# Patient Record
Sex: Female | Born: 1960
Health system: Southern US, Community
[De-identification: ages and names within clinical notes are randomized; demographics above are authoritative.]

## PROBLEM LIST (undated history)

## (undated) DIAGNOSIS — R112 Nausea with vomiting, unspecified: Secondary | ICD-10-CM

## (undated) DIAGNOSIS — E079 Disorder of thyroid, unspecified: Secondary | ICD-10-CM

## (undated) DIAGNOSIS — Z9889 Other specified postprocedural states: Secondary | ICD-10-CM

## (undated) DIAGNOSIS — E0590 Thyrotoxicosis, unspecified without thyrotoxic crisis or storm: Secondary | ICD-10-CM

## (undated) DIAGNOSIS — F419 Anxiety disorder, unspecified: Secondary | ICD-10-CM

## (undated) DIAGNOSIS — R000 Tachycardia, unspecified: Secondary | ICD-10-CM

## (undated) DIAGNOSIS — R569 Unspecified convulsions: Secondary | ICD-10-CM

## (undated) DIAGNOSIS — R87619 Unspecified abnormal cytological findings in specimens from cervix uteri: Secondary | ICD-10-CM

## (undated) DIAGNOSIS — J449 Chronic obstructive pulmonary disease, unspecified: Secondary | ICD-10-CM

## (undated) DIAGNOSIS — D649 Anemia, unspecified: Secondary | ICD-10-CM

## (undated) DIAGNOSIS — H538 Other visual disturbances: Secondary | ICD-10-CM

## (undated) DIAGNOSIS — H6690 Otitis media, unspecified, unspecified ear: Secondary | ICD-10-CM

## (undated) DIAGNOSIS — R51 Headache: Secondary | ICD-10-CM

## (undated) DIAGNOSIS — Z87898 Personal history of other specified conditions: Secondary | ICD-10-CM

## (undated) DIAGNOSIS — R002 Palpitations: Secondary | ICD-10-CM

## (undated) DIAGNOSIS — H9201 Otalgia, right ear: Secondary | ICD-10-CM

## (undated) HISTORY — DX: Thyrotoxicosis, unspecified without thyrotoxic crisis or storm: E05.90

## (undated) HISTORY — DX: Palpitations: R00.2

## (undated) HISTORY — PX: INNER EAR SURGERY: SHX679

## (undated) HISTORY — DX: Otalgia, right ear: H92.01

## (undated) HISTORY — DX: Otitis media, unspecified, unspecified ear: H66.90

## (undated) HISTORY — DX: Other visual disturbances: H53.8

## (undated) HISTORY — DX: Personal history of other specified conditions: Z87.898

## (undated) HISTORY — PX: CERVICAL CONE BIOPSY: SUR198

## (undated) HISTORY — DX: Headache: R51

---

## 1991-07-05 HISTORY — PX: TUBAL LIGATION: SHX77

## 1997-09-20 ENCOUNTER — Emergency Department (HOSPITAL_COMMUNITY): Admission: EM | Admit: 1997-09-20 | Discharge: 1997-09-20 | Payer: Self-pay | Admitting: Internal Medicine

## 1997-09-23 ENCOUNTER — Inpatient Hospital Stay (HOSPITAL_COMMUNITY): Admission: EM | Admit: 1997-09-23 | Discharge: 1997-09-26 | Payer: Self-pay | Admitting: Internal Medicine

## 1998-08-19 ENCOUNTER — Encounter: Payer: Self-pay | Admitting: Emergency Medicine

## 1998-08-19 ENCOUNTER — Emergency Department (HOSPITAL_COMMUNITY): Admission: EM | Admit: 1998-08-19 | Discharge: 1998-08-19 | Payer: Self-pay | Admitting: Emergency Medicine

## 1998-08-21 ENCOUNTER — Emergency Department (HOSPITAL_COMMUNITY): Admission: EM | Admit: 1998-08-21 | Discharge: 1998-08-21 | Payer: Self-pay | Admitting: Emergency Medicine

## 1998-09-04 ENCOUNTER — Emergency Department (HOSPITAL_COMMUNITY): Admission: EM | Admit: 1998-09-04 | Discharge: 1998-09-04 | Payer: Self-pay | Admitting: Emergency Medicine

## 1998-11-16 ENCOUNTER — Encounter: Payer: Self-pay | Admitting: Family Medicine

## 1998-11-16 ENCOUNTER — Ambulatory Visit (HOSPITAL_COMMUNITY): Admission: RE | Admit: 1998-11-16 | Discharge: 1998-11-16 | Payer: Self-pay | Admitting: Family Medicine

## 1999-06-16 ENCOUNTER — Emergency Department (HOSPITAL_COMMUNITY): Admission: EM | Admit: 1999-06-16 | Discharge: 1999-06-16 | Payer: Self-pay

## 1999-11-25 ENCOUNTER — Emergency Department (HOSPITAL_COMMUNITY): Admission: EM | Admit: 1999-11-25 | Discharge: 1999-11-25 | Payer: Self-pay | Admitting: Emergency Medicine

## 1999-11-25 ENCOUNTER — Encounter: Payer: Self-pay | Admitting: Emergency Medicine

## 2000-01-12 ENCOUNTER — Emergency Department (HOSPITAL_COMMUNITY): Admission: EM | Admit: 2000-01-12 | Discharge: 2000-01-12 | Payer: Self-pay | Admitting: Emergency Medicine

## 2000-07-23 ENCOUNTER — Inpatient Hospital Stay (HOSPITAL_COMMUNITY): Admission: EM | Admit: 2000-07-23 | Discharge: 2000-07-25 | Payer: Self-pay

## 2000-07-23 ENCOUNTER — Encounter: Payer: Self-pay | Admitting: Emergency Medicine

## 2000-07-24 ENCOUNTER — Encounter: Payer: Self-pay | Admitting: Emergency Medicine

## 2000-07-25 ENCOUNTER — Encounter: Payer: Self-pay | Admitting: Internal Medicine

## 2001-03-31 ENCOUNTER — Encounter: Payer: Self-pay | Admitting: Emergency Medicine

## 2001-03-31 ENCOUNTER — Inpatient Hospital Stay (HOSPITAL_COMMUNITY): Admission: EM | Admit: 2001-03-31 | Discharge: 2001-04-04 | Payer: Self-pay | Admitting: Emergency Medicine

## 2001-04-03 ENCOUNTER — Encounter: Payer: Self-pay | Admitting: Family Medicine

## 2001-04-26 ENCOUNTER — Encounter: Admission: RE | Admit: 2001-04-26 | Discharge: 2001-04-26 | Payer: Self-pay | Admitting: Family Medicine

## 2001-07-14 ENCOUNTER — Emergency Department (HOSPITAL_COMMUNITY): Admission: EM | Admit: 2001-07-14 | Discharge: 2001-07-14 | Payer: Self-pay | Admitting: Emergency Medicine

## 2001-07-14 ENCOUNTER — Encounter: Payer: Self-pay | Admitting: Emergency Medicine

## 2003-12-29 ENCOUNTER — Emergency Department (HOSPITAL_COMMUNITY): Admission: EM | Admit: 2003-12-29 | Discharge: 2003-12-29 | Payer: Self-pay | Admitting: Emergency Medicine

## 2004-06-13 ENCOUNTER — Emergency Department (HOSPITAL_COMMUNITY): Admission: EM | Admit: 2004-06-13 | Discharge: 2004-06-13 | Payer: Self-pay | Admitting: Emergency Medicine

## 2004-10-18 ENCOUNTER — Emergency Department (HOSPITAL_COMMUNITY): Admission: EM | Admit: 2004-10-18 | Discharge: 2004-10-18 | Payer: Self-pay | Admitting: Emergency Medicine

## 2005-10-31 ENCOUNTER — Emergency Department (HOSPITAL_COMMUNITY): Admission: EM | Admit: 2005-10-31 | Discharge: 2005-10-31 | Payer: Self-pay | Admitting: Emergency Medicine

## 2006-04-12 ENCOUNTER — Emergency Department (HOSPITAL_COMMUNITY): Admission: EM | Admit: 2006-04-12 | Discharge: 2006-04-12 | Payer: Self-pay | Admitting: Family Medicine

## 2007-08-27 ENCOUNTER — Emergency Department (HOSPITAL_COMMUNITY): Admission: EM | Admit: 2007-08-27 | Discharge: 2007-08-27 | Payer: Self-pay | Admitting: Emergency Medicine

## 2007-12-04 ENCOUNTER — Emergency Department (HOSPITAL_COMMUNITY): Admission: EM | Admit: 2007-12-04 | Discharge: 2007-12-04 | Payer: Self-pay | Admitting: Emergency Medicine

## 2009-01-19 ENCOUNTER — Emergency Department (HOSPITAL_COMMUNITY): Admission: EM | Admit: 2009-01-19 | Discharge: 2009-01-19 | Payer: Self-pay | Admitting: Emergency Medicine

## 2010-03-15 ENCOUNTER — Emergency Department (HOSPITAL_COMMUNITY)
Admission: EM | Admit: 2010-03-15 | Discharge: 2010-03-15 | Payer: Self-pay | Source: Home / Self Care | Admitting: Emergency Medicine

## 2010-06-09 LAB — COMPREHENSIVE METABOLIC PANEL
ALT: 14 U/L (ref 0–35)
AST: 19 U/L (ref 0–37)
Alkaline Phosphatase: 58 U/L (ref 39–117)
CO2: 25 mEq/L (ref 19–32)
Chloride: 105 mEq/L (ref 96–112)
Creatinine, Ser: 0.61 mg/dL (ref 0.4–1.2)
GFR calc Af Amer: >60 mL/min (ref >60)
GFR calc non Af Amer: >60 mL/min (ref >60)
Potassium: 3.4 mEq/L — ABNORMAL LOW (ref 3.5–5.1)
Sodium: 137 mEq/L (ref 135–145)
Total Bilirubin: 0.4 mg/dL (ref 0.3–1.2)

## 2010-06-09 LAB — URINALYSIS, ROUTINE W REFLEX MICROSCOPIC
Glucose, UA: NEGATIVE mg/dL
Protein, ur: NEGATIVE mg/dL
pH: 6 (ref 5.0–8.0)

## 2010-06-09 LAB — URINE MICROSCOPIC-ADD ON

## 2010-06-09 LAB — DIFFERENTIAL
Basophils Absolute: 0 10*3/uL (ref 0.0–0.1)
Basophils Relative: 1 % (ref 0–1)
Eosinophils Absolute: 0.1 10*3/uL (ref 0.0–0.7)
Eosinophils Relative: 2 % (ref 0–5)

## 2010-06-09 LAB — CBC
MCV: 89.6 fL (ref 78.0–100.0)
RBC: 4.08 MIL/uL (ref 3.87–5.11)
WBC: 6.5 10*3/uL (ref 4.0–10.5)

## 2010-06-09 LAB — URINE CULTURE: Colony Count: 75000

## 2010-07-23 NOTE — Discharge Summary (Signed)
[place]. [facility]  Patient:    Jennifer Stanton, Jennifer Stanton Visit Number: [id number] MRN: [id number]          Service Type: MED Location: [phone] Attending Physician:  Doneta Public Dictated by:   Lucille Passy, M.D. Admit Date:  03/31/2001 Discharge Date: 04/04/2001   CC:         [facility] in [place], primary doctor             Dr. London Sheer, ophthalmology, [place], [place]                           Discharge Summary  DATE OF BIRTH:  [birth year].  DISCHARGE DIAGNOSES: 1. Pyelonephritis. 2. Nausea and vomiting. 3. Diplopia. 4. Tobacco abuse.  DISCHARGE MEDICATIONS: 1. Bactrim DS one p.o. b.i.d. x9 days. 2. Phenergan 25 mg suppositories one p.r. q.4h. p.r.n. nausea and vomiting.  DISCHARGE INSTRUCTIONS:  Activity as tolerated.  The patient as instructed to walk with assistance if she is feeling lightheaded.  She was advised to quit smoking as this is generally bad for her health.  Diet as tolerated.  She was advised to return to [facility] or the [employer] Emergency Room if she is unable to keep any food or liquid down or for increasing pain.  FOLLOW-UP: 1. [facility] within two weeks. 2. The patient was advised to go and see her eye doctor, Dr. London Sheer,    within two weeks of discharge to insure that her vision is normal and that    she has the correct prescription for her glasses.  BRIEF HOSPITAL COURSE:  [name] is a 50 year old Caucasian female who presented to the [place] ER with a four-day history of nausea and vomiting as well as one episode of emesis on the day of admission when she became dizzy and fell in the bathroom, hitting her head on the floor on her right temporal area.  She also complained of low back pain over the last 24 hours prior to admission.  See dictated history and physical for full details.  ADMISSION LABS:  Temperature on admission  103. Potassium low at 3.2, other electrolytes and liver function tests within normal limits.  Amylase and lipase within normal limits.  Hemoglobin 9.2, MCV 88.6, white blood cell count 9.2 with an elevated neutrophil percent at 79.  Urinalysis which was a clean catch revealed rare epithelial cells, 20 to 50 white blood cells, many bacteria, positive leukocyte esterase, and nitrites.  Urine pregnancy test negative.  Admission head CT was negative.  Admission chest x-ray was negative.  #1 - PYELONEPHRITIS:  The patient was initially placed on IV Cipro which was later changed to oral Bactrim when the patients urine culture revealed greater than 100,000 colonies of E. coli which was pan sensitive.  She completed five days of antibiotics during the hospitalization and will complete 14-day total course on discharge. She continued to have only low grade fevers during hospitalization.  Her white blood cell count decreased to 5.6 on the day of discharge. She was also treated with IV fluids.  Blood cultures were negative by the time of dictation. She had excellent urine output throughout admission.  By the day of discharge she was tolerating a regular diet with only a small amount of emesis and was off IV fluids and taking oral antibiotics and was therefore ready for discharge.  #2 - NAUSEA AND  VOMITING:  The patient states she has had problems with nausea and vomiting over the past two weeks but has never had problems before that. Her episodes of emesis are only productive of a small amount of stomach contents and she had excellent urine output throughout hospitalization even when her IV fluids were stopped.  Her nausea and vomiting was overall adequately controlled with Phenergan suppositories which she will be given on discharge.  Her abdominal exam revealed only CVA tenderness with moderate left-sided abdominal pain during hospitalization and her liver function tests on admission were all  within normal limits.  This nausea and vomiting may be residual effect of her pyelonephritis or it may also likely be functional or psychosomatic.  #3 - DIPLOPIA:  The patient reported double vision in her right eye since her syncopal episode prior to coming to the hospital.  She did hit the right side of her head during that syncopal episode and complained of a frontal headache for the first several days of admission.  A head CT on admission was negative as mentioned above and a repeat head CT on hospital day #4 was also negative for any intracranial bleed.  An ophthalmology consult was obtained from Guadelupe Sabin, M.D., who examined and evaluated the patient.  He did not appreciate any retinal tear or detachment and simply found that her vision is 21/50 in her right eye and 20/30 in her left eye.  He did not appreciate any ocular trauma and recommended that the patient see her local eye doctor, Dr. London Sheer, for follow-up after discharge.  The patient did not have her glasses during hospitalization and this may account for some of her double vision.  #4 - TOBACCO ABUSE:  The patient was advised to quit smoking during hospitalization.  DISCHARGE LABS:  Sodium 138, potassium 4.4, chloride 100, CO2 31, BUN 10, creatinine 0.9, glucose 101, calcium 9.6.  White blood cell count 5.6, hemoglobin 12.5, platelets 397; differential negative. Dictated by:   Lucille Passy, M.D. Attending Physician:  Doneta Public DD:  04/04/01 TD:  04/04/01 Job: [id number]
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
## 2010-07-23 NOTE — Discharge Summary (Signed)
[place]. [facility]  Patient:    Jennifer Stanton, Jennifer Stanton                        MRN: [id number] Adm. Date:  [id number] Disc. Date: [id number] Attending:  Alfonso Ramus Dictator:   Janyce Llanos, M.D. CC:         Edwena Felty. Ashley Royalty, M.D.   Discharge Summary  DISCHARGE DIAGNOSES: 1. Left ovarian cyst with nausea and vomiting. 2. Polysubstance abuse including cocaine, marijuana, tobacco, and alcohol. 3. History of depression. 4. History of overdose on amitriptyline in the past. 5. Status post bilateral tubal ligation.  DISCHARGE MEDICATIONS:  Ms. Sherlin refused any discharge medications.  PROCEDURE: 1. Transvaginal ultrasound dated Jul 23, 2000, which revealed a possibly    complex cyst on the left ovary measuring 2 cm.  Dopplers were negative for    torsion and follow-up is recommended after two menstrual cycles to    reevaluate this cyst. 2. Abdominal and pelvic CT dated Jul 23, 2000, which again revealed the    left ovarian cyst and some moderate periportal edema of uncertain clinical    significance.  Otherwise benign study.  Consultation was obtained over the phone with OB/GYN doctor, Edwena Felty. Ashley Royalty, M.D.  HISTORY OF PRESENT ILLNESS:  Jennifer Stanton is a 50 year old white female with a history of substance abuse and bilateral tubal ligation, who presents to the ED on May 19, with acute onset of left lower quadrant pain on the morning of admission.  [name] was awakened from sleep with stabbing left lower quadrant pain that radiates to her groin immediately followed by nausea and vomiting.  The pain is now constant and pressure-type pain with intermittent sharp, stabbing pain.  She had one normal bowel movement the day of admission without melena or hematochezia.  She finished her last menstrual cycle 10 days ago, but restarted vaginal bleeding on the day of admission.  This bleeding has been mild to moderate in nature, going through a pad every  four to six hours.  She gets her routine health care at [facility] with her last Pap smear in December of 2001.  She has no history of similar episodes, no history of ovarian cyst, no history of sexually transmitted diseases, and she reports that she is HIV negative.  PAST MEDICAL HISTORY:  As noted above.  MEDICATIONS:  None.  ALLERGIES:  CODEINE which causes her neck to swell up.  FAMILY HISTORY:  She has a brother who recently passed away with hemachromatosis.  SOCIAL HISTORY:  The patient lives in [place] with her father.  She is divorced and has three children with shared custody.  She also reports a history of substance abuse, but denies any recent cocaine use.  She drinks occasionally now, but used to drink heavily in the past up until 1993.  On the night prior to admission she had three alcoholic beverages.  She smokes 1/2 pack per day.  REVIEW OF SYSTEMS:  Noncontributory.  PHYSICAL EXAMINATION:  VITAL SIGNS: Temperature 97.3, heart rate 62.  Initial blood pressure after receiving IV Dilaudid was 77/62 and this quickly responded to rehydration and recheck 93/52.  Respirations 22.  100% on room air.  GENERAL: This patient is in mild to moderate distress with cramping and doubling over every two to five minutes.  HEENT: Reveals partial dentures, status post DVC but otherwise benign.  HEART: Regular rate and rhythm without murmurs, rubs, or gallops.  No JVD.  LUNGS: Clear to auscultation bilaterally. GASTROINTESTINAL: Positive bowel sounds, soft with a very tender left lower quadrant, no rebound, no guarding, no palpable mass.  No hepatosplenomegaly. The entire rest of the abdomen is nontender.  EXTREMITIES: Without clubbing, cyanosis, or edema.  2+ palpable dorsalis pedis pulse bilaterally. NEUROLOGICAL: Awake and oriented x 3, nonfocal.  PELVIC: Per ED physician revealed left adnexal mass and tenderness with mild vaginal bleeding.  LABORATORY DATA:  Urine and serum  pregnancy test which were both negative. Urinalysis completely within normal limits.  Chemistries all within normal limits.  White blood cell count 11.7, hemoglobin 14.9, and platelets 270.  HOSPITAL COURSE:  #1 - Acute left lower quadrant pain related to ovarian cyst.  We admitted Jennifer Stanton for observation and pain control as well as IV rehydration.  She did well with this and on hospital day #2 with vigorous hydration, her hemoglobin went from 14.9 to 10.9.  We had some concern about her vaginal bleeding being a contributing factor to his hemoglobin drop, however, further checks of her hemoglobin all were in the 11 range, and I suspect that drastic drop in hemoglobin may have been partially due to libara but also secondary to vigorous hydration.  Her symptoms were partially controlled with IV morphine and Phenergan.  We had some difficulty converting to a p.o. regimen, as [name] reports that Percocet makes her itch and ibuprofen and Tylenol were not controlling her pain.  Telephone consultation was obtained from Dr. Ashley Royalty who recommended that we continue to watch her hemoglobin and arranged for follow-up in her clinic.  I presented Ms. Treloar with this information and she became very upset, reporting that we were not doing anything for her ovarian cyst and that she wanted immediate surgery to have this removed.  I explained the possible risks including mortality risks involved with immediate surgery and explained that ovarian cysts generally regress on their own, however, [name] continues to request immediate surgery for her ovarian cyst.  Arrangements were made to have [name] follow-up in Dr. Ashley Royalty clinic actually on the day of discharge at 10 a.m.  As of the time of this dictation I know that [name] has already made it to this appointment. Gonorrhea and Chlamydia probes were obtained on admission and they were both negative.  #2 - Polysubstance abuse.   [name] denies any recent cocaine use despite a  finding of cocaine in her urine drug screen.  She does admit to using marijuana frequently, however, refused any help with substance abuse counseling.  DISCHARGE LABORATORY DATA:  White count 7.4, hemoglobin 11.8, platelets 213. Chemistries all within normal limits and liver function tests all within normal limits.  DISPOSITION:  To home.  FOLLOW-UP:  With Dr. Ashley Royalty today.  CONDITION ON DISCHARGE:  Stable. DD:  07/25/00 TD:  07/25/00 Job: [id number]

## 2010-12-02 LAB — URINALYSIS, ROUTINE W REFLEX MICROSCOPIC
Hgb urine dipstick: NEGATIVE
Protein, ur: NEGATIVE
Specific Gravity, Urine: 1.01
Urobilinogen, UA: 0.2

## 2010-12-02 LAB — POCT I-STAT, CHEM 8
Calcium, Ion: 1.2
Chloride: 105
Glucose, Bld: 93
HCT: 38
Hemoglobin: 12.9
TCO2: 24

## 2010-12-02 LAB — CULTURE, BLOOD (ROUTINE X 2): Culture: NO GROWTH

## 2010-12-02 LAB — CBC
MCHC: 34.7
Platelets: 209
RBC: 4.01
WBC: 8.5

## 2010-12-02 LAB — DIFFERENTIAL
Basophils Relative: 1
Lymphs Abs: 2.3
Monocytes Relative: 10
Neutro Abs: 5.2
Neutrophils Relative %: 61

## 2010-12-02 LAB — URINE MICROSCOPIC-ADD ON

## 2011-05-24 ENCOUNTER — Emergency Department (HOSPITAL_COMMUNITY)
Admission: EM | Admit: 2011-05-24 | Discharge: 2011-05-24 | Disposition: A | Discharge status: Home / Self Care | Payer: Self-pay | Attending: Emergency Medicine | Admitting: Emergency Medicine

## 2011-05-24 ENCOUNTER — Encounter (HOSPITAL_COMMUNITY): Payer: Self-pay | Admitting: Emergency Medicine

## 2011-05-24 DIAGNOSIS — H6691 Otitis media, unspecified, right ear: Secondary | ICD-10-CM

## 2011-05-24 DIAGNOSIS — R51 Headache: Secondary | ICD-10-CM | POA: Insufficient documentation

## 2011-05-24 DIAGNOSIS — H6690 Otitis media, unspecified, unspecified ear: Secondary | ICD-10-CM | POA: Insufficient documentation

## 2011-05-24 MED ORDER — SUMATRIPTAN SUCCINATE 50 MG PO TABS: 50.0000 mg | ORAL_TABLET | ORAL | Status: DC | PRN

## 2011-05-24 MED ORDER — AMOXICILLIN 500 MG PO CAPS: 500.0000 mg | ORAL_CAPSULE | Freq: Three times a day (TID) | ORAL | Status: AC

## 2011-05-24 NOTE — Discharge Instructions (Signed)
Headache Headaches are caused by many different problems. Most commonly, headache is caused by muscle tension from an injury, fatigue, or emotional upset. Excessive muscle contractions in the scalp and neck result in a headache that often feels like a tight band around the head. Tension headaches often have areas of tenderness over the scalp and the back of the neck. These headaches may last for hours, days, or longer, and some may contribute to migraines in those who have migraine problems. Migraines usually cause a throbbing headache, which is made worse by activity. Sometimes only one side of the head hurts. Nausea, vomiting, eye pain, and avoidance of food are common with migraines. Visual symptoms such as light sensitivity, blind spots, or flashing lights may also occur. Loud noises may worsen migraine headaches. Many factors may cause migraine headaches:  Emotional stress, lack of sleep, and menstrual periods.   Alcohol and some drugs (such as birth control pills).   Diet factors (fasting, caffeine, food preservatives, chocolate).   Environmental factors (weather changes, bright lights, odors, smoke).  Other causes of headaches include minor injuries to the head. Arthritis in the neck; problems with the jaw, eyes, ears, or nose are also causes of headaches. Allergies, drugs, alcohol, and exposure to smoke can also cause moderate headaches. Rebound headaches can occur if someone uses pain medications for a long period of time and then stops. Less commonly, blood vessel problems in the neck and brain (including stroke) can cause various types of headache. Treatment of headaches includes medicines for pain and relaxation. Ice packs or heat applied to the back of the head and neck help some people. Massaging the shoulders, neck and scalp are often very useful. Relaxation techniques and stretching can help prevent these headaches. Avoid alcohol and cigarette smoking as these tend to make headaches  worse. Please see your caregiver if your headache is not better in 2 days.  SEEK IMMEDIATE MEDICAL CARE IF:   You develop a high fever, chills, or repeated vomiting.   You faint or have difficulty with vision.   You develop unusual numbness or weakness of your arms or legs.   Relief of pain is inadequate with medication, or you develop severe pain.   You develop confusion, or neck stiffness.   You have a worsening of a headache or do not obtain relief.  Document Released: 02/21/2005 Document Revised: 02/10/2011 Document Reviewed: 08/17/2006 [facility] Patient Information 2012 [place], [place].  Draining Ear Ear wax, pus, blood and other fluids are examples of the different types of drainage from ears. Drops or cream may be needed to lessen the itching which may occur with ear drainage. CAUSES   Skin irritations in the ear.   Ear infection.   Swimmer's ear.   Ruptured eardrum.   Foreign object in the ear canal.   Sudden pressure changes.   Head injury.  HOME CARE INSTRUCTIONS   Only take over-the-counter or prescription medicines for pain, fever, or discomfort as directed by your caregiver.   Do not rub the ear canal with cotton-tipped swabs.   Do not swim until your caregiver says it is okay.   Before you take a shower, cover a cotton ball with petroleum jelly to keep water out.   Limit exposure to smoke. Secondhand smoke can increase the chance for ear infections.   Keep up with immunizations.   Wash your hands well.   Keep all follow-up appointments to examine the ear and evaluate hearing.  SEEK MEDICAL CARE IF:   You  have increased drainage.   You have ear pain, a fever, or drainage that is not getting better after 48 hours of antibiotics.   You are unusually tired.  SEEK IMMEDIATE MEDICAL CARE IF:  You have severe ear pain or headache.   The patient is older than 3 months with a rectal or oral temperature of 102 F (38.9 C) or higher.   The patient is  77 months old or younger with a rectal temperature of 100.4 F (38 C) or higher.   You vomit.   You feel dizzy.   You have a seizure.   You have new hearing loss.  MAKE SURE YOU:   Understand these instructions.   Will watch your condition.   Will get help right away if you are not doing well or get worse.  Document Released: 02/21/2005 Document Revised: 02/10/2011 Document Reviewed: 12/25/2008 [facility] Patient Information 2012 [place], [place].

## 2011-05-24 NOTE — ED Provider Notes (Signed)
Medical screening examination/treatment/procedure(s) were performed by non-physician practitioner and as supervising physician I was immediately available for consultation/collaboration.   Ellenor Wisniewski M Kyi Romanello, MD 05/24/11 [id number] 

## 2011-05-24 NOTE — ED Provider Notes (Signed)
History     CSN: [id number]  Arrival date & time 05/24/11  1345   First MD Initiated Contact with Patient 05/24/11 1623      Chief Complaint  Patient presents with  . Headache  . Blurred Vision    (Consider location/radiation/quality/duration/timing/severity/associated sxs/prior treatment) HPI  51 year old female presents with chief complaints of headache and ear drainage. Patient states for the past week and a half she has been experiencing gradual onset of pain to the back of her head which radiates down to her back. Pain is described as throbbing sensation, with associated nausea, vomiting, blurred vision, body aches. Light and sounds does worsen her headache. Patient also notice a brown discharge to her right ear but denies any significant ear pain.  Patient denies sneezing, coughing, runny nose, chest pain, shortness of breath, abdominal pain, dysuria, or rash. She has been taking Aleve with minimal relief. Patient did decide to come to ED today due to her new blurry vision.  She denies changes in hearing.     History reviewed. No pertinent past medical history.  History reviewed. No pertinent past surgical history.  No family history on file.  History  Substance Use Topics  . Smoking status: Not on file  . Smokeless tobacco: Not on file  . Alcohol Use: Not on file    OB History    Grav Para Term Preterm Abortions TAB SAB Ect Mult Living                  Review of Systems  All other systems reviewed and are negative.    Allergies  Codeine  Home Medications  No current outpatient prescriptions on file.  BP 108/73  Pulse 79  Temp(Src) 98.8 F (37.1 C) (Oral)  Resp 18  SpO2 100%  Physical Exam  Nursing note and vitals reviewed. Constitutional: She appears well-developed and well-nourished. No distress.  HENT:  Head: Normocephalic and atraumatic.  Right Ear: Hearing normal. There is swelling. No tenderness. Tympanic membrane is bulging. A middle ear  effusion is present. No hemotympanum.  Left Ear: Hearing normal. A middle ear effusion is present.  Ears:  Nose: Nose normal.  Mouth/Throat: Uvula is midline, oropharynx is clear and moist and mucous membranes are normal.  Eyes: Conjunctivae and EOM are normal. Pupils are equal, round, and reactive to light.  Neck: Normal range of motion. Neck supple. No Brudzinski's sign and no Kernig's sign noted.       Minimal neck tenderness with range of motion,  With preauricular lymphadenopathy. No meningismal signs.  Neurological:       No focal neuro deficit.  Skin: No rash noted.  Psychiatric: She has a normal mood and affect. Her speech is normal and behavior is normal. Judgment normal.    ED Course  Procedures (including critical care time)  Labs Reviewed - No data to display No results found.   No diagnosis found.    MDM  Headache with some tenderness to neck no mention meningismal signs. Patient is afebrile with stable normal vital signs. Does not appear toxic. Visual acuity unremarkable. right TM is fluid-filled and bulging.  Will treats for Otitis Media with abx.  Recommend strict f/u if experiencing red flags findings.  Pt voice understanding.  Low suspicion for meningitis at this time.            Fayrene Helper, PA-C 05/24/11 1715

## 2011-05-24 NOTE — ED Notes (Signed)
Pt states that she has had a headache that radates to mid back with blurred vision, pt has drainage brown in color coming out of rt ear.

## 2011-05-24 NOTE — Progress Notes (Signed)
Pt listed as self pay with no insurance coverage Pt confirms she is self pay [place] resident.  [facility] coordinator spoke with her Pt offered [facility] services to assist with finding a [place] self pay provider.  Stated "what you are telling me is going in one ear and out the other" [facility] information left with pt Friend at beside

## 2011-11-14 ENCOUNTER — Encounter (HOSPITAL_COMMUNITY): Payer: Self-pay | Admitting: Family Medicine

## 2011-11-14 ENCOUNTER — Emergency Department (HOSPITAL_COMMUNITY)
Admission: EM | Admit: 2011-11-14 | Discharge: 2011-11-14 | Discharge status: Against Medical Advice | Payer: Self-pay | Attending: Emergency Medicine | Admitting: Emergency Medicine

## 2011-11-14 DIAGNOSIS — M79609 Pain in unspecified limb: Secondary | ICD-10-CM | POA: Insufficient documentation

## 2011-11-14 DIAGNOSIS — R51 Headache: Secondary | ICD-10-CM | POA: Insufficient documentation

## 2011-11-14 DIAGNOSIS — R42 Dizziness and giddiness: Secondary | ICD-10-CM | POA: Insufficient documentation

## 2011-11-14 DIAGNOSIS — R209 Unspecified disturbances of skin sensation: Secondary | ICD-10-CM | POA: Insufficient documentation

## 2011-11-14 HISTORY — DX: Unspecified convulsions: R56.9

## 2011-11-14 LAB — COMPREHENSIVE METABOLIC PANEL
ALT: 11 U/L (ref 0–35)
AST: 19 U/L (ref 0–37)
Albumin: 3.8 g/dL (ref 3.5–5.2)
CO2: 25 mEq/L (ref 19–32)
Chloride: 102 mEq/L (ref 96–112)
Creatinine, Ser: 0.62 mg/dL (ref 0.50–1.10)
GFR calc non Af Amer: >90 mL/min (ref >90)
Potassium: 4 mEq/L (ref 3.5–5.1)
Sodium: 137 mEq/L (ref 135–145)
Total Bilirubin: 0.3 mg/dL (ref 0.3–1.2)

## 2011-11-14 LAB — CBC WITH DIFFERENTIAL/PLATELET
Basophils Absolute: 0 10*3/uL (ref 0.0–0.1)
Basophils Relative: 1 % (ref 0–1)
HCT: 40.9 % (ref 36.0–46.0)
Lymphocytes Relative: 37 % (ref 12–46)
MCHC: 34.7 g/dL (ref 30.0–36.0)
Monocytes Absolute: 0.3 10*3/uL (ref 0.1–1.0)
Neutro Abs: 2.8 10*3/uL (ref 1.7–7.7)
Neutrophils Relative %: 53 % (ref 43–77)
Platelets: 257 10*3/uL (ref 150–400)
RDW: 12.6 % (ref 11.5–15.5)
WBC: 5.2 10*3/uL (ref 4.0–10.5)

## 2011-11-14 LAB — POCT I-STAT TROPONIN I

## 2011-11-14 NOTE — ED Notes (Signed)
Pt called x3 w/ no answer  

## 2011-11-14 NOTE — ED Notes (Signed)
Pt called x 2 w/ no answer. 

## 2011-11-14 NOTE — ED Notes (Signed)
Pt name called for the second time.

## 2011-11-14 NOTE — ED Notes (Signed)
Pt sts 3 days of HA, blurred vision, dizziness, left arm pain, numbness, weakness soft tissue swelling and localized tenderness also the left side of her tongue is swollen.

## 2011-11-14 NOTE — ED Notes (Signed)
Pt called x 1 w/ no answer.   

## 2012-08-02 ENCOUNTER — Emergency Department (HOSPITAL_COMMUNITY): Payer: Self-pay

## 2012-08-02 ENCOUNTER — Emergency Department (HOSPITAL_COMMUNITY)
Admission: EM | Admit: 2012-08-02 | Discharge: 2012-08-03 | Disposition: A | Discharge status: Home / Self Care | Payer: Self-pay | Attending: Emergency Medicine | Admitting: Emergency Medicine

## 2012-08-02 DIAGNOSIS — S93601A Unspecified sprain of right foot, initial encounter: Secondary | ICD-10-CM

## 2012-08-02 DIAGNOSIS — Z8669 Personal history of other diseases of the nervous system and sense organs: Secondary | ICD-10-CM | POA: Insufficient documentation

## 2012-08-02 DIAGNOSIS — Y9389 Activity, other specified: Secondary | ICD-10-CM | POA: Insufficient documentation

## 2012-08-02 DIAGNOSIS — S93609A Unspecified sprain of unspecified foot, initial encounter: Secondary | ICD-10-CM | POA: Insufficient documentation

## 2012-08-02 DIAGNOSIS — Y990 Civilian activity done for income or pay: Secondary | ICD-10-CM | POA: Insufficient documentation

## 2012-08-02 DIAGNOSIS — Y9289 Other specified places as the place of occurrence of the external cause: Secondary | ICD-10-CM | POA: Insufficient documentation

## 2012-08-02 DIAGNOSIS — X500XXA Overexertion from strenuous movement or load, initial encounter: Secondary | ICD-10-CM | POA: Insufficient documentation

## 2012-08-02 MED ORDER — OXYCODONE-ACETAMINOPHEN 5-325 MG PO TABS
2.0000 | ORAL_TABLET | Freq: Once | ORAL | Status: AC
  Administered 2012-08-02: 2 via ORAL
  Filled 2012-08-02: qty 2

## 2012-08-02 NOTE — ED Provider Notes (Signed)
History  This chart was scribed for non-physician practitioner, Earley Favor FNP, working with April Smitty Cords, MD by Ardeen Jourdain, ED Scribe. This patient was seen in room TR05C/TR05C and the patient's care was started at 2330.  CSN: [id number]  Arrival date & time 08/02/12  2159   First MD Initiated Contact with Patient 08/02/12 2330      Chief Complaint  Patient presents with  . Ankle Pain     The history is provided by the patient. No language interpreter was used.    HPI Comments: Jennifer Stanton is a 52 y.o. female who presents to the Emergency Department complaining of sudden onset, gradually worsening, constant left foot pain that began 2 weeks ago. Pt states she was stepping off a box at work when the pain began. She states she has done this action "hundreds of times." She states the pain radiates up her leg. She states the pain is aggravated by standing and ambulation. She states she is able to move her toes. She denies any other injuries or symptoms at this time. She reports using soaks with epsom salts with no relief.   Past Medical History  Diagnosis Date  . Seizures     No past surgical history on file.  No family history on file.  History  Substance Use Topics  . Smoking status: Never Smoker   . Smokeless tobacco: Not on file  . Alcohol Use: Yes   No OB history available.   Review of Systems  Musculoskeletal: Positive for arthralgias.       Left ankle pain  All other systems reviewed and are negative.    Allergies  Codeine  Home Medications   Current Outpatient Rx  Name  Route  Sig  Dispense  Refill  . naproxen sodium (ANAPROX) 220 MG tablet   Oral   Take 440 mg by mouth 2 (two) times daily as needed (for pain).         . Tetrahydrozoline HCl (VISINE OP)   Both Eyes   Place 1 drop into both eyes daily.           Triage Vitals: BP 124/63  Pulse 84  Temp(Src) 98.2 F (36.8 C) (Oral)  Resp 16  SpO2 97%  Physical Exam   Nursing note and vitals reviewed. Constitutional: She is oriented to person, place, and time. She appears well-developed and well-nourished. No distress.  HENT:  Head: Normocephalic and atraumatic.  Eyes: EOM are normal. Pupils are equal, round, and reactive to light.  Neck: Normal range of motion. Neck supple. No tracheal deviation present.  Cardiovascular: Normal rate.   Pulmonary/Chest: Effort normal. No respiratory distress.  Abdominal: Soft. She exhibits no distension.  Musculoskeletal: Normal range of motion. She exhibits tenderness. She exhibits no edema.  Pain over ligament from great toe to ankle. Mild discoloration and swelling above the heel and medial foot   Neurological: She is alert and oriented to person, place, and time.  Skin: Skin is warm and dry.  Psychiatric: She has a normal mood and affect. Her behavior is normal.    ED Course  Procedures (including critical care time)  DIAGNOSTIC STUDIES: Oxygen Saturation is 97% on room air, normal by my interpretation.    COORDINATION OF CARE:  11:41 PM-Discussed treatment plan which includes pain medication, x-rays of left ankle and left foot with pt at bedside and pt agreed to plan.    Labs Reviewed - No data to display Dg Ankle Complete Left  08/02/2012   *  RADIOLOGY REPORT*  Clinical Data: Status post inversion injury, with medial left ankle pain.  LEFT ANKLE COMPLETE - 3+ VIEW  Comparison: None.  Findings: There is no evidence of fracture or dislocation.  The ankle mortise is intact; the interosseous space is within normal limits.  No talar tilt or subluxation is seen.  The joint spaces are preserved.  No significant soft tissue abnormalities are seen.  IMPRESSION: No evidence of fracture or dislocation.   Original Report Authenticated By: Tonia Ghent, M.D.     No diagnosis found.    MDM   Ace wrap has been placed.  Patient has been given crutches, prescription for Vicodin      I personally performed the  services described in this documentation, which was scribed in my presence. The recorded information has been reviewed and is accurate.  Arman Filter, NP 08/03/12 [phone]

## 2012-08-02 NOTE — ED Notes (Signed)
Hurt left ankle xx 2 weeks ago while at work when stepping off a box. Shooting pain on rt. Medial side of foot, radiating up leg. No swelling. Cms intact.

## 2012-08-03 ENCOUNTER — Emergency Department (HOSPITAL_COMMUNITY): Payer: Self-pay

## 2012-08-03 MED ORDER — ONDANSETRON 4 MG PO TBDP
4.0000 mg | ORAL_TABLET | Freq: Once | ORAL | Status: AC
  Administered 2012-08-03: 4 mg via ORAL
  Filled 2012-08-03: qty 1

## 2012-08-03 MED ORDER — HYDROCODONE-ACETAMINOPHEN 5-325 MG PO TABS: 1.0000 | ORAL_TABLET | Freq: Three times a day (TID) | ORAL | Status: DC | PRN

## 2012-08-03 NOTE — ED Provider Notes (Signed)
Medical screening examination/treatment/procedure(s) were performed by non-physician practitioner and as supervising physician I was immediately available for consultation/collaboration.  Kenyetta Wimbish K Sheniqua Carolan-Rasch, MD 08/03/12 0344 

## 2012-11-21 ENCOUNTER — Encounter (HOSPITAL_COMMUNITY): Payer: Self-pay | Admitting: Emergency Medicine

## 2012-11-21 ENCOUNTER — Emergency Department (HOSPITAL_COMMUNITY)
Admission: EM | Admit: 2012-11-21 | Discharge: 2012-11-21 | Disposition: A | Discharge status: Home / Self Care | Payer: Self-pay | Attending: Emergency Medicine | Admitting: Emergency Medicine

## 2012-11-21 DIAGNOSIS — R599 Enlarged lymph nodes, unspecified: Secondary | ICD-10-CM | POA: Insufficient documentation

## 2012-11-21 DIAGNOSIS — H9210 Otorrhea, unspecified ear: Secondary | ICD-10-CM | POA: Insufficient documentation

## 2012-11-21 DIAGNOSIS — Z8669 Personal history of other diseases of the nervous system and sense organs: Secondary | ICD-10-CM | POA: Insufficient documentation

## 2012-11-21 DIAGNOSIS — R05 Cough: Secondary | ICD-10-CM | POA: Insufficient documentation

## 2012-11-21 DIAGNOSIS — R509 Fever, unspecified: Secondary | ICD-10-CM | POA: Insufficient documentation

## 2012-11-21 DIAGNOSIS — H9209 Otalgia, unspecified ear: Secondary | ICD-10-CM | POA: Insufficient documentation

## 2012-11-21 DIAGNOSIS — H6690 Otitis media, unspecified, unspecified ear: Secondary | ICD-10-CM | POA: Insufficient documentation

## 2012-11-21 DIAGNOSIS — Z87891 Personal history of nicotine dependence: Secondary | ICD-10-CM | POA: Insufficient documentation

## 2012-11-21 DIAGNOSIS — M436 Torticollis: Secondary | ICD-10-CM | POA: Insufficient documentation

## 2012-11-21 DIAGNOSIS — J029 Acute pharyngitis, unspecified: Secondary | ICD-10-CM | POA: Insufficient documentation

## 2012-11-21 DIAGNOSIS — R059 Cough, unspecified: Secondary | ICD-10-CM | POA: Insufficient documentation

## 2012-11-21 MED ORDER — HYDROCODONE-ACETAMINOPHEN 5-325 MG PO TABS
2.0000 | ORAL_TABLET | Freq: Once | ORAL | Status: AC
  Administered 2012-11-21: 2 via ORAL
  Filled 2012-11-21: qty 2

## 2012-11-21 MED ORDER — AMOXICILLIN 500 MG PO CAPS: 500.0000 mg | ORAL_CAPSULE | Freq: Three times a day (TID) | ORAL | Status: DC

## 2012-11-21 MED ORDER — NEOMYCIN-POLYMYXIN-HC 1 % OT SOLN
4.0000 [drp] | Freq: Four times a day (QID) | OTIC | Status: AC
  Administered 2012-11-21: 4 [drp] via OTIC
  Filled 2012-11-21: qty 10

## 2012-11-21 MED ORDER — HYDROCODONE-ACETAMINOPHEN 5-325 MG PO TABS: 1.0000 | ORAL_TABLET | Freq: Four times a day (QID) | ORAL | Status: DC | PRN

## 2012-11-21 NOTE — ED Notes (Signed)
Pt states she has had right ear pain and drainage x 2 weeks then developed sore throat 1 week ago. Tonsils enlarged but no exudate noted. Also c/o a tender nodule under right jaw.

## 2012-11-21 NOTE — ED Notes (Signed)
Sore throat and rt ear pain w/ drainage from left ear x several days

## 2012-11-21 NOTE — ED Provider Notes (Signed)
Medical screening examination/treatment/procedure(s) were performed by non-physician practitioner and as supervising physician I was immediately available for consultation/collaboration.    Vida Roller, MD 11/21/12 [phone]

## 2012-11-21 NOTE — ED Provider Notes (Signed)
CSN: [id number]     Arrival date & time 11/21/12  1007 History   This chart was scribed for non-physician practitioner Arthor Captain, PA-C, working with Vida Roller, MD by Dorothey Baseman, ED Scribe. This patient was seen in room TR06C/TR06C and the patient's care was started at 10:51 AM.    Chief Complaint  Patient presents with  . Otalgia  . Sore Throat   Patient is a 52 y.o. female presenting with ear pain and pharyngitis. The history is provided by the patient. No language interpreter was used.  Otalgia Location:  Right Severity:  Moderate Timing:  Constant Chronicity:  Recurrent Relieved by:  OTC medications Associated symptoms: cough, ear discharge, fever and sore throat   Cough:    Severity:  Mild   Timing:  Intermittent Fever:    Progression:  Resolved Sore throat:    Severity:  Moderate Sore Throat   HPI Comments: WILBA MUTZ is a 52 y.o. female who presents to the Emergency Department complaining of a knot to the right anterior neck onset 3-4 days ago with a sharp pain. She reports associated sore throat and right ear pain with malodorous fluid drainage. She states that she has had this type of symptom in the past, following an unspecified surgery and was given antibiotics without relief.  She states that she may have had a fever this morning, but did not measure her temperature, and reports an associated mild cough. She reports taking ibuprofen at home with mild, temporary relief. She states that she has not seen an ENT specialist.  Past Medical History  Diagnosis Date  . Seizures    History reviewed. No pertinent past surgical history. No family history on file. History  Substance Use Topics  . Smoking status: Former Games developer  . Smokeless tobacco: Not on file  . Alcohol Use: Yes   OB History   Grav Para Term Preterm Abortions TAB SAB Ect Mult Living                 Review of Systems  Constitutional: Positive for fever.  HENT: Positive for ear pain, sore  throat, neck stiffness and ear discharge.   Respiratory: Positive for cough.   All other systems reviewed and are negative.    Allergies  Codeine  Home Medications   Current Outpatient Rx  Name  Route  Sig  Dispense  Refill  . acetaminophen (TYLENOL) 500 MG tablet   Oral   Take 1,000 mg by mouth every 6 (six) hours as needed for pain. Has taken 6 total today.          Triage Vitals: BP 104/72  Pulse 84  Temp(Src) 98.2 F (36.8 C) (Oral)  Resp 20  Ht 5\' 6"  (1.676 m)  Wt 140 lb (63.504 kg)  BMI 22.61 kg/m2  SpO2 96%  Physical Exam  Nursing note and vitals reviewed. Constitutional: She is oriented to person, place, and time. She appears well-developed and well-nourished. No distress.  HENT:  Head: Normocephalic and atraumatic.  Left Ear: External ear normal.  Mouth/Throat: Oropharynx is clear and moist.  Preauricular adenopathy. Bulging right tympanic membrane with prulence behind it. No active drainage noted. Canal is swollen, but non-erythematous. Erythematous pharynx with some uvular swelling, but without deviation.  Eyes: Conjunctivae are normal.  Neck: Normal range of motion. Neck supple.  Right cervical tonsillar adenopathy. Mastoid tenderness to palpation.  Musculoskeletal: Normal range of motion.  Lymphadenopathy:    She has cervical adenopathy.  Neurological: She is  alert and oriented to person, place, and time.  Skin: Skin is warm and dry.  Psychiatric: She has a normal mood and affect. Her behavior is normal.    ED Course  Procedures (including critical care time)  DIAGNOSTIC STUDIES: Oxygen Saturation is 96% on room air, normal by my interpretation.    COORDINATION OF CARE: 10:57PM- Advised patient that there is an infection. Discussed treatment plan with patient at bedside and patient verbalized agreement.     Labs Review Labs Reviewed - No data to display Imaging Review No results found.  MDM   1. AOM (acute otitis media), right   2.  Pharyngitis    Patient seen in shared visit with Dr. Hyacinth Meeker.  Will treat for AOM/ pharyngitis. Do not suspect malignant OE, mastoiditis, meningitis. Patient is unable to afford augmentin. She will be given amoxicillin and norco. Polysporin otic given here in the ED. F/U with community health. Discussed return precautions.  I personally performed the services described in this documentation, which was scribed in my presence. The recorded information has been reviewed and is accurate.       Arthor Captain, PA-C 11/21/12 1130

## 2012-11-21 NOTE — ED Notes (Signed)
EAR DROPS ORDERED FROM PHARMACY.

## 2013-01-01 ENCOUNTER — Emergency Department (HOSPITAL_COMMUNITY)
Admission: EM | Admit: 2013-01-01 | Discharge: 2013-01-01 | Disposition: A | Discharge status: Home / Self Care | Payer: Self-pay | Attending: Emergency Medicine | Admitting: Emergency Medicine

## 2013-01-01 ENCOUNTER — Encounter (HOSPITAL_COMMUNITY): Payer: Self-pay | Admitting: Emergency Medicine

## 2013-01-01 DIAGNOSIS — S40029A Contusion of unspecified upper arm, initial encounter: Secondary | ICD-10-CM | POA: Insufficient documentation

## 2013-01-01 DIAGNOSIS — R209 Unspecified disturbances of skin sensation: Secondary | ICD-10-CM | POA: Insufficient documentation

## 2013-01-01 DIAGNOSIS — Y9289 Other specified places as the place of occurrence of the external cause: Secondary | ICD-10-CM | POA: Insufficient documentation

## 2013-01-01 DIAGNOSIS — IMO0002 Reserved for concepts with insufficient information to code with codable children: Secondary | ICD-10-CM | POA: Insufficient documentation

## 2013-01-01 DIAGNOSIS — W540XXA Bitten by dog, initial encounter: Secondary | ICD-10-CM | POA: Insufficient documentation

## 2013-01-01 DIAGNOSIS — Z87891 Personal history of nicotine dependence: Secondary | ICD-10-CM | POA: Insufficient documentation

## 2013-01-01 DIAGNOSIS — S41151A Open bite of right upper arm, initial encounter: Secondary | ICD-10-CM

## 2013-01-01 DIAGNOSIS — Z23 Encounter for immunization: Secondary | ICD-10-CM | POA: Insufficient documentation

## 2013-01-01 DIAGNOSIS — Y9301 Activity, walking, marching and hiking: Secondary | ICD-10-CM | POA: Insufficient documentation

## 2013-01-01 DIAGNOSIS — Z8669 Personal history of other diseases of the nervous system and sense organs: Secondary | ICD-10-CM | POA: Insufficient documentation

## 2013-01-01 MED ORDER — TETANUS-DIPHTH-ACELL PERTUSSIS 5-2.5-18.5 LF-MCG/0.5 IM SUSP
0.5000 mL | Freq: Once | INTRAMUSCULAR | Status: AC
  Administered 2013-01-01: 0.5 mL via INTRAMUSCULAR
  Filled 2013-01-01: qty 0.5

## 2013-01-01 MED ORDER — AMOXICILLIN-POT CLAVULANATE 875-125 MG PO TABS: 1.0000 | ORAL_TABLET | Freq: Two times a day (BID) | ORAL | Status: DC

## 2013-01-01 MED ORDER — HYDROCODONE-ACETAMINOPHEN 5-325 MG PO TABS: 1.0000 | ORAL_TABLET | ORAL | Status: DC | PRN

## 2013-01-01 MED ORDER — IBUPROFEN 400 MG PO TABS
800.0000 mg | ORAL_TABLET | Freq: Once | ORAL | Status: AC
  Administered 2013-01-01: 800 mg via ORAL
  Filled 2013-01-01: qty 2

## 2013-01-01 NOTE — ED Notes (Signed)
Presents with dog bite to left arm by a dog [employer]. Owners state vaccines are up to date. Bleeding controlled. Pt has name and number of owner and incident report.

## 2013-01-01 NOTE — ED Provider Notes (Signed)
CSN: [id number]     Arrival date & time 01/01/13  1932 History  This chart was scribed for non-physician practitioner Dierdre Forth, PA-C, working with Enid Skeens, MD by Dorothey Baseman, ED Scribe. This patient was seen in room TR08C/TR08C and the patient's care was started at 10:10 PM.    Chief Complaint  Patient presents with  . Animal Bite   Patient is a 52 y.o. female presenting with animal bite. The history is provided by the patient and medical records. No language interpreter was used.  Animal Bite Contact animal:  Dog Location:  Shoulder/arm Shoulder/arm injury location:  L upper arm Pain details:    Quality:  Dull   Severity:  Mild   Timing:  Constant Incident location: [employer]. Provoked: unprovoked   Animal's rabies vaccination status:  Up to date Tetanus status:  Unknown Relieved by:  None tried Worsened by:  Nothing tried Ineffective treatments:  None tried Associated symptoms: swelling   Associated symptoms: no fever and no numbness    HPI Comments: Jennifer Stanton is a 52 y.o. female who presents to the Emergency Department complaining of a dog bite to the left arm that occurred PTA while she states that she was walking through [facility] and the dog bit her without provocation. The bleeding is well-controlled at this time. She reports associated dull pain, ecchymosis, swelling, numbness, and paresthesias to the left arm. Patient reports that the owner of the dog stated that the dog is aggressive and its mouth was covered with a rubber band. She states that the dog is currently enrolled in training classes at [facility], so all of its vaccinations should be up to date. She denies fever. Patient reports that she does not know when her last tetanus vaccination was. Patient reports a history of seizures, but denies any other pertinent medical history.  Past Medical History  Diagnosis Date  . Seizures    History reviewed. No pertinent past surgical history. History  reviewed. No pertinent family history. History  Substance Use Topics  . Smoking status: Former Games developer  . Smokeless tobacco: Not on file  . Alcohol Use: Yes   OB History   Grav Para Term Preterm Abortions TAB SAB Ect Mult Living                 Review of Systems  Constitutional: Negative for fever.  Gastrointestinal: Negative for nausea and vomiting.  Musculoskeletal: Positive for myalgias. Negative for joint swelling.  Skin: Positive for color change ( ecchymosis) and wound.  Allergic/Immunologic: Negative for immunocompromised state.  Neurological: Negative for weakness and numbness.       Paresthesias ulnar distribution of right hand  Hematological: Does not bruise/bleed easily.  Psychiatric/Behavioral: The patient is not nervous/anxious.     Allergies  Codeine  Home Medications   Current Outpatient Rx  Name  Route  Sig  Dispense  Refill  . acetaminophen (TYLENOL) 500 MG tablet   Oral   Take 1,000 mg by mouth every 6 (six) hours as needed for pain. Has taken 6 total today.         Marland Kitchen amoxicillin-clavulanate (AUGMENTIN) 875-125 MG per tablet   Oral   Take 1 tablet by mouth 2 (two) times daily. One po bid x 7 days   14 tablet   0   . HYDROcodone-acetaminophen (NORCO/VICODIN) 5-325 MG per tablet   Oral   Take 1-2 tablets by mouth every 4 (four) hours as needed for pain.   15 tablet  0    Triage Vitals: BP 109/71  Pulse 65  Temp(Src) 98.4 F (36.9 C) (Oral)  Resp 16  Wt 139 lb (63.05 kg)  BMI 22.45 kg/m2  SpO2 95%  Physical Exam  Nursing note and vitals reviewed. Constitutional: She is oriented to person, place, and time. She appears well-developed and well-nourished. No distress.  HENT:  Head: Normocephalic and atraumatic.  Eyes: Conjunctivae are normal. No scleral icterus.  Neck: Normal range of motion.  Cardiovascular: Normal rate, regular rhythm, normal heart sounds and intact distal pulses.   No murmur heard. Capillary refill < 3 sec   Pulmonary/Chest: Effort normal and breath sounds normal. No respiratory distress. She has no wheezes.  Musculoskeletal: Normal range of motion. She exhibits no edema.       Arms: ROM: normal in all joints of the right upper terminate  Neurological: She is alert and oriented to person, place, and time. She exhibits normal muscle tone. Coordination normal.  Sensation: intact to dull and sharp Strength: normal Radial, median and ulnar nerve intact in the right lower extremity  Skin: Skin is warm and dry. She is not diaphoretic. There is erythema.  Abrasion to the right tricep without active bleeding.  Psychiatric: She has a normal mood and affect.    ED Course  Procedures (including critical care time)  DIAGNOSTIC STUDIES: Oxygen Saturation is 95% on room air, normal by my interpretation.    COORDINATION OF CARE: 10:14 PM- Will clean the wound and apply a dressing. Will order a tetanus vaccination and ibuprofen. Will discharge patient with antibiotics. Advised patient to apply ice to the area until symptoms subside. Discussed treatment plan with patient at bedside and patient verbalized agreement.     Labs Review Labs Reviewed - No data to display Imaging Review No results found.  EKG Interpretation   None       MDM   1. Dog bite of left arm   2. Need for diphtheria-tetanus-pertussis (Tdap) vaccine, adult/adolescent      Nikira L Keim presents with dog bite to the right arm.  Patient with abrasions to the right upper arm, ecchymosis and swelling. Full range of motion of the right shoulder, right elbow and right wrist. Patient went to paresthesias in the ulnar distribution of the right arm but sensation and motor function are intact.  No concern for fracture; imaging that indicated this time. Patient without gaping laceration.  Pt wounds irrigated well with 18ga angiocath with sterile saline.  Wounds examined with visualization of the base and no foreign bodies seen.  NO  rabies vaccine as patient believes the do was UTD.  Pt Alert and oriented, NAD, nontoxic, nonseptic appearing.  Capillary refill intact and pt without neurologic deficit. Patient tetanus updated.  We'll discharge home with pain medication, Augmentin and requests for close follow-up with PCP or back in the ER.   It has been determined that no acute conditions requiring further emergency intervention are present at this time. The patient/guardian have been advised of the diagnosis and plan. We have discussed signs and symptoms that warrant return to the ED, such as changes or worsening in symptoms.   Vital signs are stable at discharge.   BP 109/71  Pulse 65  Temp(Src) 98.4 F (36.9 C) (Oral)  Resp 16  Wt 139 lb (63.05 kg)  BMI 22.45 kg/m2  SpO2 95%  Patient/guardian has voiced understanding and agreed to follow-up with the PCP or specialist.   I personally performed the services described in  this documentation, which was scribed in my presence. The recorded information has been reviewed and is accurate.    Dahlia Client Goldie Dimmer, PA-C 01/01/13 [phone]

## 2013-01-02 NOTE — ED Provider Notes (Signed)
Medical screening examination/treatment/procedure(s) were performed by non-physician practitioner and as supervising physician I was immediately available for consultation/collaboration.  EKG Interpretation   None         Enid Skeens, MD 01/02/13 [phone]

## 2013-01-03 NOTE — Progress Notes (Signed)
                  Dear _______[name]____________________:  Bonita Quin have been approved to have your discharge prescriptions filled through our [facility] (Medication Assistance Through [facility]) program. This program allows for a one-time (no refills) 34-day supply of selected medications for a low copay amount.  The copay is $3.00 per prescription. For instance, if you have one prescription, you will pay $3.00; for two prescriptions, you pay $6.00; for three prescriptions, you pay $9.00; and so on.  Only certain pharmacies are participating in this program with [facility]. You will need to select one of the pharmacies from the attached list and take your prescriptions, this letter, and your photo ID to one of the participating pharmacies.   We are excited that you are able to use the [facility] program to get your medications. These prescriptions must be filled within 7 days of hospital discharge or they will no longer be valid for the [facility] program. Should you have any problems with your prescriptions please contact your case management team member at [phone].  Thank you,   [facility]   Participating [facility]acies  [facility]   [facility] [address] [place], [place]   [facility] [address] [place], [place]   [facility] [address] [place], [place]   [facility]   [address] [place], [place]   [address] [place], [place], [place]   [address] [place], [place]   [address] [place], [place]   [address] [place], [place]   [address] [place], [place]   [address] [place], [place]   [address], [place], [place]   [address], [place], [place]   [address] [place], [place], [place]  [facility]   [address], [place], [place]   [address] [place],  [place]   [address] [place], [place], [place]   [address] [place], [place]   [address] [place], [place]   [address] [place] [address] [place], [place], [place] [facility]   [address] [place], [place]   [address] [employer], [place], [place]   [address] [place], [place]   [address], [place], [place]   [address], [place], [place]   [address] [place], [place]   [address] [place], [place]   [address], [place], [place]   [address], [place], [place]   [address], [place], [place]   [address], [place], [place]   [address], [place], [place]   [address], [place], [place]   [address] [place], [place], [place]  Independent Pharmacies   [facility] [address] [place] [place], [place]   [facility] [address] [place], [place]   [facility] [address] [place], [place]   For continued medication needs, please contact the [facility] [address] Department at  [phone].

## 2013-01-03 NOTE — Progress Notes (Signed)
01/03/13 1550 Pt returned call to CM stating [facility] does not have her augmentin rx on file Pt remembered she did not get the Rx back from the [facility] pharmacy  01/03/2013 1558 CM spoke with pharmacy staff at [address] [place], [place], [place] [address] [phone] [facility] but was informed [facility] pharmacy staff needs to contact [facility] staff CM spoke with Feliz Beam at [facility] to confirm [facility] letter is at the pharmacy and he will call [facility] at [phone] to get Rx faxed or oral information of Rx so pt can get her Augmentin from [facility] 01/03/13 1611 Pt updated and encouraged to [facility] prior to returning to check that medication is ready

## 2013-01-03 NOTE — Progress Notes (Signed)
   CARE MANAGEMENT ED NOTE 01/03/2013  Patient:  Jennifer Stanton, Jennifer Stanton   Account Number:  [id number]  Date Initiated:  01/03/2013  Documentation initiated by:  Edd Arbour  Subjective/Objective Assessment:   52 yr old female self pay [place] resident left ED CM a voice message on office line on 01/02/13 Stating she had been seen at [facility] ED on 01/01/13 after a dog bite at [facility] discharged with rx for Augmentin. Rx taken to [facility]     Subjective/Objective Assessment Detail:   Can not afford the $51 cost of Augmentin Confirms she is self pay without a pcp and did not obtain a pcp since 2013 Informed CM she recently started receiving "food stamps"     Action/Plan:   CM returned call to pt at number [phone] Spoke with pt when she returned a call to CM. CM assessed pt for [facility] candidacy and evaluated pt to see if she had obtained a pcp after This CM spoke with her 05/2011 at [facility]   Action/Plan Detail:   Anticipated DC Date:  01/01/2013     Status Recommendation to Physician:   Result of Recommendation:    Other ED Services  Consult Working Plan    DC Planning Services  Other  PCP issues  Medication Assistance  MATCH Program  Outpatient Services - Pt will follow up    Choice offered to / List presented to:            Status of service:  Completed, signed off  ED Comments:   ED Comments Detail:  CM reviewed EPIC notes and chart review information CM spoke with the pt about [facility] MATCH program ($3 co pay for each Rx through [facility] program, does not include refills, 7 day expiration of MATCH letter and choice of participating pharmacies) Pt agreed to receive assistance from program and [facility] [address] [place] (near her home) CM discussed information for self pay pcps, importance of pcp for f/u care Reviewed resources for [employer] self pay pcps like  [facility] urgent care plus other urgent care center and  Adult care center at [address]. Pt voiced understanding and appreciation of resources provided Pt choice is to go to the [facility] urgent care for pcp follow up services Pt is eligible for [facility] MATCH program (unable to find pt listed in PDMI per cardholder name inquiry) Spoke to Rob at the pharmacy to verify fax number as [phone] PDMI information entered. MATCH letter completed and faxed to [address] [employer] with fax confirmation at [facility] 01/03/13.

## 2013-01-10 ENCOUNTER — Other Ambulatory Visit: Payer: Self-pay

## 2013-08-16 ENCOUNTER — Emergency Department (HOSPITAL_COMMUNITY)
Admission: EM | Admit: 2013-08-16 | Discharge: 2013-08-16 | Disposition: A | Discharge status: Home / Self Care | Payer: Self-pay | Attending: Emergency Medicine | Admitting: Emergency Medicine

## 2013-08-16 ENCOUNTER — Emergency Department (HOSPITAL_COMMUNITY): Payer: Self-pay

## 2013-08-16 ENCOUNTER — Encounter (HOSPITAL_COMMUNITY): Payer: Self-pay | Admitting: Emergency Medicine

## 2013-08-16 DIAGNOSIS — R197 Diarrhea, unspecified: Secondary | ICD-10-CM | POA: Insufficient documentation

## 2013-08-16 DIAGNOSIS — Z8669 Personal history of other diseases of the nervous system and sense organs: Secondary | ICD-10-CM | POA: Insufficient documentation

## 2013-08-16 DIAGNOSIS — W1809XA Striking against other object with subsequent fall, initial encounter: Secondary | ICD-10-CM | POA: Insufficient documentation

## 2013-08-16 DIAGNOSIS — S0990XA Unspecified injury of head, initial encounter: Secondary | ICD-10-CM | POA: Insufficient documentation

## 2013-08-16 DIAGNOSIS — Z792 Long term (current) use of antibiotics: Secondary | ICD-10-CM | POA: Insufficient documentation

## 2013-08-16 DIAGNOSIS — Y9389 Activity, other specified: Secondary | ICD-10-CM | POA: Insufficient documentation

## 2013-08-16 DIAGNOSIS — S298XXA Other specified injuries of thorax, initial encounter: Secondary | ICD-10-CM | POA: Insufficient documentation

## 2013-08-16 DIAGNOSIS — Y9229 Other specified public building as the place of occurrence of the external cause: Secondary | ICD-10-CM | POA: Insufficient documentation

## 2013-08-16 DIAGNOSIS — IMO0002 Reserved for concepts with insufficient information to code with codable children: Secondary | ICD-10-CM | POA: Insufficient documentation

## 2013-08-16 MED ORDER — ONDANSETRON 4 MG PO TBDP: ORAL_TABLET | ORAL | Status: DC

## 2013-08-16 MED ORDER — IBUPROFEN 800 MG PO TABS
800.0000 mg | ORAL_TABLET | Freq: Once | ORAL | Status: AC
  Administered 2013-08-16: 800 mg via ORAL
  Filled 2013-08-16: qty 1

## 2013-08-16 NOTE — Discharge Instructions (Signed)

## 2013-08-16 NOTE — ED Notes (Signed)
She was leaning over yesterday and a revolving door hit her in the head. She denies LOC. Shes had dizziness and vomiting since the incident. shes A&Ox4, resp e/u

## 2013-08-16 NOTE — ED Provider Notes (Signed)
CSN: [id number]     Arrival date & time 08/16/13  1411 History   First MD Initiated Contact with Patient 08/16/13 1503     Chief Complaint  Patient presents with  . Head Injury     (Consider location/radiation/quality/duration/timing/severity/associated sxs/prior Treatment) Patient is a 53 y.o. female presenting with head injury. The history is provided by the patient.  Head Injury Location:  Occipital and R parietal Time since incident:  1 day Mechanism of injury: fall   Pain details:    Quality:  Aching   Severity:  Mild   Timing:  Constant   Progression:  Unchanged Chronicity:  New Relieved by:  Nothing Worsened by:  Nothing tried Associated symptoms: nausea and vomiting   Associated symptoms: no blurred vision, no difficulty breathing, no disorientation, no double vision, no hearing loss, no loss of consciousness, no neck pain, no numbness and no seizures     Past Medical History  Diagnosis Date  . Seizures    History reviewed. No pertinent past surgical history. History reviewed. No pertinent family history. History  Substance Use Topics  . Smoking status: Former Games developermoker  . Smokeless tobacco: Not on file  . Alcohol Use: Yes   OB History   Grav Para Term Preterm Abortions TAB SAB Ect Mult Living                 Review of Systems  Constitutional: Negative for fever.  HENT: Negative for hearing loss.   Eyes: Negative for blurred vision and double vision.  Respiratory: Negative for cough and shortness of breath.   Cardiovascular: Positive for chest pain. Negative for leg swelling.  Gastrointestinal: Positive for nausea, vomiting and diarrhea. Negative for abdominal pain.  Musculoskeletal: Negative for neck pain.  Neurological: Positive for dizziness. Negative for tremors, seizures, loss of consciousness, syncope, weakness and numbness.  All other systems reviewed and are negative.     Allergies  Codeine  Home Medications   Prior to Admission medications    Medication Sig Start Date End Date Taking? Authorizing Provider  acetaminophen (TYLENOL) 500 MG tablet Take 1,000 mg by mouth every 6 (six) hours as needed for pain. Has taken 6 total today.   Yes Historical Provider, MD  ibuprofen (ADVIL,MOTRIN) 200 MG tablet Take 200 mg by mouth every 6 (six) hours as needed.   Yes Historical Provider, MD  amoxicillin-clavulanate (AUGMENTIN) 875-125 MG per tablet Take 1 tablet by mouth 2 (two) times daily. One po bid x 7 days. Patient completed course of medication couple months back from today (08-16-13) 01/01/13   Hannah Muthersbaugh, PA-C   BP 140/74  Pulse 76  Temp(Src) 98.1 F (36.7 C) (Oral)  Resp 18  SpO2 98% Physical Exam  Nursing note and vitals reviewed. Constitutional: She is oriented to person, place, and time. She appears well-developed and well-nourished. No distress.  HENT:  Head: Normocephalic and atraumatic.  Eyes: EOM are normal. Pupils are equal, round, and reactive to light.  Neck: Normal range of motion. Neck supple.  Cardiovascular: Normal rate and regular rhythm.  Exam reveals no friction rub.   No murmur heard. Pulmonary/Chest: Effort normal and breath sounds normal. No respiratory distress. She has no wheezes. She has no rales.  Abdominal: Soft. She exhibits no distension. There is no tenderness. There is no rebound.  Musculoskeletal: Normal range of motion. She exhibits no edema.       Cervical back: She exhibits normal range of motion, no tenderness and no bony tenderness.  Back:  Neurological: She is alert and oriented to person, place, and time.  Skin: She is not diaphoretic.    ED Course  Procedures (including critical care time) Labs Review Labs Reviewed - No data to display  Imaging Review Ct Head Wo Contrast  08/16/2013   CLINICAL DATA:  Trauma to the head yesterday with dizziness and vomiting.  EXAM: CT HEAD WITHOUT CONTRAST  TECHNIQUE: Contiguous axial images were obtained from the base of the skull  through the vertex without intravenous contrast.  COMPARISON:  Head CT 07/01/2008  FINDINGS: The brain has a normal appearance without evidence of atrophy, infarction, mass lesion, hemorrhage, hydrocephalus or extra-axial collection. The calvarium is unremarkable. The paranasal sinuses, middle ears and mastoids are clear.  IMPRESSION: Normal examination.   Electronically Signed   By: Paulina FusiMark  Shogry M.D.   On: 08/16/2013 16:29     EKG Interpretation None      MDM   Final diagnoses:  Head injury    32F with hx of seizures presents with head injury. While bending over at a home improvement store, a sliding door hit her in the back of her head. She fell forward and hit the top of her head on the wall after that. No LOC at that time. Afterwards, later that evening, had some nausea, vomiting, and dizziness. Having some mild orthostatic dizziness. Similar this morning. AFVSS here. No neck pain, no extremity pain or deformity. Normal cranial nerves, normal strength and sensation diffusely. Mild R thoracic muscular back pain. No abdominal pain.  Will scan her head. Likely concussion symptoms. R back pain muscular, no need for imaging. CT Head normal. Stable for discharge.    Dagmar HaitWilliam Airabella Barley, MD 08/17/13 [id number]

## 2013-08-16 NOTE — ED Notes (Signed)
Checked patient without her glasses 20/70 both eyes, 20/70 right eye 20/50 left eye

## 2013-08-16 NOTE — ED Notes (Signed)
Pt transported to CT ?

## 2013-08-16 NOTE — ED Notes (Signed)
MD at bedside. 

## 2013-08-16 NOTE — ED Notes (Addendum)
Reports nausea, dry heaves, headaches since accident yesterday. Pt states that she has blurred vision as well. Pt reports tenderness to top of head and rt lateral aspect. Pt states that her rt rib cage feels like she pulls a muscle.

## 2014-06-11 ENCOUNTER — Emergency Department (HOSPITAL_COMMUNITY)
Admission: EM | Admit: 2014-06-11 | Discharge: 2014-06-11 | Disposition: A | Discharge status: Home / Self Care | Payer: Self-pay | Attending: Emergency Medicine | Admitting: Emergency Medicine

## 2014-06-11 ENCOUNTER — Encounter (HOSPITAL_COMMUNITY): Payer: Self-pay | Admitting: Neurology

## 2014-06-11 DIAGNOSIS — M791 Myalgia: Secondary | ICD-10-CM | POA: Insufficient documentation

## 2014-06-11 DIAGNOSIS — H9211 Otorrhea, right ear: Secondary | ICD-10-CM

## 2014-06-11 DIAGNOSIS — R55 Syncope and collapse: Secondary | ICD-10-CM

## 2014-06-11 DIAGNOSIS — G44229 Chronic tension-type headache, not intractable: Secondary | ICD-10-CM

## 2014-06-11 DIAGNOSIS — R599 Enlarged lymph nodes, unspecified: Secondary | ICD-10-CM | POA: Insufficient documentation

## 2014-06-11 LAB — COMPREHENSIVE METABOLIC PANEL
ALT: 14 U/L (ref 0–35)
AST: 20 U/L (ref 0–37)
Albumin: 4.2 g/dL (ref 3.5–5.2)
Alkaline Phosphatase: 74 U/L (ref 39–117)
Anion gap: 8 (ref 5–15)
BUN: 12 mg/dL (ref 6–23)
CO2: 25 mmol/L (ref 19–32)
CREATININE: 0.71 mg/dL (ref 0.50–1.10)
Calcium: 9.4 mg/dL (ref 8.4–10.5)
Chloride: 105 mmol/L (ref 96–112)
GLUCOSE: 104 mg/dL — AB (ref 70–99)
Potassium: 4.3 mmol/L (ref 3.5–5.1)
Sodium: 138 mmol/L (ref 135–145)
Total Bilirubin: 0.6 mg/dL (ref 0.3–1.2)
Total Protein: 8 g/dL (ref 6.0–8.3)

## 2014-06-11 LAB — CBC WITH DIFFERENTIAL/PLATELET
Basophils Absolute: 0 10*3/uL (ref 0.0–0.1)
Basophils Relative: 0 % (ref 0–1)
Eosinophils Absolute: 0.2 10*3/uL (ref 0.0–0.7)
Eosinophils Relative: 3 % (ref 0–5)
HEMATOCRIT: 45.4 % (ref 36.0–46.0)
Hemoglobin: 15.5 g/dL — ABNORMAL HIGH (ref 12.0–15.0)
LYMPHS ABS: 2.4 10*3/uL (ref 0.7–4.0)
LYMPHS PCT: 39 % (ref 12–46)
MCH: 30.3 pg (ref 26.0–34.0)
MCHC: 34.1 g/dL (ref 30.0–36.0)
MCV: 88.8 fL (ref 78.0–100.0)
MONO ABS: 0.5 10*3/uL (ref 0.1–1.0)
MONOS PCT: 8 % (ref 3–12)
NEUTROS ABS: 3 10*3/uL (ref 1.7–7.7)
NEUTROS PCT: 50 % (ref 43–77)
Platelets: 238 10*3/uL (ref 150–400)
RBC: 5.11 MIL/uL (ref 3.87–5.11)
RDW: 12.6 % (ref 11.5–15.5)
WBC: 6.1 10*3/uL (ref 4.0–10.5)

## 2014-06-11 LAB — URINALYSIS, ROUTINE W REFLEX MICROSCOPIC
BILIRUBIN URINE: NEGATIVE
Glucose, UA: NEGATIVE mg/dL
Hgb urine dipstick: NEGATIVE
KETONES UR: NEGATIVE mg/dL
Leukocytes, UA: NEGATIVE
NITRITE: NEGATIVE
PH: 5.5 (ref 5.0–8.0)
Protein, ur: NEGATIVE mg/dL
SPECIFIC GRAVITY, URINE: 1.014 (ref 1.005–1.030)
Urobilinogen, UA: 0.2 mg/dL (ref 0.0–1.0)

## 2014-06-11 LAB — LIPASE, BLOOD: Lipase: 27 U/L (ref 11–59)

## 2014-06-11 MED ORDER — NEOMYCIN-POLYMYXIN-HC 3.5-10000-1 OT SUSP
4.0000 [drp] | Freq: Four times a day (QID) | OTIC | Status: DC
Start: 2014-06-11 — End: 2014-06-20

## 2014-06-11 MED ORDER — METOCLOPRAMIDE HCL 5 MG/ML IJ SOLN
10.0000 mg | Freq: Once | INTRAMUSCULAR | Status: AC
  Administered 2014-06-11: 10 mg via INTRAVENOUS
  Filled 2014-06-11: qty 2

## 2014-06-11 MED ORDER — DIPHENHYDRAMINE HCL 50 MG/ML IJ SOLN
25.0000 mg | Freq: Once | INTRAMUSCULAR | Status: AC
  Administered 2014-06-11: 25 mg via INTRAVENOUS
  Filled 2014-06-11: qty 1

## 2014-06-11 MED ORDER — KETOROLAC TROMETHAMINE 30 MG/ML IJ SOLN
30.0000 mg | Freq: Once | INTRAMUSCULAR | Status: AC
  Administered 2014-06-11: 30 mg via INTRAVENOUS
  Filled 2014-06-11: qty 1

## 2014-06-11 MED ORDER — SODIUM CHLORIDE 0.9 % IV BOLUS (SEPSIS)
1000.0000 mL | Freq: Once | INTRAVENOUS | Status: AC
  Administered 2014-06-11: 1000 mL via INTRAVENOUS

## 2014-06-11 NOTE — ED Provider Notes (Signed)
CSN: [id number]     Arrival date & time 06/11/14  0957 History   First MD Initiated Contact with Patient 06/11/14 1014     Chief Complaint  Patient presents with  . Near Syncope     (Consider location/radiation/quality/duration/timing/severity/associated sxs/prior Treatment) HPI Comments: Jennifer Stanton is a 54 y.o. female with a PMHx of remote seizures no longer on tegretol or dilantin, who presents to the ED with multiple complaints listed on a sheet of paper which have been ongoing for 2 months or longer. She reports her main concerns are swollen lymph nodes in her neck, clay-colored stools, and intermittent "blackout spells" which a been ongoing on average once per week 2 months. She states the syncopal episodes occur typically at rest, the last one occurring yesterday, when she was sitting on the couch watching TV with a friend and she was told that she "slumped over" onto her friend, and then her friend stated that she was "shaking" everywhere with no focal shaking. She recalls awakening shortly after to her friend wiping her with a towel, and does not report any post ictal state. She states it's been years since she was on Tegretol and Dilantin for seizures, and she has not had any issues since then. She also reports pain all over that has been ongoing for "her whole life" and daily headaches which are bandlike tightness 7/10 in severity, worse with reading or being outside, and improved with ibuprofen. She has one today. She states that she has some blurred vision which she attributes to needing new glasses since it has been 10 years since she last changed her prescription, and admits that this may be causing her headaches. She denies any loss of vision, lightheadedness, dizziness, vertigo, numbness, tingling, weakness, weight loss, loss of appetite, fevers, chills, chest pain, shortness breath, cough, rhinorrhea, sore throat, abdominal pain, nausea, vomiting, diarrhea, constipation, melena,  hematochezia, rectal pain, dysuria, hematuria, vaginal bleeding or discharge, alcohol use, IV drug use, or recent travel. Additionally she states that she has had yellowish tan colored discharge from her right ear which has been ongoing for "several months" and she has been seen previously for this. She states that she had surgery on this year and has had issues ever since. She admits to taking 1000 mg of ibuprofen daily for her pain. Does not have a PCP.  After exam was finished, she became very tearful and admits that she "cries a lot" and although she's never been diagnosed as being depressed, she thinks she is. She denies SI/HI.  Patient is a 54 y.o. female presenting with near-syncope. The history is provided by the patient. No language interpreter was used.  Near Syncope This is a recurrent problem. The current episode started more than 1 month ago. The problem occurs intermittently. The problem has been unchanged. Associated symptoms include headaches (daily), myalgias ("pain all over for my whole life") and a visual change (intermittent blurred vision). Pertinent negatives include no abdominal pain, arthralgias, chest pain, chills, coughing, fatigue, fever, nausea, neck pain, numbness, rash, sore throat, vertigo, vomiting or weakness. Nothing aggravates the symptoms. She has tried nothing for the symptoms. The treatment provided no relief.    Past Medical History  Diagnosis Date  . Seizures    History reviewed. No pertinent past surgical history. No family history on file. History  Substance Use Topics  . Smoking status: Current Every Day Smoker  . Smokeless tobacco: Not on file  . Alcohol Use: No   OB History  No data available     Review of Systems  Constitutional: Negative for fever, chills, fatigue and unexpected weight change.  HENT: Positive for ear discharge. Negative for ear pain, hearing loss, rhinorrhea, sore throat and trouble swallowing.   Eyes: Positive for visual  disturbance (intermittent "blurred" vision, attributes this to glasses). Negative for pain.  Respiratory: Negative for cough and shortness of breath.   Cardiovascular: Positive for near-syncope. Negative for chest pain.  Gastrointestinal: Negative for nausea, vomiting, abdominal pain, diarrhea, constipation, blood in stool, anal bleeding and rectal pain.  Genitourinary: Negative for dysuria, hematuria, vaginal bleeding and vaginal discharge.  Musculoskeletal: Positive for myalgias ("pain all over for my whole life"). Negative for arthralgias, neck pain and neck stiffness.  Skin: Negative for color change and rash.  Allergic/Immunologic: Negative for immunocompromised state.  Neurological: Positive for syncope ("blacks out" intermittently) and headaches (daily). Negative for dizziness, vertigo, weakness, light-headedness and numbness.  Hematological: Positive for adenopathy (neck). Does not bruise/bleed easily.  Psychiatric/Behavioral: Negative for confusion.   10 Systems reviewed and are negative for acute change except as noted in the HPI.    Allergies  Codeine  Home Medications   Prior to Admission medications   Medication Sig Start Date End Date Taking? Authorizing Provider  acetaminophen (TYLENOL) 500 MG tablet Take 1,000 mg by mouth every 6 (six) hours as needed for pain. Has taken 6 total today.    Historical Provider, MD  amoxicillin-clavulanate (AUGMENTIN) 875-125 MG per tablet Take 1 tablet by mouth 2 (two) times daily. One po bid x 7 days. Patient completed course of medication couple months back from today (08-16-13) 01/01/13   [facility], PA-C  ibuprofen (ADVIL,MOTRIN) 200 MG tablet Take 200 mg by mouth every 6 (six) hours as needed.    Historical Provider, MD  ondansetron (ZOFRAN ODT) 4 MG disintegrating tablet 4mg  ODT q4 hours prn nausea/vomit 08/16/13   Elwin Mocha, MD   BP 132/89 mmHg  Pulse 72  Temp(Src) 98.3 F (36.8 C) (Oral)  Resp 16  Ht 5\' 6"  (1.676 m)   Wt 138 lb (62.596 kg)  BMI 22.28 kg/m2  SpO2 99% Physical Exam  Constitutional: She is oriented to person, place, and time. Vital signs are normal. She appears well-developed and well-nourished.  Non-toxic appearance. No distress.  Afebrile, nontoxic, NAD Towards the end of exam pt becomes tearful  HENT:  Head: Normocephalic and atraumatic.  Right Ear: Hearing, tympanic membrane and ear canal normal. There is drainage (cerumen-like tan colored drainage) and tenderness (with pinna retraction). No swelling. Tympanic membrane is not injected, not erythematous and not bulging.  Left Ear: Hearing, tympanic membrane, external ear and ear canal normal.  Nose: Nose normal.  Mouth/Throat: Uvula is midline, oropharynx is clear and moist and mucous membranes are normal. No trismus in the jaw. No uvula swelling.  R ear with pain with pinna retraction, cerumen-like tan colored drainage, no erythema or canal swelling, TM without erythema or bulging, no loss of landmarks. L ear without pain or drainage  Eyes: Conjunctivae and EOM are normal. Pupils are equal, round, and reactive to light. Right eye exhibits no discharge. Left eye exhibits no discharge.  PERRL, EOMI, no nystagmus, no visual field deficits  Visual acuity: Bilateral Near: 20/40 ; R Near: 20/70 ; L Near: 20/50  Neck: Normal range of motion. Neck supple. No spinous process tenderness and no muscular tenderness present. No rigidity. Normal range of motion present.  FROM intact without spinous process or paraspinous muscle TTP, no bony  stepoffs or deformities, no muscle spasms. No rigidity or meningeal signs.  Cardiovascular: Normal rate, regular rhythm, normal heart sounds and intact distal pulses.  Exam reveals no gallop and no friction rub.   No murmur heard. RRR, nl s1/s2, no m/r/g, distal pulses intact, no pedal edema   Pulmonary/Chest: Effort normal and breath sounds normal. No respiratory distress. She has no decreased breath sounds. She has  no wheezes. She has no rhonchi. She has no rales.  Abdominal: Soft. Normal appearance and bowel sounds are normal. She exhibits no distension. There is no tenderness. There is no rigidity, no rebound, no guarding, no CVA tenderness, no tenderness at McBurney's point and negative Murphy's sign.  Soft, NTND, +BS throughout, no r/g/r, neg murphy's, neg mcburney's, no CVA TTP   Musculoskeletal: Normal range of motion.  MAE x4 Strength and sensation grossly intact Distal pulses intact  Lymphadenopathy:    She has cervical adenopathy.       Right cervical: Superficial cervical adenopathy present.       Left cervical: Superficial cervical adenopathy present.  Shotty superficial cervical LAD bilaterally, mildly TTP on R side, all lymph nodes mobile, soft, and <1cm  Neurological: She is alert and oriented to person, place, and time. She has normal strength. No cranial nerve deficit or sensory deficit. She displays a negative Romberg sign. Coordination and gait normal. GCS eye subscore is 4. GCS verbal subscore is 5. GCS motor subscore is 6.  CN 2-12 grossly intact A&O x4 GCS 15 Sensation and strength intact Gait nonataxic including with tandem walking Coordination with finger-to-nose WNL Neg romberg, neg pronator drift   Skin: Skin is warm, dry and intact. No rash noted.  Psychiatric: She has a normal mood and affect.  Nursing note and vitals reviewed.   ED Course  Procedures (including critical care time) 11:02:05 Orthostatic Vital Signs SS  Orthostatic Lying  - BP- Lying: 123/66 mmHg ; Pulse- Lying: 73  Orthostatic Sitting - BP- Sitting: 112/78 mmHg ; Pulse- Sitting: 75  Orthostatic Standing at 0 minutes - BP- Standing at 0 minutes: 120/70 mmHg ; Pulse- Standing at 0 minutes: 84      Labs Review Labs Reviewed  CBC WITH DIFFERENTIAL/PLATELET - Abnormal; Notable for the following:    Hemoglobin 15.5 (*)    All other components within normal limits  COMPREHENSIVE METABOLIC PANEL -  Abnormal; Notable for the following:    Glucose, Bld 104 (*)    All other components within normal limits  LIPASE, BLOOD  URINALYSIS, ROUTINE W REFLEX MICROSCOPIC    Imaging Review No results found.   EKG Interpretation   Date/Time:  Wednesday June 11 2014 10:14:20 EDT Ventricular Rate:  66 PR Interval:  140 QRS Duration: 96 QT Interval:  376 QTC Calculation: 394 R Axis:   29 Text Interpretation:  Sinus rhythm No significant change since last  tracing Confirmed by Ethelda ChickJACUBOWITZ  MD, SAM [id number]) on 06/11/2014 10:51:16 AM      MDM   Final diagnoses:  Near syncope  Chronic tension-type headache, not intractable  Ear drainage right    54 y.o. female here with multiple complaints, most of which have been going on for at least 2 months but some have gone on for her "entire life". Main concerns are clay colored stools, swollen lymph nodes in neck, and "syncopal" episodes x2 months. She also has "blurred vision" intermittently, especially when trying to read, and admits her glasses haven't been changed in 6467yrs and she needs new ones. Has daily  headaches, has one today which is same as typical headaches. Nonfocal neuro exam, visual acuity WNL (Bilateral Near: 20/40 ; R Near: 20/70 ; L Near: 20/50). Will give headache cocktail, obtain basic labs, EKG, and U/A but her symptoms seem to be somewhat psychosomatic, at the end of exam she becomes tearful and admits to crying often, denies SI/HI. Doubt need for head CT. Will also get orthostatics. Will reassess shortly.   12:57 PM Pt feeling improved. Labs all unremarkable. EKG unremarkable. Orthostatic VS WNL. Given her c/o R ear drainage, although it does not appear infected, she has pain with retraction of pinna and some cerumen-appearing drainage, therefore will cover with antibiotic but advised that pt should see ENT for ongoing management of her recurrent R ear pain/drainage. Will have her f/up with [place] [facility] for ongoing care. I  explained the diagnosis and have given explicit precautions to return to the ER including for any other new or worsening symptoms. The patient understands and accepts the medical plan as it's been dictated and I have answered their questions. Discharge instructions concerning home care and prescriptions have been given. The patient is STABLE and is discharged to home in good condition.  BP 116/68 mmHg  Pulse 53  Temp(Src) 98.3 F (36.8 C) (Oral)  Resp 18  Ht  (1.676 m)  Wt 138 lb (62.596 kg)  BMI 22.28 kg/m2  SpO2 97%  Meds ordered this encounter  Medications  . metoCLOPramide (REGLAN) injection 10 mg    Sig:    And  . diphenhydrAMINE (BENADRYL) injection 25 mg    Sig:    And  . sodium chloride 0.9 % bolus 1,000 mL    Sig:    And  . ketorolac (TORADOL) 30 MG/ML injection 30 mg    Sig:   . neomycin-polymyxin-hydrocortisone (CORTISPORIN) 3.5-10000-1 otic suspension    Sig: Place 4 drops into the right ear 4 (four) times daily. X 7 days    Dispense:  10 mL    Refill:  0    Order Specific Question:  Supervising Provider    Answer:  Eber Hong [[id number]]      Raetta Agostinelli Camprubi-Soms, PA-C 06/11/14 1258  Doug Sou, MD 06/11/14 1732

## 2014-06-11 NOTE — Discharge Instructions (Signed)
Use cortisporin as directed for your ear pain/drainage. Use tylenol or motrin for your pain. Stay well hydrated. Get your glasses updated. See [place] [facility] in 1week for ongoing medical care. Return to the ER for changes or worsening symptoms.   Draining Ear Fluid (drainage) can come from your ear. This may be wax, yellowish-white fluid (pus), blood, or other fluids. An infection, injury, or irritation may cause fluid to drain from your ear.  HOME CARE  Only take medicine as told by your doctor. This may include ear drops.  Do not rub inside your ear with cotton-tipped swabs.  Do not swim until your doctor says it is okay.  Before you take a shower, cover a cotton ball with petroleum jelly. Put it in your ear. This will keep water out.  Stay away from smoke.  Make sure your shots (vaccinations) are up to date.  Wash your hands well.  Keep all doctor visits as told. GET HELP RIGHT AWAY IF:   You have very bad ear pain or a headache.  You have a fever.  The patient is older than 3 months with a rectal temperature of 102F (38.9C) or higher.  The patient is 72 months old or younger with a rectal temperature of 100.77F (38C) or higher.  You throw up (vomit).  You feel dizzy.  You have twitching or shaking (seizure).  You have new hearing loss.  You have more fluid coming from the ear.  You have pain, a fever, or fluid drainage that does not get better within 48 hours of taking medicine.  You are more tired than normal. MAKE SURE YOU:   Understand these instructions.  Will watch your condition.  Will get help right away if you are not doing well or get worse. Document Released: 08/11/2009 Document Revised: 07/08/2013 Document Reviewed: 08/11/2009 [facility] Patient Information 2015 [place], [place]. This information is not intended to replace advice given to you by your health care provider. Make sure you discuss any questions you have with your health care  provider.  Near-Syncope Near-syncope (commonly known as near fainting) is sudden weakness, dizziness, or feeling like you might pass out. During an episode of near-syncope, you may also develop pale skin, have tunnel vision, or feel sick to your stomach (nauseous). Near-syncope may occur when getting up after sitting or while standing for a long time. It is caused by a sudden decrease in blood flow to the brain. This decrease can result from various causes or triggers, most of which are not serious. However, because near-syncope can sometimes be a sign of something serious, a medical evaluation is required. The specific cause is often not determined. HOME CARE INSTRUCTIONS  Monitor your condition for any changes. The following actions may help to alleviate any discomfort you are experiencing:  Have someone stay with you until you feel stable.  Lie down right away and prop your feet up if you start feeling like you might faint. Breathe deeply and steadily. Wait until all the symptoms have passed. Most of these episodes last only a few minutes. You may feel tired for several hours.   Drink enough fluids to keep your urine clear or pale yellow.   If you are taking blood pressure or heart medicine, get up slowly when seated or lying down. Take several minutes to sit and then stand. This can reduce dizziness.  Follow up with your health care provider as directed. SEEK IMMEDIATE MEDICAL CARE IF:   You have a severe  headache.   You have unusual pain in the chest, abdomen, or back.   You are bleeding from the mouth or rectum, or you have black or tarry stool.   You have an irregular or very fast heartbeat.   You have repeated fainting or have seizure-like jerking during an episode.   You faint when sitting or lying down.   You have confusion.   You have difficulty walking.   You have severe weakness.   You have vision problems.  MAKE SURE YOU:   Understand these  instructions.  Will watch your condition.  Will get help right away if you are not doing well or get worse. Document Released: 02/21/2005 Document Revised: 02/26/2013 Document Reviewed: 07/27/2012 [facility] Patient Information 2015 [place], [place]. This information is not intended to replace advice given to you by your health care provider. Make sure you discuss any questions you have with your health care provider.  General Headache Without Cause A general headache is pain or discomfort felt around the head or neck area. The cause may not be found.  HOME CARE   Keep all doctor visits.  Only take medicines as told by your doctor.  Lie down in a dark, quiet room when you have a headache.  Keep a journal to find out if certain things bring on headaches. For example, write down:  What you eat and drink.  How much sleep you get.  Any change to your diet or medicines.  Relax by getting a massage or doing other relaxing activities.  Put ice or heat packs on the head and neck area as told by your doctor.  Lessen stress.  Sit up straight. Do not tighten (tense) your muscles.  Quit smoking if you smoke.  Lessen how much alcohol you drink.  Lessen how much caffeine you drink, or stop drinking caffeine.  Eat and sleep on a regular schedule.  Get 7 to 9 hours of sleep, or as told by your doctor.  Keep lights dim if bright lights bother you or make your headaches worse. GET HELP RIGHT AWAY IF:   Your headache becomes really bad.  You have a fever.  You have a stiff neck.  You have trouble seeing.  Your muscles are weak, or you lose muscle control.  You lose your balance or have trouble walking.  You feel like you will pass out (faint), or you pass out.  You have really bad symptoms that are different than your first symptoms.  You have problems with the medicines given to you by your doctor.  Your medicines do not work.  Your headache feels different than the  other headaches.  You feel sick to your stomach (nauseous) or throw up (vomit). MAKE SURE YOU:   Understand these instructions.  Will watch your condition.  Will get help right away if you are not doing well or get worse. Document Released: 12/01/2007 Document Revised: 05/16/2011 Document Reviewed: 02/11/2011 [facility] Patient Information 2015 [place], [place]. This information is not intended to replace advice given to you by your health care provider. Make sure you discuss any questions you have with your health care provider.

## 2014-06-11 NOTE — ED Notes (Addendum)
Pt here today with actual list of complaints..swollen lymph nodes in neck and arm pits, clay color bowel, black out spells (average once per week for past 2 months), blurred vision, headache, right knee pain, pain all over body, drainage from right ear. Pt is a x 4

## 2014-06-11 NOTE — ED Notes (Signed)
Pt given turkey sandwich and water

## 2014-06-20 ENCOUNTER — Ambulatory Visit: Payer: Self-pay | Attending: Internal Medicine | Admitting: Internal Medicine

## 2014-06-20 ENCOUNTER — Encounter: Payer: Self-pay | Admitting: Internal Medicine

## 2014-06-20 VITALS — BP 102/67 | HR 77 | Temp 98.7°F | Resp 16 | Ht 66.0 in | Wt 144.0 lb

## 2014-06-20 DIAGNOSIS — Z8669 Personal history of other diseases of the nervous system and sense organs: Secondary | ICD-10-CM

## 2014-06-20 DIAGNOSIS — H60391 Other infective otitis externa, right ear: Secondary | ICD-10-CM

## 2014-06-20 DIAGNOSIS — Z139 Encounter for screening, unspecified: Secondary | ICD-10-CM

## 2014-06-20 DIAGNOSIS — Z87898 Personal history of other specified conditions: Secondary | ICD-10-CM

## 2014-06-20 DIAGNOSIS — R55 Syncope and collapse: Secondary | ICD-10-CM

## 2014-06-20 DIAGNOSIS — H538 Other visual disturbances: Secondary | ICD-10-CM

## 2014-06-20 DIAGNOSIS — Z1231 Encounter for screening mammogram for malignant neoplasm of breast: Secondary | ICD-10-CM

## 2014-06-20 DIAGNOSIS — R519 Headache, unspecified: Secondary | ICD-10-CM

## 2014-06-20 DIAGNOSIS — R51 Headache: Secondary | ICD-10-CM

## 2014-06-20 DIAGNOSIS — Z1211 Encounter for screening for malignant neoplasm of colon: Secondary | ICD-10-CM

## 2014-06-20 HISTORY — DX: Headache, unspecified: R51.9

## 2014-06-20 HISTORY — DX: Personal history of other specified conditions: Z87.898

## 2014-06-20 HISTORY — DX: Other visual disturbances: H53.8

## 2014-06-20 LAB — HEMOGLOBIN A1C
HEMOGLOBIN A1C: 5.8 % — AB (ref <5.7)
MEAN PLASMA GLUCOSE: 120 mg/dL — AB (ref <117)

## 2014-06-20 MED ORDER — AMITRIPTYLINE HCL 10 MG PO TABS: 10.0000 mg | ORAL_TABLET | Freq: Every day | ORAL | Status: DC

## 2014-06-20 MED ORDER — NEOMYCIN-POLYMYXIN-HC 3.5-10000-1 OT SUSP: 4.0000 [drp] | Freq: Four times a day (QID) | OTIC | Status: DC

## 2014-06-20 NOTE — Progress Notes (Signed)
Patient Demographics  Jennifer Stanton, is a 54 y.o. female  ZOX:[id number]  WJX:[id number]  DOB - [birth year]  CC:  Chief Complaint  Patient presents with  . Hospitalization Follow-up  . Establish Care       HPI: Jennifer Stanton is a 54 y.o. female here today to establish medical care.patient has history of seizures and in the past she used to be on Dilantin, her last few months she has been having syncopal episode?seizure like activity, she denies any tongue bite or incontinence, patient  Recently went to the emergency room and was diagnosed with presyncope, patient also had some ear discharge and was prescribed eardrops which she has not taken yet and needs a new prescription, patient also reported to have chronic headaches and these to follow with the neurologist who prescribed her Elavil as per patient at bedtime 25 mg was too much and she could not tolerate. Patient has No headache, No chest pain, No abdominal pain - No Nausea, No new weakness tingling or numbness, No Cough - SOB.  Allergies  Allergen Reactions  . Codeine Rash   Past Medical History  Diagnosis Date  . Seizures    Current Outpatient Prescriptions on File Prior to Visit  Medication Sig Dispense Refill  . ibuprofen (ADVIL,MOTRIN) 200 MG tablet Take 200 mg by mouth every 6 (six) hours as needed.    . loratadine (CLARITIN) 10 MG tablet Take 10 mg by mouth daily.    . ondansetron (ZOFRAN ODT) 4 MG disintegrating tablet  ODT q4 hours prn nausea/vomit (Patient not taking: Reported on 06/20/2014) 10 tablet 0   No current facility-administered medications on file prior to visit.   Family History  Problem Relation Age of Onset  . Cancer Mother   . COPD Mother   . Heart disease Father   . Hemochromatosis Brother    History   Social History  . Marital Status: Divorced    Spouse Name: N/A  . Number of Children: N/A  . Years of Education: N/A   Occupational History  . Not on file.   Social History Main  Topics  . Smoking status: Current Every Day Smoker  . Smokeless tobacco: Not on file  . Alcohol Use: No  . Drug Use: Not on file  . Sexual Activity: Not on file   Other Topics Concern  . Not on file   Social History Narrative    Review of Systems: Constitutional: Negative for fever, chills, diaphoresis, activity change, appetite change and fatigue. HENT: Negative for ear pain, nosebleeds, congestion, facial swelling, rhinorrhea, neck pain, neck stiffness and ear discharge.  Eyes: Negative for pain, discharge, redness, itching and visual disturbance. Respiratory: Negative for cough, choking, chest tightness, shortness of breath, wheezing and stridor.  Cardiovascular: Negative for chest pain, palpitations and leg swelling. Gastrointestinal: Negative for abdominal distention. Genitourinary: Negative for dysuria, urgency, frequency, hematuria, flank pain, decreased urine volume, difficulty urinating and dyspareunia.  Musculoskeletal: Negative for back pain, joint swelling, arthralgia and gait problem. Neurological: Negative for dizziness, tremors, seizures, syncope, facial asymmetry, speech difficulty, weakness, light-headedness, numbness and headaches.  Hematological: Negative for adenopathy. Does not bruise/bleed easily. Psychiatric/Behavioral: Negative for hallucinations, behavioral problems, confusion, dysphoric mood, decreased concentration and agitation.    Objective:   Filed Vitals:   06/20/14 1529  BP: 102/67  Pulse: 77  Temp: 98.7 F (37.1 C)  Resp: 16    Physical Exam: Constitutional: Patient appears well-developed and well-nourished. No distress. HENT: Normocephalic, atraumatic, External right and left  ear normal. Oropharynx is clear and moist.  Eyes: Conjunctivae and EOM are normal. PERRLA, no scleral icterus. Neck: Normal ROM. Neck supple. No JVD. No tracheal deviation. No thyromegaly. CVS: RRR, S1/S2 +, no murmurs, no gallops, no carotid bruit.  Pulmonary: Effort  and breath sounds normal, no stridor, rhonchi, wheezes, rales.  Abdominal: Soft. BS +, no distension, tenderness, rebound or guarding.  Musculoskeletal: Normal range of motion. No edema and no tenderness.  Neuro: Alert. Normal reflexes, muscle tone coordination. No cranial nerve deficit. Skin: Skin is warm and dry. No rash noted. Not diaphoretic. No erythema. No pallor. Psychiatric: Normal mood and affect. Behavior, judgment, thought content normal.  Lab Results  Component Value Date   WBC 6.1 06/11/2014   HGB 15.5* 06/11/2014   HCT 45.4 06/11/2014   MCV 88.8 06/11/2014   PLT 238 06/11/2014   Lab Results  Component Value Date   CREATININE 0.71 06/11/2014   BUN 12 06/11/2014   NA 138 06/11/2014   K 4.3 06/11/2014   CL 105 06/11/2014   CO2 25 06/11/2014    No results found for: HGBA1C Lipid Panel  No results found for: CHOL, TRIG, HDL, CHOLHDL, VLDL, LDLCALC     Assessment and plan:   1. History of seizure  - Ambulatory referral to Neurology  2. Blurry vision  - Ambulatory referral to Ophthalmology  4. Pre-syncope ?seizure-like activity, patient is referred to neurology  5. Chronic nonintractable headache, unspecified headache type  - amitriptyline (ELAVIL) 10 MG tablet; Take 1 tablet (10 mg total) by mouth at bedtime.  Dispense: 30 tablet; Refill: 3 - Ambulatory referral to Neurology  6. Special screening for malignant neoplasms, colon  - Ambulatory referral to Gastroenterology  7. Encounter for screening mammogram for breast cancer  - MM DIGITAL SCREENING BILATERAL; Future  8. Otitis, externa, infective, right  - neomycin-polymyxin-hydrocortisone (CORTISPORIN) 3.5-10000-1 otic suspension; Place 4 drops into the right ear 4 (four) times daily. X 7 days  Dispense: 10 mL; Refill: 0  9. Screening Ordered baseline blood work  - TSH - Vit D  25 hydroxy (rtn osteoporosis monitoring) - Hemoglobin A1c - COMPLETE METABOLIC PANEL WITH GFR        Health  Maintenance -Colonoscopy:Referred to GI -Pap Smear:will be scheduled -Mammogram:ordered   Return in about 3 months (around 09/19/2014), or if symptoms worsen or fail to improve, for Schedule Appt with Dr Glendora ScoreFunches/ Valerie for PAP.    The patient was given clear instructions to go to ER or return to medical center if symptoms don't improve, worsen or new problems develop. The patient verbalized understanding. The patient was told to call to get lab results if they haven't heard anything in the next week.    This note has been created with Education officer, environmentalDragon speech recognition software and smart phrase technology. Any transcriptional errors are unintentional.   Doris CheadleADVANI, Xyler Terpening, MD

## 2014-06-20 NOTE — Progress Notes (Signed)
Establish Care HFU syncope  Complaining of drainage and pain on Rt ear

## 2014-06-21 LAB — COMPLETE METABOLIC PANEL WITH GFR
ALT: 18 U/L (ref 0–35)
AST: 20 U/L (ref 0–37)
Albumin: 4.4 g/dL (ref 3.5–5.2)
Alkaline Phosphatase: 63 U/L (ref 39–117)
BUN: 12 mg/dL (ref 6–23)
CALCIUM: 9.9 mg/dL (ref 8.4–10.5)
CO2: 26 mEq/L (ref 19–32)
Chloride: 107 mEq/L (ref 96–112)
Creat: 0.56 mg/dL (ref 0.50–1.10)
Glucose, Bld: 89 mg/dL (ref 70–99)
Potassium: 5.6 mEq/L — ABNORMAL HIGH (ref 3.5–5.3)
Sodium: 141 mEq/L (ref 135–145)
Total Bilirubin: 0.4 mg/dL (ref 0.2–1.2)
Total Protein: 7.3 g/dL (ref 6.0–8.3)

## 2014-06-21 LAB — TSH: TSH: 1.677 u[IU]/mL (ref 0.350–4.500)

## 2014-06-21 LAB — VITAMIN D 25 HYDROXY (VIT D DEFICIENCY, FRACTURES): VIT D 25 HYDROXY: 59 ng/mL (ref 30–100)

## 2014-06-23 ENCOUNTER — Telehealth: Payer: Self-pay

## 2014-06-23 NOTE — Telephone Encounter (Signed)
-----   Message from Doris Cheadleeepak Advani, MD sent at 06/23/2014  9:45 AM EDT ----- Blood work reviewed noticed hemoglobin A1c of 5.8%, patient has prediabetes, call and advise patient for low carbohydrate diet. Also  noticed borderline elevated potassium, advise patient to avoid high potassium containing foods.will repeat blood chemistry on the following visit.

## 2014-06-23 NOTE — Telephone Encounter (Signed)
Patient not available Unable to leave message Voice mail box is full

## 2014-07-16 ENCOUNTER — Ambulatory Visit: Payer: Self-pay

## 2014-08-13 ENCOUNTER — Emergency Department (HOSPITAL_BASED_OUTPATIENT_CLINIC_OR_DEPARTMENT_OTHER)
Admission: EM | Admit: 2014-08-13 | Discharge: 2014-08-13 | Disposition: A | Discharge status: Home / Self Care | Payer: Self-pay | Attending: Emergency Medicine | Admitting: Emergency Medicine

## 2014-08-13 ENCOUNTER — Encounter (HOSPITAL_BASED_OUTPATIENT_CLINIC_OR_DEPARTMENT_OTHER): Payer: Self-pay

## 2014-08-13 DIAGNOSIS — S0101XA Laceration without foreign body of scalp, initial encounter: Secondary | ICD-10-CM | POA: Insufficient documentation

## 2014-08-13 DIAGNOSIS — Y998 Other external cause status: Secondary | ICD-10-CM | POA: Insufficient documentation

## 2014-08-13 DIAGNOSIS — Z720 Tobacco use: Secondary | ICD-10-CM | POA: Insufficient documentation

## 2014-08-13 DIAGNOSIS — Y9301 Activity, walking, marching and hiking: Secondary | ICD-10-CM | POA: Insufficient documentation

## 2014-08-13 DIAGNOSIS — Z8669 Personal history of other diseases of the nervous system and sense organs: Secondary | ICD-10-CM | POA: Insufficient documentation

## 2014-08-13 DIAGNOSIS — Y9289 Other specified places as the place of occurrence of the external cause: Secondary | ICD-10-CM | POA: Insufficient documentation

## 2014-08-13 DIAGNOSIS — Y288XXA Contact with other sharp object, undetermined intent, initial encounter: Secondary | ICD-10-CM | POA: Insufficient documentation

## 2014-08-13 MED ORDER — LIDOCAINE-EPINEPHRINE-TETRACAINE (LET) SOLUTION
3.0000 mL | Freq: Once | NASAL | Status: AC
  Administered 2014-08-13: 3 mL via TOPICAL
  Filled 2014-08-13: qty 3

## 2014-08-13 NOTE — ED Notes (Signed)
Hit head on rusty pipe with lac an bleeding PTA-no bleeding at present

## 2014-08-13 NOTE — Discharge Instructions (Signed)
Take tylenol, motrin for pain.   Keep wound clean and dry.   Staple removal in a week with your doctor or urgent care.   Return to ER if you have purulent discharge from the wound, severe pain, fevers.

## 2014-08-13 NOTE — ED Provider Notes (Signed)
CSN: [id number]     Arrival date & time 08/13/14  1355 History   First MD Initiated Contact with Patient 08/13/14 1406     Chief Complaint  Patient presents with  . Head Injury     (Consider location/radiation/quality/duration/timing/severity/associated sxs/prior Treatment) The history is provided by the patient.  Jennifer Stanton is a 54 y.o. female hx of previous seizure not on meds, here with head injury. Patient was walking and accidentally hit her head on a rusty pipe fell 2 hours prior to arrival. She said it was bleeding a lot but she was able to stop the bleeding. Tetanus is up-to-date (given in 2014).    Past Medical History  Diagnosis Date  . Seizures    Past Surgical History  Procedure Laterality Date  . Inner ear surgery  at age 54  . Tubal ligation  Jul 05, 1991   Family History  Problem Relation Age of Onset  . Cancer Mother   . COPD Mother   . Heart disease Father   . Hemochromatosis Brother    History  Substance Use Topics  . Smoking status: Current Every Day Smoker  . Smokeless tobacco: Not on file  . Alcohol Use: No   OB History    No data available     Review of Systems  Skin: Positive for wound.  All other systems reviewed and are negative.     Allergies  Codeine  Home Medications   Prior to Admission medications   Medication Sig Start Date End Date Taking? Authorizing Provider  amitriptyline (ELAVIL) 10 MG tablet Take 1 tablet (10 mg total) by mouth at bedtime. 06/20/14   Doris Cheadleeepak Advani, MD  ibuprofen (ADVIL,MOTRIN) 200 MG tablet Take 200 mg by mouth every 6 (six) hours as needed.    Historical Provider, MD   BP 114/77 mmHg  Pulse 70  Temp(Src) 98 F (36.7 C) (Oral)  Resp 18  Ht 5\' 6"  (1.676 m)  Wt 135 lb (61.236 kg)  BMI 21.80 kg/m2  SpO2 100% Physical Exam  Constitutional: She is oriented to person, place, and time.  Uncomfortable   HENT:  Head: Normocephalic.  2 cm laceration forehead just within the hair line with dry blood,  well approximated   Eyes: Conjunctivae are normal. Pupils are equal, round, and reactive to light.  Neck: Normal range of motion. Neck supple.  Cardiovascular: Normal rate, regular rhythm and normal heart sounds.   Pulmonary/Chest: Effort normal and breath sounds normal. No respiratory distress. She has no wheezes. She has no rales.  Abdominal: Soft. Bowel sounds are normal. She exhibits no distension. There is no tenderness. There is no rebound and no guarding.  Musculoskeletal: Normal range of motion. She exhibits no edema or tenderness.  Neurological: She is alert and oriented to person, place, and time. No cranial nerve deficit. Coordination normal.  Skin: Skin is warm and dry.  Psychiatric: She has a normal mood and affect. Her behavior is normal. Judgment and thought content normal.  Nursing note and vitals reviewed.   ED Course  Procedures (including critical care time)  LACERATION REPAIR Performed by: Chaney MallingYAO, Maud Rubendall Authorized by: Chaney MallingYAO, Jimie Kuwahara Consent: Verbal consent obtained. Risks and benefits: risks, benefits and alternatives were discussed Consent given by: patient Patient identity confirmed: provided demographic data Prepped and Draped in normal sterile fashion Wound explored  Laceration Location: scalp  Laceration Length: 2 cm  No Foreign Bodies seen or palpated  Anesthesia: none  Local anesthetic:none    Irrigation method: syringe  Amount of cleaning: standard  Skin closure: staple  Number of staples: 2  Patient tolerance: Patient tolerated the procedure well with no immediate complications.   Labs Review Labs Reviewed - No data to display  Imaging Review No results found.   EKG Interpretation None      MDM   Final diagnoses:  None   Jennifer Stanton is a 55 y.o. female here with small scalp laceration. Tdap up to date. Wound cleaned and no obvious foreign body visualized. Staples applied. Offered empiric abx but she states that she can't afford  abx. Told her to watch for signs of wound infection.     Richardean Canal, MD 08/13/14 [phone]

## 2014-08-20 ENCOUNTER — Encounter (HOSPITAL_COMMUNITY): Payer: Self-pay | Admitting: Emergency Medicine

## 2014-08-20 ENCOUNTER — Emergency Department (HOSPITAL_COMMUNITY)
Admission: EM | Admit: 2014-08-20 | Discharge: 2014-08-20 | Disposition: A | Discharge status: Home / Self Care | Payer: Self-pay | Attending: Emergency Medicine | Admitting: Emergency Medicine

## 2014-08-20 DIAGNOSIS — Z4802 Encounter for removal of sutures: Secondary | ICD-10-CM | POA: Insufficient documentation

## 2014-08-20 DIAGNOSIS — Z720 Tobacco use: Secondary | ICD-10-CM | POA: Insufficient documentation

## 2014-08-20 NOTE — ED Notes (Signed)
Pt here for staple removal  

## 2014-08-20 NOTE — ED Provider Notes (Signed)
CSN: [id number]     Arrival date & time 08/20/14  1745 History   This chart was scribed for non-physician practitioner, Raymon Mutton, PA-C, working with Purvis Sheffield, MD, by Abel Presto, ED Scribe. This patient was seen in room TR06C/TR06C and the patient's care was started at 7:25 PM.    Chief Complaint  Patient presents with  . Suture / Staple Removal     The history is provided by the patient. No language interpreter was used.   HPI Comments: Jennifer Stanton is a 54 y.o. female with PMHx of seizures who presents to the Emergency Department for staple removal. Pt was seen at [facility] on 08/13/14 following head injury after hitting head on rusty pipe while walking with 2 staples placed. Pt denies drainage, loss of vision or blurred vision, headache, dizziness, nausea, vomiting, fainting, LOC, chest pain, SOB, and dyspnea.  PCP none.   Past Medical History  Diagnosis Date  . Seizures    Past Surgical History  Procedure Laterality Date  . Inner ear surgery  at age 46  . Tubal ligation  Jul 05, 1991   Family History  Problem Relation Age of Onset  . Cancer Mother   . COPD Mother   . Heart disease Father   . Hemochromatosis Brother    History  Substance Use Topics  . Smoking status: Current Every Day Smoker  . Smokeless tobacco: Not on file  . Alcohol Use: No   OB History    No data available     Review of Systems  Eyes: Negative for visual disturbance.  Respiratory: Negative for shortness of breath.   Cardiovascular: Negative for chest pain.  Gastrointestinal: Negative for nausea, vomiting and diarrhea.  Skin:       staples  Neurological: Negative for dizziness, syncope and headaches.      Allergies  Codeine  Home Medications   Prior to Admission medications   Medication Sig Start Date End Date Taking? Authorizing Provider  amitriptyline (ELAVIL) 10 MG tablet Take 1 tablet (10 mg total) by mouth at bedtime. 06/20/14   Doris Cheadle, MD  ibuprofen  (ADVIL,MOTRIN) 200 MG tablet Take 200 mg by mouth every 6 (six) hours as needed.    Historical Provider, MD   BP 120/75 mmHg  Pulse 70  Temp(Src) 98.2 F (36.8 C) (Oral)  Resp 16  SpO2 100% Physical Exam  Constitutional: She is oriented to person, place, and time. She appears well-developed and well-nourished. No distress.  HENT:  Head: Normocephalic and atraumatic.  2 staples identified to the frontal aspect of the forehead with negative dehiscence, drainage, bleeding, lesions, sores, areas of cellulitic infection. Negative swelling noted.  Eyes: Conjunctivae and EOM are normal. Pupils are equal, round, and reactive to light. Right eye exhibits no discharge. Left eye exhibits no discharge.  Neck: Normal range of motion. Neck supple.  Cardiovascular: Normal rate, regular rhythm and normal heart sounds.   Pulses:      Radial pulses are 2+ on the right side, and 2+ on the left side.  Pulmonary/Chest: Effort normal and breath sounds normal. No respiratory distress. She has no wheezes. She has no rales.  Musculoskeletal: Normal range of motion.  Full ROM to upper and lower extremities without difficulty noted, negative ataxia noted.  Neurological: She is alert and oriented to person, place, and time. No cranial nerve deficit. She exhibits normal muscle tone. Coordination normal. GCS eye subscore is 4. GCS verbal subscore is 5. GCS motor subscore is 6.  Skin: Skin is warm and dry. No rash noted. She is not diaphoretic. No erythema.  Psychiatric: She has a normal mood and affect. Her behavior is normal. Thought content normal.  Nursing note and vitals reviewed.   ED Course  Procedures (including critical care time) DIAGNOSTIC STUDIES: Oxygen Saturation is 99% on room air, normal by my interpretation.    COORDINATION OF CARE: 7:32 PM Discussed treatment plan with patient at beside, the patient agrees with the plan and has no further questions at this time.   Labs Review Labs Reviewed -  No data to display  Imaging Review No results found.   EKG Interpretation None      MDM   Final diagnoses:  Encounter for staple removal    Medications - No data to display  Filed Vitals:   08/20/14 1801 08/20/14 1941  BP: 124/90 120/75  Pulse: 94 70  Temp: 98.2 F (36.8 C)   TempSrc: Oral   Resp: 18 16  SpO2: 99% 100%   I personally performed the services described in this documentation, which was scribed in my presence. The recorded information has been reviewed and is accurate.  This provider reviewed the patient's chart. Patient was seen and assessed in ED setting on 08/13/2014 regarding head laceration. Patient had 2 staples placed at this time. Patient is up-to-date with her vaccination for tetanus, reported that her last tetanus shot was October 2014. 2 staples identified to the frontal aspect of the scalp. Wound appears well with negative dehiscence, drainage, bleeding, bruising. Negative signs of cellulitic infection. 2 staples removed without difficulty. Cranial nerves III through XII grossly intact. Patient stable, afebrile. Patient not septic appearing. Negative signs respiratory distress. Discharged patient. Discussed with patient proper wound care after staples removed. Discussed with patient to closely monitor symptoms and if symptoms are to worsen or change to report back to the ED - strict return instructions given.  Patient agreed to plan of care, understood, all questions answered.   Raymon Mutton, PA-C 08/20/14 1944  Purvis Sheffield, MD 08/21/14 [phone]

## 2014-08-20 NOTE — Discharge Instructions (Signed)
Please call your doctor for a followup appointment within 24-48 hours. When you talk to your doctor please let them know that you were seen in the emergency department and have them acquire all of your records so that they can discuss the findings with you and formulate a treatment plan to fully care for your new and ongoing problems. Please follow-up with health and wellness Center Please apply warm compressions and massage Please keep site clean Please continue to monitor symptoms closely and if symptoms are to worsen or change (fever greater than 101, chills, sweating, nausea, vomiting, chest pain, shortness of breathe, difficulty breathing, weakness, numbness, tingling, worsening or changes to pain pattern, dizziness, fainting, visual changes, neck pain, neck stiffness, fainting, seizure activity) please report back to the Emergency Department immediately.   Staple Removal, Care After The staples that were used to close your skin have been removed. The care described here will need to continue until the wound is completely healed and your health care provider confirms that wound care can be stopped. HOME CARE INSTRUCTIONS   Keep the wound site dry and clean. Do not soak it in water.  If skin adhesive strips were applied after the staples were removed, they will begin to peel off in a few days. Allow them to remain in place until they fall off on their own.  If you still have a bandage (dressing), change it at least once a day or as directed by your health care provider. If the dressing sticks, pour warm, sterile water over it until it loosens and can be removed without pulling apart the wound edges. Pat dry with a clean towel.  Apply cream or ointment that stops the growth of bacteria (antibacterial cream or ointment) only if your health care provider has directed you to do so. Place a nonstick bandage over the wound to prevent the dressing from sticking.  Cover the nonstick bandage with a new  dressing as directed by your health care provider.  If the bandage becomes wet, dirty, or develops a bad smell, change it as soon as possible.  New scars become sunburned easily. Use sunscreens with a sun protection factor (SPF) of at least 15 when out in the sun. Reapply the SPF every 2 hours.  Only take medicines as directed by your health care provider. SEEK IMMEDIATE MEDICAL CARE IF:   You have redness, swelling, or increasing pain in the wound.  You have pus coming from the wound.  You have a fever.  You notice a bad smell coming from the wound or dressing.  Your wound edges open up after staples have been removed. MAKE SURE YOU:   Understand these instructions.  Will watch your condition.  Will get help right away if you are not doing well or get worse. Document Released: 02/04/2008 Document Revised: 02/26/2013 Document Reviewed: 02/04/2008 [facility] Patient Information 2015 [place], [place]. This information is not intended to replace advice given to you by your health care provider. Make sure you discuss any questions you have with your health care provider.   Emergency Department Resource Guide 1) Find a Doctor and Pay Out of Pocket Although you won't have to find out who is covered by your insurance plan, it is a good idea to ask around and get recommendations. You will then need to call the office and see if the doctor you have chosen will accept you as a new patient and what types of options they offer for patients who are self-pay. Some doctors  offer discounts or will set up payment plans for their patients who do not have insurance, but you will need to ask so you aren't surprised when you get to your appointment.  2) Contact Your Local Health Department Not all health departments have doctors that can see patients for sick visits, but many do, so it is worth a call to see if yours does. If you don't know where your local health department is, you can check in your phone  book. The CDC also has a tool to help you locate your state's health department, and many state websites also have listings of all of their local health departments.  3) Find a Walk-in Clinic If your illness is not likely to be very severe or complicated, you may want to try a walk in clinic. These are popping up all over the country in pharmacies, drugstores, and shopping centers. They're usually staffed by nurse practitioners or physician assistants that have been trained to treat common illnesses and complaints. They're usually fairly quick and inexpensive. However, if you have serious medical issues or chronic medical problems, these are probably not your best option.  No Primary Care Doctor: - Call Health Connect at  [phone] - they can help you locate a primary care doctor that  accepts your insurance, provides certain services, etc. - Physician Referral Service- [phone]  Chronic Pain Problems: Organization         Address  Phone   Notes  [facility]  [phone] Patients need to be referred by their primary care doctor.   Medication Assistance: Organization         Address  Phone   Notes  [facility] Medication [facility] [address] [place] [place], [place] [address] [phone] --Must be a resident of [facility] -- Must have NO insurance coverage whatsoever (no Medicaid/ Medicare, etc.) -- The pt. MUST have a primary care doctor that directs their care regularly and follows them in the community   [facility]  [phone]   [employer]  [phone]    Agencies that provide inexpensive medical care: Organization         Address  Phone   Notes  [facility]  [phone]   [facility]    [phone]   [facility] [address] [place], [place] [address] [phone]   [facility] [address] [place], [place] [phone]   Planned  Parenthood    [phone]   [facility]    [phone]   [facility]  [address], [place] Phone:  [phone], Fax:  [phone] Hours of Operation:  9 am - 6 pm, M-F.  Also accepts Medicaid/Medicare and self-pay.  [facility]  [address], [place] Phone: [phone], Fax: [phone]. Hours of Operation:  8:30 am - 5:30 pm, M-F.  Also accepts Medicaid and self-pay.  [facility] [place] [address] [place] Phone: [phone]   [facility] [address] Natasha Bence [place], [place] [phone], Ext. [phone] Mondays & Thursdays: 7-9 AM.  First 15 patients are seen on a first come, first serve basis.    Medicaid-accepting [facility] Providers:  Organization         Address  Phone   Notes  [facility] [address], Laurell Josephs  A, [place] [phone] Also accepts self-pay patients.  [facility] [address] Laurell Josephs [place], [place]  [phone]   [facility] [address], [place] [phone]   [facility] [address] [place] [phone]   Renaye Rakers [address], [place]   [phone] Only accepts [place] patients after they have their name applied to their card.   Self-Pay (no insurance) in [facility]:  Organization         Address  Phone   Notes  Sickle Cell Patients, [facility] Internal Medicine [address] [place], [place] [phone]   [facility] [address] [place], [place] [phone]   [facility] [place]  [address], [place] [phone]   [facility]/Dr. Osei-Bonsu  [address] [place] or [address], [place] [phone] Phone number for both [place] and [place] locations is the same.    [facility] [address] [place] [phone]   [facility] [address] [place] or [address] [phone]   [facility] [address], [place] [phone], phone; [phone], fax Sees patients 1st and 3rd Saturday of every month.  Must not qualify for public or private insurance (i.e. Medicaid, Medicare, [place] Health Choice, Veterans' Benefits)  Household income should be no more than 200% of the poverty level The clinic cannot treat you if you are pregnant or think you are pregnant  Sexually transmitted diseases are not treated at the clinic.    Dental Care: Organization         Address  Phone  Notes  [facility] Department of [facility] [address] [place], [place] [phone] Accepts children up to age 1 who are enrolled in [place] or [place] Health Choice; pregnant women with a Medicaid card; and children who have applied for Medicaid or [place] Health Choice, but were declined, whose parents can pay a reduced fee at time of service.  [facility] Department of [facility]  [address], [place] [phone] Accepts children up to age 65 who are enrolled in [place] or [place] Health Choice; pregnant women with a Medicaid card; and children who have applied for Medicaid or [place] Health Choice, but were declined, whose parents can pay a reduced fee at time of service.  [facility]  [address] [place], [place] [phone] Patients are seen by appointment only. Walk-ins are not accepted. [facility] will see patients 43 years of age and older. Monday - Tuesday (8am-5pm) Most Wednesdays (8:30-5pm) $30 per visit, cash only  [facility] Adult Dental Access PRO[facility]  [address], [facility] [phone] Patients are seen by appointment only. Walk-ins are not accepted. [facility] will see patients 109  years of age and older. One Wednesday Evening (Monthly: Volunteer Based).  $30 per visit, cash only  [employer]  [phone] for adults; Children under age 83, call Graduate Pediatric Dentistry at [phone]. Children aged 2-14, please call [phone] to request a pediatric application.  Dental services are provided in all areas of dental care including fillings, crowns and bridges, complete and partial dentures, implants, gum treatment, root canals, and extractions. Preventive  care is also provided. Treatment is provided to both adults and children. Patients are selected via a lottery and there is often a waiting list.   [facility] [address], [place]  [phone] [email or web address]   [facility] [address] [place], [place] [phone], Ext. [phone] Second and Fourth Thursday of each month, opens at 6:30 AM; Clinic ends at 9 AM.  Patients are seen on a first-come first-served basis, and a limited number are seen during each clinic.   [facility]  [address] Ether [place], [place] [phone]   Eligibility Requirements You must have lived in [place], [place], or [place] counties for at least the last three months.   You cannot be eligible for state or federal sponsored [place], including [employer], [place], or [employer].   You generally cannot be eligible for healthcare insurance through your employer.    How to apply: Eligibility screenings are held every Tuesday and Wednesday afternoon from 1:00 pm until 4:00 pm. You do not need an appointment for the interview!  [facility] [address], [place], [place] [phone]   [facility] Health Department  [phone]   [facility] Health Department  [phone]   [facility] Health Department  [phone]    Behavioral Health Resources in the Community: Intensive Outpatient  Programs Organization         Address  Phone  Notes  [facility] Services [address], [place], [place] [phone]   [facility] Outpatient [address] [place], [place] [phone]   [facility] [address] [place], [place]  [phone]   [facility] Mental Health [address]  [place], [place] [phone] or [phone]   Substance Abuse Resources Organization         Address  Phone  Notes  [facility] Drug Services  [phone]   [facility]  [phone]   [facility] [place]  [phone]   Floydene Flock  [phone]   Residential & Outpatient Substance Abuse Program  [phone]   Psychological Services Organization         Address  Phone  Notes  [facility] Behavioral Health  [phone]   [facility] Services  [phone]   [facility] Mental Health [address] [place] [phone] or [phone]    Mobile Crisis Teams Organization         Address  Phone  Notes  [facility], Mobile Crisis Care Unit  [phone]   [facility]  [address] [place], [place] [phone]   Doristine Locks [address] [place] [phone]    Self-Help/Support Groups Organization         Address  Phone             Notes  [facility] [place] - variety of support groups  336- I7437963 Call for more information  Narcotics Anonymous (NA), [facility] [address], [employer] [place]  2 meetings at this location   Statistician         Address  Phone  Notes  [facility] [address],    [place]  [phone]   [facility]  [address], [place] [address], [place], [place] [phone]   [facility] [address] [place], [place] [phone] Admissions: 8am-3pm M-F  [facility] [address]  [place], [place]   [facility] [address] Jadene Pierini [place], [place]   The [place].,  [place], [place]   [facility] - Intensive Outpatient [address] Kristeen [address], [place], [place]   [facility] [address]  [place], [place] [phone] or [phone]   [facility] ([facility]) [address], [place], [place] Accepts Medicaid  [facility] [address],  [place] [phone] Substance Abuse/Addiction Treatment   [facility] Organization         Address  Phone  Notes  [facility]  [phone]   Domenic Schwab, PhD [address] Arlis Porta [place], [place]   [phone] or [phone]   [place], [place] [phone]   [facility] [address], [place], [place] [phone] Insurance/Medicaid/sponsorship through [facility] Families [address]                                    [place], [place] [phone] [place] [address] [place], [place] [phone]    Dr. Adele Schilder  [phone]   [facility] Dept. 1) [address] [place] 2) [place] 3)  [address], [place] [phone]  [phone]   [place] [phone] or [phone] (After Hours)

## 2014-10-26 ENCOUNTER — Emergency Department (HOSPITAL_COMMUNITY)
Admission: EM | Admit: 2014-10-26 | Discharge: 2014-10-26 | Disposition: A | Discharge status: Home / Self Care | Payer: Self-pay | Attending: Emergency Medicine | Admitting: Emergency Medicine

## 2014-10-26 ENCOUNTER — Encounter (HOSPITAL_COMMUNITY): Payer: Self-pay

## 2014-10-26 ENCOUNTER — Emergency Department (HOSPITAL_COMMUNITY): Payer: Self-pay

## 2014-10-26 DIAGNOSIS — Z79899 Other long term (current) drug therapy: Secondary | ICD-10-CM | POA: Insufficient documentation

## 2014-10-26 DIAGNOSIS — Y9389 Activity, other specified: Secondary | ICD-10-CM | POA: Insufficient documentation

## 2014-10-26 DIAGNOSIS — M25511 Pain in right shoulder: Secondary | ICD-10-CM

## 2014-10-26 DIAGNOSIS — Z720 Tobacco use: Secondary | ICD-10-CM | POA: Insufficient documentation

## 2014-10-26 DIAGNOSIS — Y92410 Unspecified street and highway as the place of occurrence of the external cause: Secondary | ICD-10-CM | POA: Insufficient documentation

## 2014-10-26 DIAGNOSIS — Y998 Other external cause status: Secondary | ICD-10-CM | POA: Insufficient documentation

## 2014-10-26 DIAGNOSIS — S4991XA Unspecified injury of right shoulder and upper arm, initial encounter: Secondary | ICD-10-CM | POA: Insufficient documentation

## 2014-10-26 MED ORDER — CYCLOBENZAPRINE HCL 10 MG PO TABS: 10.0000 mg | ORAL_TABLET | Freq: Two times a day (BID) | ORAL | Status: DC | PRN

## 2014-10-26 MED ORDER — CYCLOBENZAPRINE HCL 10 MG PO TABS
10.0000 mg | ORAL_TABLET | Freq: Once | ORAL | Status: AC
Start: 2014-10-26 — End: 2014-10-26
  Administered 2014-10-26: 10 mg via ORAL
  Filled 2014-10-26: qty 1
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
## 2014-10-26 NOTE — ED Notes (Signed)
Declined W/C at D/C and was escorted to lobby by RN. 
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
## 2014-10-26 NOTE — Discharge Instructions (Signed)
Please take Flexeril as prescribed. You may also take Ibuprofen as prescribed for pain relief. Please follow up with Orthopedics in 4-5 days if symptoms have not improved. Please return to the Emergency department if symptoms worsen.

## 2015-05-05 ENCOUNTER — Emergency Department (HOSPITAL_BASED_OUTPATIENT_CLINIC_OR_DEPARTMENT_OTHER)
Admission: EM | Admit: 2015-05-05 | Discharge: 2015-05-05 | Disposition: A | Discharge status: Home / Self Care | Payer: Self-pay | Attending: Physician Assistant | Admitting: Physician Assistant

## 2015-05-05 ENCOUNTER — Encounter (HOSPITAL_BASED_OUTPATIENT_CLINIC_OR_DEPARTMENT_OTHER): Payer: Self-pay | Admitting: *Deleted

## 2015-05-05 DIAGNOSIS — M62838 Other muscle spasm: Secondary | ICD-10-CM

## 2015-05-05 DIAGNOSIS — M62830 Muscle spasm of back: Secondary | ICD-10-CM | POA: Insufficient documentation

## 2015-05-05 DIAGNOSIS — F17210 Nicotine dependence, cigarettes, uncomplicated: Secondary | ICD-10-CM | POA: Insufficient documentation

## 2015-05-05 MED ORDER — IBUPROFEN 800 MG PO TABS: 800.0000 mg | ORAL_TABLET | Freq: Three times a day (TID) | ORAL | Status: DC

## 2015-05-05 MED ORDER — CYCLOBENZAPRINE HCL 10 MG PO TABS: 10.0000 mg | ORAL_TABLET | Freq: Two times a day (BID) | ORAL | Status: DC | PRN

## 2015-05-05 MED FILL — CYCLOBENZAPRINE 10 MG TAB: 10 | 5 days supply | Qty: 10 | Fill #0

## 2015-05-05 MED FILL — IBUPROFEN 800 MG TABLET: 800 | 7 days supply | Qty: 21 | Fill #0

## 2015-05-05 NOTE — ED Notes (Signed)
Back pain for a few days. This am the pain was in her left scapula. She has been taking Ibuprofen.

## 2015-05-05 NOTE — ED Provider Notes (Signed)
CSN: [id number]     Arrival date & time 05/05/15  1237 History   First MD Initiated Contact with Patient 05/05/15 1245     Chief Complaint  Patient presents with  . Back Pain     (Consider location/radiation/quality/duration/timing/severity/associated sxs/prior Treatment) HPI  Patient is a 55 year old female presenting with muscle pain in her left back. Patient reports she got off the couch of the day and felt her muscle tighten up in her left back.  She reported tensing got worse this morning. She tried heat and cold at home and this helped.  She is tried ibuprofen which has helped minimally.  No weakness fever loss of bowel or bladder or trauma.  Past Medical History  Diagnosis Date  . Seizures [facility])    Past Surgical History  Procedure Laterality Date  . Inner ear surgery  at age 49  . Tubal ligation  Jul 05, 1991   Family History  Problem Relation Age of Onset  . Cancer Mother   . COPD Mother   . Heart disease Father   . Hemochromatosis Brother    Social History  Substance Use Topics  . Smoking status: Current Every Day Smoker -- 0.10 packs/day    Types: Cigarettes  . Smokeless tobacco: None  . Alcohol Use: No   OB History    No data available     Review of Systems  Constitutional: Negative for fever and activity change.  Respiratory: Negative for shortness of breath.   Cardiovascular: Negative for chest pain.  Gastrointestinal: Negative for abdominal pain.  Musculoskeletal: Positive for back pain.  Neurological: Negative for headaches.      Allergies  Codeine  Home Medications   Prior to Admission medications   Medication Sig Start Date End Date Taking? Authorizing Provider  amitriptyline (ELAVIL) 10 MG tablet Take 1 tablet (10 mg total) by mouth at bedtime. 06/20/14   Doris Cheadle, MD  cyclobenzaprine (FLEXERIL) 10 MG tablet Take 1 tablet (10 mg total) by mouth 2 (two) times daily as needed for muscle spasms. 10/26/14   Barrett Henle, PA-C   ibuprofen (ADVIL,MOTRIN) 200 MG tablet Take 200 mg by mouth every 6 (six) hours as needed.    Historical Provider, MD   BP 123/84 mmHg  Pulse 89  Temp(Src) 98.8 F (37.1 C) (Oral)  Resp 20  Ht  (1.676 m)  Wt 143 lb (64.864 kg)  BMI 23.09 kg/m2  SpO2 99% Physical Exam  Constitutional: She is oriented to person, place, and time. She appears well-developed and well-nourished.  HENT:  Head: Normocephalic and atraumatic.  Eyes: Right eye exhibits no discharge.  Cardiovascular: Normal rate, regular rhythm and normal heart sounds.   No murmur heard. Pulmonary/Chest: Effort normal and breath sounds normal. She has no wheezes. She has no rales.  Musculoskeletal:  Normal range motion. No tenderness to CT or L-spinous processes.. Paraspinal muscular tightness to the lateral T-spine.  Neurological: She is oriented to person, place, and time.  Skin: Skin is warm and dry. She is not diaphoretic.  Psychiatric: She has a normal mood and affect.  Nursing note and vitals reviewed.   ED Course  Procedures (including critical care time) Labs Review Labs Reviewed - No data to display  Imaging Review No results found. I have personally reviewed and evaluated these images and lab results as part of my medical decision-making.   EKG Interpretation None      MDM   Final diagnoses:  None    Patient is  a 55 year old feel presenting with muscle spasm to the left back. Patient had this happen yesterday. Ibuprofen has helped. Heat has helped minimally.  No red flags, no need for imaging at this time.  We'll treat with Flexeril and tramadol. We'll have  patient follow up with primary care physician as needed.    Chynna Buerkle Randall An, MD 05/05/15 1310

## 2015-05-05 NOTE — Discharge Instructions (Signed)
Heat Therapy °Heat therapy can help ease sore, stiff, injured, and tight muscles and joints. Heat relaxes your muscles, which may help ease your pain.  °RISKS AND COMPLICATIONS °If you have any of the following conditions, do not use heat therapy unless your health care provider has approved: °· Poor circulation. °· Healing wounds or scarred skin in the area being treated. °· Diabetes, heart disease, or high blood pressure. °· Not being able to feel (numbness) the area being treated. °· Unusual swelling of the area being treated. °· Active infections. °· Blood clots. °· Cancer. °· Inability to communicate pain. This may include young children and people who have problems with their brain function (dementia). °· Pregnancy. °Heat therapy should only be used on old, pre-existing, or long-lasting (chronic) injuries. Do not use heat therapy on new injuries unless directed by your health care provider. °HOW TO USE HEAT THERAPY °There are several different kinds of heat therapy, including: °· Moist heat pack. °· Warm water bath. °· Hot water bottle. °· Electric heating pad. °· Heated gel pack. °· Heated wrap. °· Electric heating pad. °Use the heat therapy method suggested by your health care provider. Follow your health care provider's instructions on when and how to use heat therapy. °GENERAL HEAT THERAPY RECOMMENDATIONS °· Do not sleep while using heat therapy. Only use heat therapy while you are awake. °· Your skin may turn pink while using heat therapy. Do not use heat therapy if your skin turns red. °· Do not use heat therapy if you have new pain. °· High heat or long exposure to heat can cause burns. Be careful when using heat therapy to avoid burning your skin. °· Do not use heat therapy on areas of your skin that are already irritated, such as with a rash or sunburn. °SEEK MEDICAL CARE IF: °· You have blisters, redness, swelling, or numbness. °· You have new pain. °· Your pain is worse. °MAKE SURE  YOU: °· Understand these instructions. °· Will watch your condition. °· Will get help right away if you are not doing well or get worse. °  °This information is not intended to replace advice given to you by your health care provider. Make sure you discuss any questions you have with your health care provider. °  °Document Released: 05/16/2011 Document Revised: 03/14/2014 Document Reviewed: 04/16/2013 °Elsevier Interactive Patient Education ©2016 [employer] ° °Muscle Cramps and Spasms °Muscle cramps and spasms occur when a muscle or muscles tighten and you have no control over this tightening (involuntary muscle contraction). They are a common problem and can develop in any muscle. The most common place is in the calf muscles of the leg. Both muscle cramps and muscle spasms are involuntary muscle contractions, but they also have differences:  °· Muscle cramps are sporadic and painful. They may last a few seconds to a quarter of an hour. Muscle cramps are often more forceful and last longer than muscle spasms. °· Muscle spasms may or may not be painful. They may also last just a few seconds or much longer. °CAUSES  °It is uncommon for cramps or spasms to be due to a serious underlying problem. In many cases, the cause of cramps or spasms is unknown. Some common causes are:  °· Overexertion.   °· Overuse from repetitive motions (doing the same thing over and over).   °· Remaining in a certain position for a long period of time.   °· Improper preparation, form, or technique while performing a sport or activity.   °·   Dehydration.   °· Injury.   °· Side effects of some medicines.   °· Abnormally low levels of the salts and ions in your blood (electrolytes), especially potassium and calcium. This could happen if you are taking water pills (diuretics) or you are pregnant.   °Some underlying medical problems can make it more likely to develop cramps or spasms. These include, but are not limited to:  °· Diabetes.    °· Parkinson disease.   °· Hormone disorders, such as thyroid problems.   °· Alcohol abuse.   °· Diseases specific to muscles, joints, and bones.   °· Blood vessel disease where not enough blood is getting to the muscles.   °HOME CARE INSTRUCTIONS  °· Stay well hydrated. Drink enough water and fluids to keep your urine clear or pale yellow. °· It may be helpful to massage, stretch, and relax the affected muscle. °· For tight or tense muscles, use a warm towel, heating pad, or hot shower water directed to the affected area. °· If you are sore or have pain after a cramp or spasm, applying ice to the affected area may relieve discomfort. °¨ Put ice in a plastic bag. °¨ Place a towel between your skin and the bag. °¨ Leave the ice on for 15-20 minutes, 03-04 times a day. °· Medicines used to treat a known cause of cramps or spasms may help reduce their frequency or severity. Only take over-the-counter or prescription medicines as directed by your caregiver. °SEEK MEDICAL CARE IF:  °Your cramps or spasms get more severe, more frequent, or do not improve over time.  °MAKE SURE YOU:  °· Understand these instructions. °· Will watch your condition. °· Will get help right away if you are not doing well or get worse. °  °This information is not intended to replace advice given to you by your health care provider. Make sure you discuss any questions you have with your health care provider. °  °Document Released: 08/13/2001 Document Revised: 06/18/2012 Document Reviewed: 02/08/2012 °Elsevier Interactive Patient Education ©2016 Elsevier Inc. ° °

## 2017-02-09 ENCOUNTER — Emergency Department (HOSPITAL_BASED_OUTPATIENT_CLINIC_OR_DEPARTMENT_OTHER): Payer: Self-pay

## 2017-02-09 ENCOUNTER — Other Ambulatory Visit: Payer: Self-pay

## 2017-02-09 ENCOUNTER — Encounter (HOSPITAL_BASED_OUTPATIENT_CLINIC_OR_DEPARTMENT_OTHER): Payer: Self-pay

## 2017-02-09 ENCOUNTER — Emergency Department (HOSPITAL_BASED_OUTPATIENT_CLINIC_OR_DEPARTMENT_OTHER)
Admission: EM | Admit: 2017-02-09 | Discharge: 2017-02-09 | Disposition: A | Discharge status: Home / Self Care | Payer: Self-pay | Attending: Emergency Medicine | Admitting: Emergency Medicine

## 2017-02-09 DIAGNOSIS — F17210 Nicotine dependence, cigarettes, uncomplicated: Secondary | ICD-10-CM | POA: Insufficient documentation

## 2017-02-09 DIAGNOSIS — R5383 Other fatigue: Secondary | ICD-10-CM | POA: Insufficient documentation

## 2017-02-09 LAB — COMPREHENSIVE METABOLIC PANEL
ALT: 22 U/L (ref 14–54)
AST: 25 U/L (ref 15–41)
Albumin: 3.4 g/dL — ABNORMAL LOW (ref 3.5–5.0)
Alkaline Phosphatase: 71 U/L (ref 38–126)
Anion gap: 7 (ref 5–15)
BUN: 11 mg/dL (ref 6–20)
CHLORIDE: 103 mmol/L (ref 101–111)
CO2: 24 mmol/L (ref 22–32)
Calcium: 8.8 mg/dL — ABNORMAL LOW (ref 8.9–10.3)
Creatinine, Ser: 0.39 mg/dL — ABNORMAL LOW (ref 0.44–1.00)
GFR calc non Af Amer: >60 mL/min (ref >60)
Glucose, Bld: 104 mg/dL — ABNORMAL HIGH (ref 65–99)
Potassium: 3.3 mmol/L — ABNORMAL LOW (ref 3.5–5.1)
Sodium: 134 mmol/L — ABNORMAL LOW (ref 135–145)
Total Bilirubin: 0.7 mg/dL (ref 0.3–1.2)
Total Protein: 6.7 g/dL (ref 6.5–8.1)

## 2017-02-09 LAB — URINALYSIS, ROUTINE W REFLEX MICROSCOPIC
BILIRUBIN URINE: NEGATIVE
Glucose, UA: NEGATIVE mg/dL
Ketones, ur: 15 mg/dL — AB
Leukocytes, UA: NEGATIVE
NITRITE: NEGATIVE
PH: 6 (ref 5.0–8.0)
Protein, ur: NEGATIVE mg/dL

## 2017-02-09 LAB — CBC WITH DIFFERENTIAL/PLATELET
Basophils Absolute: 0 10*3/uL (ref 0.0–0.1)
Basophils Relative: 0 %
Eosinophils Absolute: 0 10*3/uL (ref 0.0–0.7)
Eosinophils Relative: 0 %
HEMATOCRIT: 34.8 % — AB (ref 36.0–46.0)
Hemoglobin: 11.7 g/dL — ABNORMAL LOW (ref 12.0–15.0)
LYMPHS ABS: 2.3 10*3/uL (ref 0.7–4.0)
Lymphocytes Relative: 29 %
MCH: 27.4 pg (ref 26.0–34.0)
MCHC: 33.6 g/dL (ref 30.0–36.0)
MCV: 81.5 fL (ref 78.0–100.0)
MONO ABS: 0.8 10*3/uL (ref 0.1–1.0)
MONOS PCT: 10 %
NEUTROS ABS: 4.9 10*3/uL (ref 1.7–7.7)
NEUTROS PCT: 61 %
Platelets: 210 10*3/uL (ref 150–400)
RBC: 4.27 MIL/uL (ref 3.87–5.11)
RDW: 13.7 % (ref 11.5–15.5)
WBC: 8.1 10*3/uL (ref 4.0–10.5)

## 2017-02-09 LAB — D-DIMER, QUANTITATIVE (NOT AT ARMC)

## 2017-02-09 LAB — URINALYSIS, MICROSCOPIC (REFLEX)

## 2017-02-09 LAB — CBG MONITORING, ED: GLUCOSE-CAPILLARY: 116 mg/dL — AB (ref 65–99)

## 2017-02-09 LAB — TROPONIN I: Troponin I: <0.03 ng/mL (ref <0.03)

## 2017-02-09 NOTE — ED Triage Notes (Signed)
C/o fatigue, increase sleep, weight loss, intermittent dizziness x 6 weeks after starting new job-today dizziness worse, dry mouth, increase thirst, c/o pain to back of head-NAD-steady gait

## 2017-02-09 NOTE — ED Provider Notes (Signed)
[facility] EMERGENCY DEPARTMENT Provider Note   CSN: [id number] Arrival date & time: 02/09/17  1218     History   Chief Complaint Chief Complaint  Patient presents with  . Fatigue    HPI Jennifer Stanton is a 56 y.o. female.  HPI 56 year old female with a past medical history of seizures a few days history of dizziness, lightheadedness, dry mouth, increased thirst, nausea, sweating, increased fatigue.  She denies any palpitations but reports of this central chest discomfort that is hard for her to describe.  The chest discomfort is intermittent and happens throughout the day.  No shortness of breath.  This also started a few days ago.  She denies any headaches, blurred vision, abdominal pain, dysuria.  She also reports of increased urinary frequency.  She later on reports that about 6 weeks ago she started a new job where she is very active.  Apparently they do not allow for employees to carry water with them to hydrate themselves.  But she has been trying to hydrate herself.   Past Medical History:  Diagnosis Date  . Seizures [facility])     Patient Active Problem List   Diagnosis Date Noted  . History of seizure 06/20/2014  . Blurry vision 06/20/2014  . Cephalalgia 06/20/2014    Past Surgical History:  Procedure Laterality Date  . INNER EAR SURGERY  at age 9  . TUBAL LIGATION  Jul 05, 1991    OB History    No data available       Home Medications    Prior to Admission medications   Medication Sig Start Date End Date Taking? Authorizing Provider  cyclobenzaprine (FLEXERIL) 10 MG tablet Take 1 tablet (10 mg total) by mouth 2 (two) times daily as needed for muscle spasms. 05/05/15   Mackuen, Courteney Lyn, MD  ibuprofen (ADVIL,MOTRIN) 800 MG tablet Take 1 tablet (800 mg total) by mouth 3 (three) times daily. 05/05/15   Mackuen, Cindee Salt, MD    Family History Family History  Problem Relation Age of Onset  . Cancer Mother   . COPD Mother   . Heart  disease Father   . Hemochromatosis Brother     Social History Social History   Tobacco Use  . Smoking status: Current Every Day Smoker    Packs/day: 0.10    Types: Cigarettes  . Smokeless tobacco: Never Used  Substance Use Topics  . Alcohol use: Yes    Comment: occ  . Drug use: Yes    Types: Marijuana     Allergies   Codeine   Review of Systems Review of Systems  Constitutional: Positive for activity change. Negative for chills and fever.  HENT: Negative for congestion and rhinorrhea.   Eyes: Negative for visual disturbance.  Respiratory: Positive for chest tightness. Negative for cough and shortness of breath.   Cardiovascular: Negative for palpitations and leg swelling.  Gastrointestinal: Negative for abdominal pain, constipation, diarrhea and nausea.       Denies blood in stool but reports of a little bit of blood  on tissue paper after wiping after a bowel movement.  No dripping of blood into the toilet bowl.      Physical Exam Updated Vital Signs BP 106/84 (BP Location: Right Arm)   Pulse (!) 102   Temp 99 F (37.2 C) (Oral)   Resp 20   Ht 5' 6.5" (1.689 m)   Wt 56.7 kg (125 lb)   SpO2 98%   BMI 19.87 kg/m  Physical Exam  Constitutional: She is oriented to person, place, and time. She appears well-developed and well-nourished. No distress.  HENT:  Mouth/Throat: Oropharynx is clear and moist.  Eyes: Conjunctivae are normal. Pupils are equal, round, and reactive to light.  Cardiovascular: Regular rhythm and normal heart sounds.  No murmur heard. Tachycardia  Pulmonary/Chest: Effort normal and breath sounds normal. No stridor. No respiratory distress. She has no wheezes.  Abdominal: Soft. Bowel sounds are normal. She exhibits distension. There is no rebound and no guarding.  Mild suprapubic tenderness to palpation.  Genitourinary:  Genitourinary Comments: Anorectal exam: External exam significant for external hemorrhoids without thrombosis.    Musculoskeletal: She exhibits no edema.  Neurological: She is alert and oriented to person, place, and time. No sensory deficit. She exhibits normal muscle tone.  Skin: Skin is dry. Capillary refill takes less than 2 seconds. She is not diaphoretic.  Psychiatric: She has a normal mood and affect. Her behavior is normal. Judgment and thought content normal.     ED Treatments / Results  Labs (all labs ordered are listed, but only abnormal results are displayed) Labs Reviewed  COMPREHENSIVE METABOLIC PANEL - Abnormal; Notable for the following components:      Result Value   Sodium 134 (*)    Potassium 3.3 (*)    Glucose, Bld 104 (*)    Creatinine, Ser 0.39 (*)    Calcium 8.8 (*)    Albumin 3.4 (*)    All other components within normal limits  CBC WITH DIFFERENTIAL/PLATELET - Abnormal; Notable for the following components:   Hemoglobin 11.7 (*)    HCT 34.8 (*)    All other components within normal limits  URINALYSIS, ROUTINE W REFLEX MICROSCOPIC - Abnormal; Notable for the following components:   Specific Gravity, Urine >1.030 (*)    Hgb urine dipstick TRACE (*)    Ketones, ur 15 (*)    All other components within normal limits  URINALYSIS, MICROSCOPIC (REFLEX) - Abnormal; Notable for the following components:   Bacteria, UA MANY (*)    Squamous Epithelial / LPF 0-5 (*)    All other components within normal limits  CBG MONITORING, ED - Abnormal; Notable for the following components:   Glucose-Capillary 116 (*)    All other components within normal limits  TROPONIN I  D-DIMER, QUANTITATIVE (NOT AT [facility])  CBG MONITORING, ED    EKG  EKG Interpretation  Date/Time:  Thursday February 09 2017 12:28:29 EST Ventricular Rate:  116 PR Interval:    QRS Duration: 95 QT Interval:  325 QTC Calculation: 452 R Axis:   44 Text Interpretation:  Sinus tachycardia Ventricular premature complex Otherwise no significant change Confirmed by Drema Pryardama, Pedro [phone]([id number]) on 02/09/2017 1:17:25 PM        Radiology Dg Chest 2 View  Result Date: 02/09/2017 CLINICAL DATA:  56 year old female with chest discomfort, dizziness for the past few days. Smoker. EXAM: CHEST  2 VIEW COMPARISON:  08/25/2005 chest radiographs. FINDINGS: Lung volumes have mildly increased since 2007 and are at the upper limits of normal to hyperinflated. Mediastinal contours remain normal. Visualized tracheal air column is within normal limits. No pneumothorax, pulmonary edema, pleural effusion or confluent pulmonary opacity. Mild chronic increased interstitial markings. No acute osseous abnormality identified. Negative visible bowel gas pattern. IMPRESSION: No acute cardiopulmonary abnormality. Suspicion of increased pulmonary hyperinflation since 2007. Electronically Signed   By: Odessa FlemingH  Hall M.D.   On: 02/09/2017 13:27    Procedures Procedures (including critical care time)  Medications  Ordered in ED Medications - No data to display   Initial Impression / Assessment and Plan / ED Course  I have reviewed the triage vital signs and the nursing notes.  Pertinent labs & imaging results that were available during my care of the patient were reviewed by me and considered in my medical decision making (see chart for details).  56 year old female presenting with dizziness, lightheadedness, increased thirst, dry mouth.  She was initially tachycardic to 130 but this has self resolved.  Blood pressure is stable.  Patient is afebrile.  She is a well-appearing.  CBC unremarkable except for hemoglobin of 11.7.  Her prior CBC was 2 years ago with a hemoglobin of 15.5.  The MP with very mildly low sodium at 134 which does not explain her symptoms.  Blood glucose 104.  Troponin less than 0.03.  D-dimer is negative.  UA without signs of infection but does show 15 ketones.  Most likely due to decreased oral intake work restrictions.  Discussed importance of proper oral hydration.  Discussed return precautions.  Final Clinical  Impressions(s) / ED Diagnoses   Final diagnoses:  Fatigue, unspecified type    ED Discharge Orders    None       Palma HolterGunadasa, Garcia Dalzell G, MD 02/09/17 1507    Nira Connardama, Pedro Eduardo, MD 02/10/17 [phone]

## 2017-02-09 NOTE — Discharge Instructions (Addendum)
Your symptoms are possibly due to not keeping yourself hydrated. Please make sure you do this. Please see if you are able to get set up with a clinic doctor.

## 2017-02-14 ENCOUNTER — Emergency Department (HOSPITAL_BASED_OUTPATIENT_CLINIC_OR_DEPARTMENT_OTHER)
Admission: EM | Admit: 2017-02-14 | Discharge: 2017-02-14 | Disposition: A | Discharge status: Home / Self Care | Payer: Self-pay | Attending: Emergency Medicine | Admitting: Emergency Medicine

## 2017-02-14 ENCOUNTER — Other Ambulatory Visit: Payer: Self-pay

## 2017-02-14 ENCOUNTER — Encounter (HOSPITAL_BASED_OUTPATIENT_CLINIC_OR_DEPARTMENT_OTHER): Payer: Self-pay | Admitting: Emergency Medicine

## 2017-02-14 DIAGNOSIS — E0590 Thyrotoxicosis, unspecified without thyrotoxic crisis or storm: Secondary | ICD-10-CM | POA: Insufficient documentation

## 2017-02-14 DIAGNOSIS — F17210 Nicotine dependence, cigarettes, uncomplicated: Secondary | ICD-10-CM | POA: Insufficient documentation

## 2017-02-14 LAB — BASIC METABOLIC PANEL
ANION GAP: 7 (ref 5–15)
BUN: 12 mg/dL (ref 6–20)
CHLORIDE: 106 mmol/L (ref 101–111)
CO2: 25 mmol/L (ref 22–32)
Calcium: 9.2 mg/dL (ref 8.9–10.3)
Creatinine, Ser: 0.4 mg/dL — ABNORMAL LOW (ref 0.44–1.00)
GFR calc non Af Amer: >60 mL/min (ref >60)
Glucose, Bld: 120 mg/dL — ABNORMAL HIGH (ref 65–99)
POTASSIUM: 3.3 mmol/L — AB (ref 3.5–5.1)
SODIUM: 138 mmol/L (ref 135–145)

## 2017-02-14 LAB — CBC WITH DIFFERENTIAL/PLATELET
BASOS ABS: 0 10*3/uL (ref 0.0–0.1)
BASOS PCT: 0 %
EOS ABS: 0.1 10*3/uL (ref 0.0–0.7)
Eosinophils Relative: 2 %
HEMATOCRIT: 38.8 % (ref 36.0–46.0)
HEMOGLOBIN: 13 g/dL (ref 12.0–15.0)
LYMPHS PCT: 44 %
Lymphs Abs: 2.8 10*3/uL (ref 0.7–4.0)
MCH: 27.1 pg (ref 26.0–34.0)
MCHC: 33.5 g/dL (ref 30.0–36.0)
MCV: 81 fL (ref 78.0–100.0)
MONOS PCT: 8 %
Monocytes Absolute: 0.5 10*3/uL (ref 0.1–1.0)
NEUTROS PCT: 46 %
Neutro Abs: 2.9 10*3/uL (ref 1.7–7.7)
Platelets: 281 10*3/uL (ref 150–400)
RBC: 4.79 MIL/uL (ref 3.87–5.11)
RDW: 13.6 % (ref 11.5–15.5)
WBC: 6.3 10*3/uL (ref 4.0–10.5)

## 2017-02-14 LAB — TSH

## 2017-02-14 LAB — TROPONIN I

## 2017-02-14 MED ORDER — METOPROLOL TARTRATE 50 MG PO TABS
ORAL_TABLET | ORAL | Status: AC
  Filled 2017-02-14: qty 1

## 2017-02-14 MED ORDER — METOPROLOL TARTRATE 50 MG PO TABS
25.0000 mg | ORAL_TABLET | Freq: Once | ORAL | Status: AC
  Administered 2017-02-14: 25 mg via ORAL

## 2017-02-14 MED ORDER — SODIUM CHLORIDE 0.9 % IV BOLUS (SEPSIS)
1000.0000 mL | Freq: Once | INTRAVENOUS | Status: AC
Start: 2017-02-14 — End: 2017-02-14
  Administered 2017-02-14: 1000 mL via INTRAVENOUS

## 2017-02-14 MED ORDER — METOPROLOL TARTRATE 25 MG PO TABS: 25.0000 mg | ORAL_TABLET | Freq: Two times a day (BID) | ORAL | 0 refills | Status: DC

## 2017-02-14 MED ORDER — METOPROLOL TARTRATE 12.5 MG HALF TABLET
12.5000 mg | ORAL_TABLET | Freq: Once | ORAL | Status: DC
  Filled 2017-02-14: qty 1

## 2017-02-14 MED ORDER — METHIMAZOLE 5 MG PO TABS: 5.0000 mg | ORAL_TABLET | Freq: Three times a day (TID) | ORAL | 0 refills | Status: DC

## 2017-02-14 NOTE — ED Triage Notes (Signed)
Patient states that she is having increased palpitation from when she was seen here on Thursday - she was told to stay off from work - today she went back to work and she started to have her heart race and SOb wit Dizzy again.

## 2017-02-14 NOTE — ED Notes (Signed)
ED Provider at bedside. 

## 2017-02-14 NOTE — Progress Notes (Signed)
ED CM [facility] regarding assistance with ED transitional care. Patient does not have insurance or PCP but reports being employed. D[facility]. Patient is agreeable, with establishing [facility] at [facility] [address].  CM instructed patient contact the office after 9am tomorrow morning to schedule an ED follow up appt, and financial counseling appt as well. patient verbalized understanding and teach back done. ED CM will also follow up with clinic tomorrow. No further ED CM needs identified.

## 2017-02-14 NOTE — ED Provider Notes (Signed)
[facility] EMERGENCY DEPARTMENT Provider Note   CSN: [id number] Arrival date & time: 02/14/17  1623     History   Chief Complaint Chief Complaint  Patient presents with  . Tachycardia    HPI Jennifer Stanton is a 56 y.o. female.  Patient is a 56 year old female with a past history of seizures presenting today with palpitations, dizziness and mild shortness of breath. Patient was seen 7 days ago for similar symptoms. At that time she had a relatively negative workup. She had a normal CMP, d-dimer, troponin. Her EKG showed sinus tachycardia. She had a chest x-ray that showed mild hyperinflation but no other acute findings. Patient rested over the weekend and was feeling much better but went back to work today with return of her symptoms. Upon arrival here patient's initial heart rate was 144 which improved with rest. She also complains of excessive amount of weight loss over the last few months which she attributed to all the exercise she gets at her job. She states she is eating and drinking normally. She does not take any over-the-counter stimulants but does intermittently smoke marijuana as well as cigarettes.  She is denying any upper respiratory symptoms of chest congestion, cough, fever. She has no urinary symptoms and no change in stool.    The history is provided by the patient.    Past Medical History:  Diagnosis Date  . Seizures [facility])     Patient Active Problem List   Diagnosis Date Noted  . History of seizure 06/20/2014  . Blurry vision 06/20/2014  . Cephalalgia 06/20/2014    Past Surgical History:  Procedure Laterality Date  . INNER EAR SURGERY  at age 34  . TUBAL LIGATION  Jul 05, 1991    OB History    No data available       Home Medications    Prior to Admission medications   Medication Sig Start Date End Date Taking? Authorizing Provider  cyclobenzaprine (FLEXERIL) 10 MG tablet Take 1 tablet (10 mg total) by mouth 2 (two) times daily as  needed for muscle spasms. 05/05/15   Mackuen, Courteney Lyn, MD  ibuprofen (ADVIL,MOTRIN) 800 MG tablet Take 1 tablet (800 mg total) by mouth 3 (three) times daily. 05/05/15   Mackuen, Cindee Salt, MD    Family History Family History  Problem Relation Age of Onset  . Cancer Mother   . COPD Mother   . Heart disease Father   . Hemochromatosis Brother     Social History Social History   Tobacco Use  . Smoking status: Current Every Day Smoker    Packs/day: 0.10    Types: Cigarettes  . Smokeless tobacco: Never Used  Substance Use Topics  . Alcohol use: Yes    Comment: occ  . Drug use: Yes    Types: Marijuana    Comment: today      Allergies   Codeine   Review of Systems Review of Systems  All other systems reviewed and are negative.    Physical Exam Updated Vital Signs BP 129/76   Pulse (!) 114   Temp 98.5 F (36.9 C) (Oral)   Resp (!) 31   Ht 5' 6.5" (1.689 m)   SpO2 98%   BMI 19.87 kg/m   Physical Exam  Constitutional: She is oriented to person, place, and time. She appears well-developed and well-nourished. No distress.  HENT:  Head: Normocephalic and atraumatic.  Mouth/Throat: Oropharynx is clear and moist.  Eyes: Conjunctivae and EOM are  normal. Pupils are equal, round, and reactive to light.  Neck: Normal range of motion. Neck supple.  Cardiovascular: Regular rhythm and intact distal pulses. Tachycardia present.  No murmur heard. Pulmonary/Chest: Effort normal and breath sounds normal. No respiratory distress. She has no wheezes. She has no rales.  Abdominal: Soft. She exhibits no distension. There is tenderness in the suprapubic area. There is no rebound and no guarding.  Mild lower abdominal tenderness, no rebound or guarding  Musculoskeletal: Normal range of motion. She exhibits no edema or tenderness.  No unilateral calf pain, swelling or tenderness  Neurological: She is alert and oriented to person, place, and time.  Skin: Skin is warm and dry.  No rash noted. No erythema.  Psychiatric: She has a normal mood and affect. Her behavior is normal.  Nursing note and vitals reviewed.    ED Treatments / Results  Labs (all labs ordered are listed, but only abnormal results are displayed) Labs Reviewed  BASIC METABOLIC PANEL - Abnormal; Notable for the following components:      Result Value   Potassium 3.3 (*)    Glucose, Bld 120 (*)    Creatinine, Ser 0.40 (*)    All other components within normal limits  TSH - Abnormal; Notable for the following components:   TSH <0.010 (*)    All other components within normal limits  CBC WITH DIFFERENTIAL/PLATELET  TROPONIN I    EKG  EKG Interpretation  Date/Time:  Tuesday February 14 2017 16:30:06 EST Ventricular Rate:  133 PR Interval:  134 QRS Duration: 90 QT Interval:  300 QTC Calculation: 446 R Axis:   28 Text Interpretation:  Sinus tachycardia Right atrial enlargement Nonspecific ST and T wave abnormality No significant change since last tracing Confirmed by Gwyneth SproutPlunkett, Naje Rice ([id number]) on 02/14/2017 4:50:36 PM       Radiology No results found.  Procedures Procedures (including critical care time)  Medications Ordered in ED Medications  sodium chloride 0.9 % bolus 1,000 mL (1,000 mLs Intravenous New Bag/Given 02/14/17 1733)     Initial Impression / Assessment and Plan / ED Course  I have reviewed the triage vital signs and the nursing notes.  Pertinent labs & imaging results that were available during my care of the patient were reviewed by me and considered in my medical decision making (see chart for details).     Patient with no significant past medical history returning today with palpitations, dizziness which she describes as lightheadedness and shortness of breath. This has been ongoing for the last several weeks. She was seen 7 days ago for this now that time had a full workup without significant findings. Low suspicion for anemia today. Patient's hemoglobin is  13. There is no electrolyte abnormality or renal issues. Trop is within normal limits. Patient's EKG shows sinus tachycardia but no other acute findings. Patient feels better with sitting down but is still mildly tachycardic on exam. She has clear breath sounds and oxygen saturations in the high 90s. She denies any infectious symptoms such as pneumonia or bronchitis.  Will check TSH given tachycardia, weight loss. Patient also given a liter of fluid.  9:56 PM Heart rate improved with beta blocker. Patient's TSH came back undetectable and concern for hyperthyroidism.  Pt started on b-blocker and methimazole and case manager arranged f/u.  Final Clinical Impressions(s) / ED Diagnoses   Final diagnoses:  Hyperthyroidism    ED Discharge Orders        Ordered    metoprolol tartrate (LOPRESSOR)  25 MG tablet  2 times daily     02/14/17 2218    methimazole (TAPAZOLE) 5 MG tablet  3 times daily     02/14/17 2218       Gwyneth SproutPlunkett, Yetzali Weld, MD 02/14/17 2219

## 2017-02-14 NOTE — ED Notes (Signed)
Could not locate Endocrinologist- Per Dr. Anitra LauthPlunkett[facility] [phone])

## 2017-02-14 NOTE — ED Notes (Signed)
This RN entered the room to inform pt that I had spoken with lab at [facility] and her TSH level should be resulting soon. Pt very angry with wait time. Pt reports, "I have been here for over 7 hours, this mac and cheese is horrible, no one has been in this room in over 2 hours and I am about to pull this IV out myself and leave". Pt informed that it should not be much longer and asked to wait for results. Pt stated she would wait 10 minutes. Pt informed to press call button and notify this RN of leaving so I could take the IV out.

## 2017-02-14 NOTE — ED Notes (Signed)
Pt given chips, mac and cheese, and coke per request

## 2017-02-14 NOTE — ED Triage Notes (Signed)
Attempted valsalva maneuvers - patient had decreased HR x 2 -3 secs and then back to 133

## 2017-03-22 ENCOUNTER — Encounter (HOSPITAL_BASED_OUTPATIENT_CLINIC_OR_DEPARTMENT_OTHER): Payer: Self-pay | Admitting: *Deleted

## 2017-03-22 ENCOUNTER — Emergency Department (HOSPITAL_BASED_OUTPATIENT_CLINIC_OR_DEPARTMENT_OTHER)
Admission: EM | Admit: 2017-03-22 | Discharge: 2017-03-22 | Disposition: A | Discharge status: Home / Self Care | Payer: Self-pay | Attending: Physician Assistant | Admitting: Physician Assistant

## 2017-03-22 ENCOUNTER — Other Ambulatory Visit: Payer: Self-pay

## 2017-03-22 DIAGNOSIS — Z760 Encounter for issue of repeat prescription: Secondary | ICD-10-CM | POA: Insufficient documentation

## 2017-03-22 DIAGNOSIS — Z79899 Other long term (current) drug therapy: Secondary | ICD-10-CM | POA: Insufficient documentation

## 2017-03-22 DIAGNOSIS — E039 Hypothyroidism, unspecified: Secondary | ICD-10-CM | POA: Insufficient documentation

## 2017-03-22 DIAGNOSIS — F17210 Nicotine dependence, cigarettes, uncomplicated: Secondary | ICD-10-CM | POA: Insufficient documentation

## 2017-03-22 HISTORY — DX: Disorder of thyroid, unspecified: E07.9

## 2017-03-22 MED ORDER — METHIMAZOLE 5 MG PO TABS: 5.0000 mg | ORAL_TABLET | Freq: Three times a day (TID) | ORAL | 0 refills | Status: DC

## 2017-03-22 MED ORDER — METOPROLOL TARTRATE 25 MG PO TABS: 25.0000 mg | ORAL_TABLET | Freq: Two times a day (BID) | ORAL | 0 refills | Status: DC

## 2017-03-22 MED FILL — METOPROLOL TARTRATE 25 MG T: 25 | 14 days supply | Qty: 28 | Fill #0

## 2017-03-22 MED FILL — methIMAzole 5 MG TABS: 5 | 14 days supply | Qty: 42 | Fill #0

## 2017-03-22 NOTE — ED Triage Notes (Signed)
Pt  Reports being seen here on 12/11, dx with new hyperthyroidism, rx one month of meds, did not make her follow up apt or apt with financial aid counselor as instructed, requests refill of her meds and states she will make all follow up apts.

## 2017-03-22 NOTE — ED Notes (Signed)
NAD at this time. Pt is stable and going home.  

## 2017-03-22 NOTE — Discharge Instructions (Signed)
These make sure you follow-up with the primary care.  Have given you a short course of your medication.  If you develop any worsening symptoms return to the ED.

## 2017-03-22 NOTE — ED Provider Notes (Signed)
[facility] EMERGENCY DEPARTMENT Provider Note   CSN: [id number] Arrival date & time: 03/22/17  0930     History   Chief Complaint Chief Complaint  Patient presents with  . Medication Refill    HPI Jennifer Stanton is a 57 y.o. female.  HPI 57 year old Caucasian female past medical history with recent diagnosis of hyperthyroid disease presents to the ED requesting medication refill.  Patient was seen on 12/11 and diagnosed with hyperthyroidism after complaining of palpitations, dizziness and shortness of breath.  Patient also had a thorough workup on 12/7 that included normal CMP, d-dimer, troponin.  EKG shows sinus tachycardia at that time.  She reported syncopal episodes and weight loss at that time.  At that time her TSH was undetectable and concern for hyperthyroidism.  She was started on beta-blocker and methimazole.  Patient states that she was told to follow-up with a primary care doctor but she never made an appointment.  She states that she has run out of her medications.  She states that these have been controlling her symptoms fairly well.  She is requesting medication refill until she can follow-up with a primary care doctor.  She states that she will schedule appointment this week.  Denies any other associated symptoms at this time. Past Medical History:  Diagnosis Date  . Seizures ([facility])   . Thyroid disease     Patient Active Problem List   Diagnosis Date Noted  . History of seizure 06/20/2014  . Blurry vision 06/20/2014  . Cephalalgia 06/20/2014    Past Surgical History:  Procedure Laterality Date  . INNER EAR SURGERY  at age 57  . TUBAL LIGATION  Jul 05, 1991    OB History    No data available       Home Medications    Prior to Admission medications   Medication Sig Start Date End Date Taking? Authorizing Provider  cyclobenzaprine (FLEXERIL) 10 MG tablet Take 1 tablet (10 mg total) by mouth 2 (two) times daily as needed for muscle spasms.  05/05/15   Mackuen, Courteney Lyn, MD  ibuprofen (ADVIL,MOTRIN) 800 MG tablet Take 1 tablet (800 mg total) by mouth 3 (three) times daily. 05/05/15   Mackuen, Courteney Lyn, MD  methimazole (TAPAZOLE) 5 MG tablet Take 1 tablet (5 mg total) by mouth 3 (three) times daily. 03/22/17   Demetrios LollLeaphart, Bashir Marchetti T, PA-C  metoprolol tartrate (LOPRESSOR) 25 MG tablet Take 1 tablet (25 mg total) by mouth 2 (two) times daily. 03/22/17   Rise MuLeaphart, Karalyne Nusser T, PA-C    Family History Family History  Problem Relation Age of Onset  . Cancer Mother   . COPD Mother   . Heart disease Father   . Hemochromatosis Brother     Social History Social History   Tobacco Use  . Smoking status: Current Every Day Smoker    Packs/day: 0.10    Types: Cigarettes  . Smokeless tobacco: Never Used  Substance Use Topics  . Alcohol use: Yes    Comment: occ  . Drug use: Yes    Types: Marijuana    Comment: today      Allergies   Codeine   Review of Systems Review of Systems  Constitutional: Negative for chills and fever.  Respiratory: Negative for shortness of breath.   Cardiovascular: Positive for palpitations.  Gastrointestinal: Negative for nausea and vomiting.  Skin: Negative for rash.  Neurological: Negative for headaches.     Physical Exam Updated Vital Signs BP 123/78 (BP Location: Left  Arm)   Pulse (!) 103   Temp 97.9 F (36.6 C) (Oral)   Resp 14   Ht 5\' 6"  (1.676 m)   Wt 56.7 kg (125 lb)   SpO2 100%   BMI 20.18 kg/m   Physical Exam  Constitutional: She appears well-developed and well-nourished. No distress.  HENT:  Head: Normocephalic and atraumatic.  Eyes: Conjunctivae are normal. Pupils are equal, round, and reactive to light. Right eye exhibits no discharge. Left eye exhibits no discharge. No scleral icterus.  Neck: Normal range of motion. Neck supple.  Cardiovascular: Normal rate, regular rhythm, normal heart sounds and intact distal pulses. Exam reveals no gallop and no friction rub.  No  murmur heard. Pulmonary/Chest: Effort normal and breath sounds normal. No stridor. No respiratory distress. She has no wheezes. She has no rales. She exhibits no tenderness.  Musculoskeletal: Normal range of motion.  No lower extremity edema. No calf tenderness.   Neurological: She is alert.  Skin: Skin is warm and dry. Capillary refill takes less than 2 seconds. No rash noted. No pallor.  Psychiatric: Her behavior is normal. Judgment and thought content normal.  Nursing note and vitals reviewed.    ED Treatments / Results  Labs (all labs ordered are listed, but only abnormal results are displayed) Labs Reviewed - No data to display  EKG  EKG Interpretation None       Radiology No results found.  Procedures Procedures (including critical care time)  Medications Ordered in ED Medications - No data to display   Initial Impression / Assessment and Plan / ED Course  I have reviewed the triage vital signs and the nursing notes.  Pertinent labs & imaging results that were available during my care of the patient were reviewed by me and considered in my medical decision making (see chart for details).     Patient resents to the ED for refill of her hypothyroidism medication.  Patient was recently diagnosed on 12/11 with hypothyroidism after being seen several times in the ED for syncope, dizziness, chest pain, shortness of breath, weight loss.  She was placed to follow-up with a primary care doctor but has not done so.  She states that she ran out of her metoprolol and her methimazole.  Patient states that she will need a refill until she can see her primary care doctor this week.  Will provide a 2-week refill.  Encourage patient to follow-up with PCP.  Have given her very strict return precautions.  Pt is hemodynamically stable, in NAD, & able to ambulate in the ED. Evaluation does not show pathology that would require ongoing emergent intervention or inpatient treatment. I  explained the diagnosis to the patient. Pain has been managed & has no complaints prior to dc. Pt is comfortable with above plan and is stable for discharge at this time. All questions were answered prior to disposition. Strict return precautions for f/u to the ED were discussed. Encouraged follow up with PCP.   Final Clinical Impressions(s) / ED Diagnoses   Final diagnoses:  Medication refill    ED Discharge Orders        Ordered    methimazole (TAPAZOLE) 5 MG tablet  3 times daily     03/22/17 1033    metoprolol tartrate (LOPRESSOR) 25 MG tablet  2 times daily     03/22/17 1033       Rise Mu, PA-C 03/22/17 1059    Mackuen, Cindee Salt, MD 03/22/17 1535

## 2017-03-29 ENCOUNTER — Encounter: Payer: Self-pay | Admitting: Family Medicine

## 2017-03-29 ENCOUNTER — Ambulatory Visit (INDEPENDENT_AMBULATORY_CARE_PROVIDER_SITE_OTHER): Payer: Self-pay | Admitting: Family Medicine

## 2017-03-29 VITALS — BP 110/70 | HR 80 | Temp 98.7°F | Resp 14 | Ht 66.0 in | Wt 118.0 lb

## 2017-03-29 DIAGNOSIS — Z1231 Encounter for screening mammogram for malignant neoplasm of breast: Secondary | ICD-10-CM

## 2017-03-29 DIAGNOSIS — R002 Palpitations: Secondary | ICD-10-CM

## 2017-03-29 DIAGNOSIS — Z1239 Encounter for other screening for malignant neoplasm of breast: Secondary | ICD-10-CM

## 2017-03-29 DIAGNOSIS — Z131 Encounter for screening for diabetes mellitus: Secondary | ICD-10-CM

## 2017-03-29 DIAGNOSIS — E876 Hypokalemia: Secondary | ICD-10-CM

## 2017-03-29 DIAGNOSIS — E0590 Thyrotoxicosis, unspecified without thyrotoxic crisis or storm: Secondary | ICD-10-CM

## 2017-03-29 LAB — POCT URINALYSIS DIP (DEVICE)
Bilirubin Urine: NEGATIVE
GLUCOSE, UA: NEGATIVE mg/dL
Ketones, ur: NEGATIVE mg/dL
Leukocytes, UA: NEGATIVE
Nitrite: NEGATIVE
PH: 5.5 (ref 5.0–8.0)
PROTEIN: NEGATIVE mg/dL
UROBILINOGEN UA: 0.2 mg/dL (ref 0.0–1.0)

## 2017-03-29 LAB — POCT GLYCOSYLATED HEMOGLOBIN (HGB A1C): HEMOGLOBIN A1C: 5.2

## 2017-03-29 NOTE — Patient Instructions (Addendum)
Stop taking Tapazole for the next 3 days in order to evaluate if this medication is the source of your rash.  Follow-up with me by phone on Friday anytime after 2 PM to advise if your rash has completely cleared or worsened.  I would like to order a ultrasound of your thyroid gland to evaluate for nodules.  We will get this scheduled for you and contact you to let you know when the appointment will be.  I have placed an order for a breast mammogram, please call [facility]-Phone: [phone]  You will be notified of labs once they result.     Hyperthyroidism Hyperthyroidism is when the thyroid is too active (overactive). Your thyroid is a large gland that is located in your neck. The thyroid helps to control how your body uses food (metabolism). When your thyroid is overactive, it produces too much of a hormone called thyroxine. What are the causes? Causes of hyperthyroidism may include:  Graves disease. This is when your immune system attacks the thyroid gland. This is the most common cause.  Inflammation of the thyroid gland.  Tumor in the thyroid gland or somewhere else.  Excessive use of thyroid medicines, including: ? Prescription thyroid supplement. ? Herbal supplements that mimic thyroid hormones.  Solid or fluid-filled lumps within your thyroid gland (thyroid nodules).  Excessive ingestion of iodine.  What increases the risk?  Being female.  Having a family history of thyroid conditions. What are the signs or symptoms? Signs and symptoms of hyperthyroidism may include:  Nervousness.  Inability to tolerate heat.  Unexplained weight loss.  Diarrhea.  Change in the texture of hair or skin.  Heart skipping beats or making extra beats.  Rapid heart rate.  Loss of menstruation.  Shaky hands.  Fatigue.  Restlessness.  Increased appetite.  Sleep problems.  Enlarged thyroid gland or nodules.  How is this diagnosed? Diagnosis of  hyperthyroidism may include:  Medical history and physical exam.  Blood tests.  Ultrasound tests.  How is this treated? Treatment may include:  Medicines to control your thyroid.  Surgery to remove your thyroid.  Radiation therapy.  Follow these instructions at home:  Take medicines only as directed by your health care provider.  Do not use any tobacco products, including cigarettes, chewing tobacco, or electronic cigarettes. If you need help quitting, ask your health care provider.  Do not exercise or do physical activity until your health care provider approves.  Keep all follow-up appointments as directed by your health care provider. This is important. Contact a health care provider if:  Your symptoms do not get better with treatment.  You have fever.  You are taking thyroid replacement medicine and you: ? Have depression. ? Feel mentally and physically slow. ? Have weight gain. Get help right away if:  You have decreased alertness or a change in your awareness.  You have abdominal pain.  You feel dizzy.  You have a rapid heartbeat.  You have an irregular heartbeat. This information is not intended to replace advice given to you by your health care provider. Make sure you discuss any questions you have with your health care provider. Document Released: 02/21/2005 Document Revised: 07/23/2015 Document Reviewed: 07/09/2013 Elsevier Interactive Patient Education  2018 [employer]

## 2017-03-29 NOTE — Progress Notes (Signed)
Patient ID: Jennifer Stanton, female    DOB: 1960-12-18, 57 y.o.   MRN: [id number]  PCP: Bing Neighbors, FNP  Chief Complaint  Patient presents with  . Establish Care  . Hospitalization Follow-up    Subjective:  HPI Jennifer Stanton is a 57 y.o. female , with a recent diagnosis of hyperthyroidism, chronic smoker, presents for evaluation to establish care and to follow-up on recent diagnosis of hyperthyroidism. Patient was recently seen at [facility] ED and was diagnosed with hyperthyroidism and prescribed metoprolol and methimazole. This was a new diagnosis and patient has not had any routine medical evaluation with a PCP in several years.  Prior to presenting to ED she was symptomatic with ongoing palpitations, accelerated heart beat,  and rapid unintentional weight loss.  She was originally prescribed methimazole 02/14/2017 at [facility] med center ED and was advised to establish and follow-up with the primary care provider.  She was unable to obtain any additional refills and begin to take half doses of her medication prior to re-presenting to the [facility] on 03/22/2017 requesting medication refills.  She reports today since starting methimazole she has developed a rash on bilateral arms and legs. She went about 1.5 week wiithout medication and did not have a rash.  She has tolerated metoprolol without any complications.  She denies any known family history of thyroid disease, diabetes, cancer.  Mother and father are both deceased and lived over the age of 12.  She denies shortness of breath, chest pain, dizziness, headaches, or new weakness. Social History   Socioeconomic History  . Marital status: Divorced    Spouse name: Not on file  . Number of children: Not on file  . Years of education: Not on file  . Highest education level: Not on file  Social Needs  . Financial resource strain: Not on file  . Food insecurity - worry: Not on file  . Food insecurity - inability:  Not on file  . Transportation needs - medical: Not on file  . Transportation needs - non-medical: Not on file  Occupational History  . Not on file  Tobacco Use  . Smoking status: Current Every Day Smoker    Packs/day: 0.10    Types: Cigarettes  . Smokeless tobacco: Never Used  Substance and Sexual Activity  . Alcohol use: Yes    Comment: occ  . Drug use: Yes    Types: Marijuana    Comment: today   . Sexual activity: Not on file  Other Topics Concern  . Not on file  Social History Narrative  . Not on file    Family History  Problem Relation Age of Onset  . Cancer Mother   . COPD Mother   . Heart disease Father   . Hemochromatosis Brother    Review of Systems Constitutional: Negative for fever, chills, diaphoresis, activity change, positive for decreased appetite and unintentional weight loss.   Eyes: Negative for pain, discharge, redness, itching and visual disturbance. Respiratory: Negative for cough, choking, chest tightness, shortness of breath, wheezing and stridor.  Cardiovascular: Positive for  palpitations  Gastrointestinal: Negative for abdominal distention. Musculoskeletal: Negative for back pain, joint swelling, arthralgia and gait problem. Neurological: Negative for dizziness, tremors, seizures, syncope, facial asymmetry, speech difficulty, weakness, light-headedness, numbness and headaches.  Hematological: Negative for adenopathy. Does not bruise/bleed easily. Psychiatric/Behavioral: Negative for hallucinations, behavioral problems, confusion, dysphoric mood, decreased concentration and agitation.   Patient Active Problem List   Diagnosis  Date Noted  . History of seizure 06/20/2014  . Blurry vision 06/20/2014  . Cephalalgia 06/20/2014    Allergies  Allergen Reactions  . Codeine Rash    Prior to Admission medications   Medication Sig Start Date End Date Taking? Authorizing Provider  ibuprofen (ADVIL,MOTRIN) 800 MG tablet Take 1 tablet (800 mg total) by  mouth 3 (three) times daily. 05/05/15  Yes Mackuen, Courteney Lyn, MD  methimazole (TAPAZOLE) 5 MG tablet Take 1 tablet (5 mg total) by mouth 3 (three) times daily. 03/22/17  Yes Demetrios LollLeaphart, Kenneth T, PA-C  metoprolol tartrate (LOPRESSOR) 25 MG tablet Take 1 tablet (25 mg total) by mouth 2 (two) times daily. 03/22/17  Yes Leaphart, Lynann BeaverKenneth T, PA-C  cyclobenzaprine (FLEXERIL) 10 MG tablet Take 1 tablet (10 mg total) by mouth 2 (two) times daily as needed for muscle spasms. Patient not taking: Reported on 03/29/2017 05/05/15   Abelino DerrickMackuen, Courteney Lyn, MD    Past Medical, Surgical Family and Social History reviewed and updated.    Objective:   Today's Vitals   03/29/17 1306  BP: 110/70  Pulse: 80  Resp: 14  Temp: 98.7 F (37.1 C)  TempSrc: Oral  SpO2: 98%  Weight: 118 lb (53.5 kg)  Height: 5\' 6"  (1.676 m)    Wt Readings from Last 3 Encounters:  03/29/17 118 lb (53.5 kg)  03/22/17 125 lb (56.7 kg)  02/09/17 125 lb (56.7 kg)    Physical Exam Physical Exam: Constitutional: Patient appears well-developed and well-nourished. No distress. HENT: Normocephalic, atraumatic, External right and left ear normal. Oropharynx is clear and moist.  Eyes: Conjunctivae and EOM are normal. PERRLA, no scleral icterus. Neck: Normal ROM. Neck supple. No JVD. No tracheal deviation. No thyromegaly. CVS: RRR, S1/S2 +, no murmurs, no gallops, no carotid bruit.  Pulmonary: Effort and breath sounds normal, no stridor, rhonchi, wheezes, rales.  Abdominal: Soft. BS +, no distension, tenderness, rebound or guarding.  Musculoskeletal: Normal range of motion. No edema and no tenderness.  Lymphadenopathy: No lymphadenopathy noted, cervical, inguinal or axillary Neuro: Alert. Normal reflexes, muscle tone coordination. No cranial nerve deficit. Skin: Skin is warm and dry. No rash noted. Not diaphoretic. No erythema. No pallor. Psychiatric: Anxious mood and flat affect. Behavior, judgment, thought content normal.    Assessment & Plan:  1. Hyperthyroidism, prior labs taken in the ED indicated suppressed TSH level however a complete thyroid panel was not completed.  Will repeat TSH with thyroid panel today and include a CMP as patient is spirits and palpitations.  She feels that methimazole is causing a rash.  Will have her DC for the next 48 hours to see if rash resolves or improves.  If this is the case I will have her discontinue methimazole and will trial her on PTU.  Patient has no insurance or financial source to be referred at this time to endocrinology.  Encouraging her to complete the [facility] financial assistance application so that she can be further evaluated by endocrinologist.  2. Palpitations, if cause is not identified with the labs collected today will refer to cardiology.  EKG done on 03/22/2014 was significant for sinus tachycardia with left atrial hypertrophy.  3. Hypokalemia, previously hypokalemic during ED visit -will check through CMP today.  4. Screening for diabetes mellitus-(Hb A1C) 5.2 which is normal repeat in 12 months.  5. Screening breast examination no prior breast cancer screening.  Patient given information about the free breast screening program.  Encouraged to call and schedule an appointment to  have a mammogram performed. - MM Digital Diagnostic Bilat; Future   Orders Placed This Encounter  Procedures  . MM Digital Diagnostic Bilat  . Thyroid Panel With TSH  . Comprehensive metabolic panel  . CBC with Differential  . POCT glycosylated hemoglobin (Hb A1C)  . POCT urinalysis dip (device)    Meds ordered this encounter  Medications  . propylthiouracil (PTU) 50 MG tablet    Sig: Take 2 tablets (100 mg total) by mouth 3 (three) times daily.    Dispense:  90 tablet    Refill:  1    Order Specific Question:   Supervising Provider    Answer:   Quentin Angst [id number]   Return for follow-up in 4 weeks to reevaluate palpitations and  hyperthyroidism.   Godfrey Pick. Tiburcio Pea, MSN, FNP-C [facility]  [address] [place], [place] [address] [phone]

## 2017-03-30 LAB — COMPREHENSIVE METABOLIC PANEL
ALT: 19 IU/L (ref 0–32)
AST: 24 IU/L (ref 0–40)
Albumin/Globulin Ratio: 1.4 (ref 1.2–2.2)
Albumin: 4.1 g/dL (ref 3.5–5.5)
Alkaline Phosphatase: 103 IU/L (ref 39–117)
BUN/Creatinine Ratio: 17 (ref 9–23)
BUN: 9 mg/dL (ref 6–24)
Bilirubin Total: 0.2 mg/dL (ref 0.0–1.2)
CALCIUM: 9.4 mg/dL (ref 8.7–10.2)
CO2: 24 mmol/L (ref 20–29)
CREATININE: 0.54 mg/dL — AB (ref 0.57–1.00)
Chloride: 102 mmol/L (ref 96–106)
GFR calc Af Amer: 124 mL/min/{1.73_m2} (ref >59)
GFR, EST NON AFRICAN AMERICAN: 107 mL/min/{1.73_m2} (ref >59)
GLOBULIN, TOTAL: 3 g/dL (ref 1.5–4.5)
GLUCOSE: 110 mg/dL — AB (ref 65–99)
Potassium: 4.3 mmol/L (ref 3.5–5.2)
Sodium: 142 mmol/L (ref 134–144)
Total Protein: 7.1 g/dL (ref 6.0–8.5)

## 2017-03-30 LAB — CBC WITH DIFFERENTIAL/PLATELET
BASOS: 1 %
Basophils Absolute: 0 10*3/uL (ref 0.0–0.2)
EOS (ABSOLUTE): 0 10*3/uL (ref 0.0–0.4)
Eos: 1 %
HEMATOCRIT: 40.9 % (ref 34.0–46.6)
HEMOGLOBIN: 14.2 g/dL (ref 11.1–15.9)
IMMATURE GRANS (ABS): 0 10*3/uL (ref 0.0–0.1)
IMMATURE GRANULOCYTES: 0 %
LYMPHS: 53 %
Lymphocytes Absolute: 1.9 10*3/uL (ref 0.7–3.1)
MCH: 27.9 pg (ref 26.6–33.0)
MCHC: 34.7 g/dL (ref 31.5–35.7)
MCV: 80 fL (ref 79–97)
MONOCYTES: 20 %
Monocytes Absolute: 0.7 10*3/uL (ref 0.1–0.9)
NEUTROS PCT: 25 %
Neutrophils Absolute: 0.9 10*3/uL — ABNORMAL LOW (ref 1.4–7.0)
Platelets: 246 10*3/uL (ref 150–379)
RBC: 5.09 x10E6/uL (ref 3.77–5.28)
RDW: 14 % (ref 12.3–15.4)
WBC: 3.6 10*3/uL (ref 3.4–10.8)

## 2017-03-30 LAB — THYROID PANEL WITH TSH
Free Thyroxine Index: 5.3 — ABNORMAL HIGH (ref 1.2–4.9)
T3 UPTAKE RATIO: 40 % — AB (ref 24–39)
T4, Total: 13.2 ug/dL — ABNORMAL HIGH (ref 4.5–12.0)
TSH: <0.006 u[IU]/mL — ABNORMAL LOW (ref 0.450–4.500)

## 2017-04-02 ENCOUNTER — Telehealth: Payer: Self-pay | Admitting: Family Medicine

## 2017-04-02 DIAGNOSIS — E0590 Thyrotoxicosis, unspecified without thyrotoxic crisis or storm: Secondary | ICD-10-CM

## 2017-04-02 NOTE — Telephone Encounter (Signed)
Left patient voicemail to call me regarding thyroid labs and medication reaction to methimazole. If she is unable to tolerate methimazole will discontinue and start PTU 100 mg TID.  Patient will also need a ultrasound of the neck to evaluation for thyroid nodules.  Godfrey PickKimberly S. Tiburcio PeaHarris, MSN, FNP-C [facility]  [address] [place], [place] [address] [phone]

## 2017-04-03 ENCOUNTER — Ambulatory Visit: Payer: Self-pay | Admitting: Family Medicine

## 2017-04-03 NOTE — Addendum Note (Signed)
Addended by: Bing NeighborsHARRIS, Phylis Javed S on: 04/03/2017 08:38 AM   Modules accepted: Orders

## 2017-04-03 NOTE — Telephone Encounter (Signed)
Patient returned call. I have e-prescribed PTU 100 mg TID. The rash she developed since starting tapazole resolved. She is to discontinue tapazole and start PTU. Reports that she has no money available to pick-up medication. Explained if she experienced any worsening of symptoms prior to being able to start medication to go immediately to the ED as she could experience a Thyroid Storm.   Godfrey PickKimberly S. Tiburcio PeaHarris, MSN, FNP-C [facility]  [address] [place], [place] [address] [phone]

## 2017-04-06 ENCOUNTER — Telehealth: Payer: Self-pay | Admitting: Family Medicine

## 2017-04-06 ENCOUNTER — Ambulatory Visit (HOSPITAL_COMMUNITY)
Admission: RE | Admit: 2017-04-06 | Discharge: 2017-04-06 | Disposition: A | Discharge status: Home / Self Care | Payer: Self-pay | Source: Ambulatory Visit | Attending: Family Medicine | Admitting: Family Medicine

## 2017-04-06 DIAGNOSIS — E0590 Thyrotoxicosis, unspecified without thyrotoxic crisis or storm: Secondary | ICD-10-CM | POA: Insufficient documentation

## 2017-04-06 MED ORDER — PROPYLTHIOURACIL 50 MG PO TABS: 100.0000 mg | ORAL_TABLET | Freq: Three times a day (TID) | ORAL | 1 refills | Status: DC

## 2017-04-06 NOTE — Telephone Encounter (Signed)
Contact patient to advise her recent thyroid scan was negative of any nodularity.  She should start the PTU medication at her earliest convenience [facility]

## 2017-04-06 NOTE — Telephone Encounter (Signed)
Patient notified and will start medication  

## 2017-04-07 ENCOUNTER — Telehealth: Payer: Self-pay

## 2017-04-07 MED ORDER — PROPYLTHIOURACIL 50 MG PO TABS: 100.0000 mg | ORAL_TABLET | Freq: Three times a day (TID) | ORAL | 1 refills | Status: DC

## 2017-04-07 MED ORDER — METOPROLOL TARTRATE 25 MG PO TABS: 25.0000 mg | ORAL_TABLET | Freq: Two times a day (BID) | ORAL | 0 refills | Status: DC

## 2017-04-26 ENCOUNTER — Other Ambulatory Visit: Payer: Self-pay

## 2017-04-26 ENCOUNTER — Ambulatory Visit: Payer: Self-pay | Admitting: Family Medicine

## 2017-04-26 MED ORDER — METOPROLOL TARTRATE 25 MG PO TABS: 25.0000 mg | ORAL_TABLET | Freq: Two times a day (BID) | ORAL | 0 refills | Status: DC

## 2017-04-27 MED FILL — ?METOPROLOL 25 MG TABLET: 25 | 14 days supply | Qty: 28 | Fill #0

## 2017-04-27 NOTE — Telephone Encounter (Signed)
Medication refill

## 2017-04-28 ENCOUNTER — Ambulatory Visit (INDEPENDENT_AMBULATORY_CARE_PROVIDER_SITE_OTHER): Payer: Self-pay | Admitting: Family Medicine

## 2017-04-28 ENCOUNTER — Encounter: Payer: Self-pay | Admitting: Family Medicine

## 2017-04-28 VITALS — BP 114/62 | HR 98 | Temp 98.5°F | Resp 16 | Ht 66.0 in | Wt 119.0 lb

## 2017-04-28 DIAGNOSIS — R0602 Shortness of breath: Secondary | ICD-10-CM

## 2017-04-28 DIAGNOSIS — Z23 Encounter for immunization: Secondary | ICD-10-CM

## 2017-04-28 DIAGNOSIS — J449 Chronic obstructive pulmonary disease, unspecified: Secondary | ICD-10-CM

## 2017-04-28 DIAGNOSIS — E0590 Thyrotoxicosis, unspecified without thyrotoxic crisis or storm: Secondary | ICD-10-CM

## 2017-04-28 DIAGNOSIS — R002 Palpitations: Secondary | ICD-10-CM

## 2017-04-28 DIAGNOSIS — I517 Cardiomegaly: Secondary | ICD-10-CM

## 2017-04-28 MED ORDER — METOPROLOL TARTRATE 25 MG PO TABS: ORAL_TABLET | ORAL | 3 refills | Status: DC

## 2017-04-28 MED ORDER — FLUTICASONE PROPIONATE (INHAL) 50 MCG/BLIST IN AEPB: 1.0000 | INHALATION_SPRAY | Freq: Two times a day (BID) | RESPIRATORY_TRACT | 12 refills | Status: DC

## 2017-04-28 MED ORDER — FLUTICASONE FUROATE-VILANTEROL 100-25 MCG/INH IN AEPB: 1.0000 | INHALATION_SPRAY | Freq: Every day | RESPIRATORY_TRACT | 1 refills | Status: DC

## 2017-04-28 MED ORDER — ALBUTEROL SULFATE HFA 108 (90 BASE) MCG/ACT IN AERS: 2.0000 | INHALATION_SPRAY | RESPIRATORY_TRACT | 1 refills | Status: DC | PRN

## 2017-04-28 NOTE — Progress Notes (Signed)
Patient ID: Jennifer Stanton, female    DOB: 01/16/1961, 57 y.o.   MRN: [id number]  PCP: Bing NeighborsHarris, Aylene Acoff S, FNP  Chief Complaint  Patient presents with  . Follow-up    4 weeks for palpitations    Subjective:  HPI-Jennifer Stanton is a 57 y.o. female with hyperthyroidism,  COPD presents for evaluation of medication therapy. Jennifer Stanton was recently diagnosed with hyperthyroidism and started on tapazole. Medication was caused hives and therefore she was prescribed PTU. She reports compliance with both her PTU and metoprolol. In spite of compliance with medication she continues toexperience palpitations daily. She is not checking her pulse however reports feeling a sensation of palpitations almost persistently. This is interfering with rest and precipitating insomnia. Last EKG was significant for sinus tachycardia and left atrial enlargement Patient also complains of shortness of breath at rest and with exertion. X-ray taken in the ED in December 2018 was significant for COPD. Patient is a chronic every day heavy smoker. She denies chronic cough. Has never been treated or evaluated for COPD.   Social History   Socioeconomic History  . Marital status: Divorced    Spouse name: Not on file  . Number of children: Not on file  . Years of education: Not on file  . Highest education level: Not on file  Social Needs  . Financial resource strain: Not on file  . Food insecurity - worry: Not on file  . Food insecurity - inability: Not on file  . Transportation needs - medical: Not on file  . Transportation needs - non-medical: Not on file  Occupational History  . Not on file  Tobacco Use  . Smoking status: Current Every Day Smoker    Packs/day: 0.10    Types: Cigarettes  . Smokeless tobacco: Never Used  Substance and Sexual Activity  . Alcohol use: Yes    Comment: occ  . Drug use: Yes    Types: Marijuana    Comment: today   . Sexual activity: Not on file  Other Topics Concern  . Not on file   Social History Narrative  . Not on file    Family History  Problem Relation Age of Onset  . Cancer Mother   . COPD Mother   . Heart disease Father   . Hemochromatosis Brother    Review of Systems Pertinent negatives listed in HPI Patient Active Problem List   Diagnosis Date Noted  . History of seizure 06/20/2014  . Blurry vision 06/20/2014  . Cephalalgia 06/20/2014    Allergies  Allergen Reactions  . Codeine Rash    Prior to Admission medications   Medication Sig Start Date End Date Taking? Authorizing Provider  metoprolol tartrate (LOPRESSOR) 25 MG tablet Take 1 tablet (25 mg total) by mouth 2 (two) times daily. 04/26/17  Yes Bing NeighborsHarris, Esdras Delair S, FNP  propylthiouracil (PTU) 50 MG tablet Take 2 tablets (100 mg total) by mouth 3 (three) times daily. 04/07/17  Yes Bing NeighborsHarris, Georgana Romain S, FNP    Past Medical, Surgical Family and Social History reviewed and updated.    Objective:   Today's Vitals   04/28/17 1319  BP: 114/62  Pulse: 98  Resp: 16  Temp: 98.5 F (36.9 C)  TempSrc: Oral  SpO2: 99%  Weight: 119 lb (54 kg)  Height: 5\' 6"  (1.676 m)    Wt Readings from Last 3 Encounters:  04/28/17 119 lb (54 kg)  03/29/17 118 lb (53.5 kg)  03/22/17 125 lb (56.7 kg)   Physical  Exam  Constitutional: She is oriented to person, place, and time. She appears well-developed and well-nourished.  HENT:  Head: Normocephalic and atraumatic.  Eyes: Conjunctivae are normal. Pupils are equal, round, and reactive to light.  Neck: Normal range of motion. Neck supple.  Cardiovascular: Normal rate, regular rhythm, normal heart sounds and intact distal pulses.  Pulmonary/Chest: Effort normal. She has no wheezes. She has rhonchi.  Lymphadenopathy:    She has no cervical adenopathy.  Neurological: She is alert and oriented to person, place, and time.  Skin: Skin is warm and dry.  Psychiatric: She has a normal mood and affect. Her behavior is normal. Judgment and thought content normal.    Assessment & Plan:  1. Hyperthyroidism, continue PTU 100 mg TID for now. Will repeat  Thyroid Panel With TSH. If non-therapeutic will increase dose to 400 mg daily.  2. Chronic obstructive pulmonary disease, unspecified COPD type (HCC) 3. Shortness of breath,  Given patient history of chronic heavy everyday smoking, and recent chest imaging significant for pulmonary hyperinflation, will trial COPD treatment regimen with LABA and SABA. Encouraged smoking cessation-patient is in the "not ready" stage.  4. Palpitation, likely secondary to hyperthyroidism. Repeating TSH today to ensure therapeutic level. Increased metoprolol 50 mg in the morning and 25 mg at bedtime to stabilize heart rate and palpitation. Will refer to cardiology for second opinion as EKG was significant for left atrial enlargement.  5. Need for immunization against influenza- Flu Vaccine QUAD 36+ mos IM    RTC: 3 months for chronic condition management.   Godfrey Pick. Tiburcio Pea, MSN, FNP-C [facility]  [address] [place], [place] [address] [phone]

## 2017-04-28 NOTE — Patient Instructions (Addendum)
Please take the albuterol inhalers as needed for your shortness of breath. Please take your maintenance inhaler breo as ordered daily. Expect a call from cardiology to follow up for palpitations and shortness off breath. Please call patient accounting at [phone] to follow up on your financial assistance packet.   Chronic Obstructive Pulmonary Disease   Chronic obstructive pulmonary disease (COPD) is a long-term (chronic) lung problem. When you have COPD, it is hard for air to get in and out of your lungs. The way your lungs work will never return to normal. Usually the condition gets worse over time. There are things you can do to keep yourself as healthy as possible. Your doctor may treat your condition with:  Medicines.  Quitting smoking, if you smoke.  Rehabilitation. This may involve a team of specialists.  Oxygen.  Exercise and changes to your diet.  Lung surgery.  Comfort measures (palliative care).  Follow these instructions at home: Medicines  Take over-the-counter and prescription medicines only as told by your doctor.  Talk to your doctor before taking any cough or allergy medicines. You may need to avoid medicines that cause your lungs to be dry. Lifestyle  If you smoke, stop. Smoking makes the problem worse. If you need help quitting, ask your doctor.  Avoid being around things that make your breathing worse. This may include smoke, chemicals, and fumes.  Stay active, but remember to also rest.  Learn and use tips on how to relax.  Make sure you get enough sleep. Most adults need at least 7 hours a night.  Eat healthy foods. Eat smaller meals more often. Rest before meals. Controlled breathing  Learn and use tips on how to control your breathing as told by your doctor. Try: ? Breathing in (inhaling) through your nose for 1 second. Then, pucker your lips and breath out (exhale) through your lips for 2 seconds. ? Putting one hand on your belly (abdomen).  Breathe in slowly through your nose for 1 second. Your hand on your belly should move out. Pucker your lips and breathe out slowly through your lips. Your hand on your belly should move in as you breathe out. Controlled coughing  Learn and use controlled coughing to clear mucus from your lungs. The steps are: 1. Lean your head a little forward. 2. Breathe in deeply. 3. Try to hold your breath for 3 seconds. 4. Keep your mouth slightly open while coughing 2 times. 5. Spit any mucus out into a tissue. 6. Rest and do the steps again 1 or 2 times as needed. General instructions  Make sure you get all the shots (vaccines) that your doctor recommends. Ask your doctor about a flu shot and a pneumonia shot.  Use oxygen therapy and therapy to help improve your lungs (pulmonary rehabilitation) if told by your doctor. If you need home oxygen therapy, ask your doctor if you should buy a tool to measure your oxygen level (oximeter).  Make a COPD action plan with your doctor. This helps you know what to do if you feel worse than usual.  Manage any other conditions you have as told by your doctor.  Avoid going outside when it is very hot, cold, or humid.  Avoid people who have a sickness you can catch (contagious).  Keep all follow-up visits as told by your doctor. This is important. Contact a doctor if:  You cough up more mucus than usual.  There is a change in the color or thickness of the mucus.  It is harder to breathe than usual.  Your breathing is faster than usual.  You have trouble sleeping.  You need to use your medicines more often than usual.  You have trouble doing your normal activities such as getting dressed or walking around the house. Get help right away if:  You have shortness of breath while resting.  You have shortness of breath that stops you from: ? Being able to talk. ? Doing normal activities.  Your chest hurts for longer than 5 minutes.  Your skin color is  more blue than usual.  Your pulse oximeter shows that you have low oxygen for longer than 5 minutes.  You have a fever.  You feel too tired to breathe normally. Summary  Chronic obstructive pulmonary disease (COPD) is a long-term lung problem.  The way your lungs work will never return to normal. Usually the condition gets worse over time. There are things you can do to keep yourself as healthy as possible.  Take over-the-counter and prescription medicines only as told by your doctor.  If you smoke, stop. Smoking makes the problem worse. This information is not intended to replace advice given to you by your health care provider. Make sure you discuss any questions you have with your health care provider. Document Released: 08/10/2007 Document Revised: 07/30/2015 Document Reviewed: 10/18/2012 Elsevier Interactive Patient Education  2017 Elsevier Inc. Hyperthyroidism Hyperthyroidism is when the thyroid is too active (overactive). Your thyroid is a large gland that is located in your neck. The thyroid helps to control how your body uses food (metabolism). When your thyroid is overactive, it produces too much of a hormone called thyroxine. What are the causes? Causes of hyperthyroidism may include:  Graves disease. This is when your immune system attacks the thyroid gland. This is the most common cause.  Inflammation of the thyroid gland.  Tumor in the thyroid gland or somewhere else.  Excessive use of thyroid medicines, including: ? Prescription thyroid supplement. ? Herbal supplements that mimic thyroid hormones.  Solid or fluid-filled lumps within your thyroid gland (thyroid nodules).  Excessive ingestion of iodine.  What increases the risk?  Being female.  Having a family history of thyroid conditions. What are the signs or symptoms? Signs and symptoms of hyperthyroidism may include:  Nervousness.  Inability to tolerate heat.  Unexplained weight  loss.  Diarrhea.  Change in the texture of hair or skin.  Heart skipping beats or making extra beats.  Rapid heart rate.  Loss of menstruation.  Shaky hands.  Fatigue.  Restlessness.  Increased appetite.  Sleep problems.  Enlarged thyroid gland or nodules.  How is this diagnosed? Diagnosis of hyperthyroidism may include:  Medical history and physical exam.  Blood tests.  Ultrasound tests.  How is this treated? Treatment may include:  Medicines to control your thyroid.  Surgery to remove your thyroid.  Radiation therapy.  Follow these instructions at home:  Take medicines only as directed by your health care provider.  Do not use any tobacco products, including cigarettes, chewing tobacco, or electronic cigarettes. If you need help quitting, ask your health care provider.  Do not exercise or do physical activity until your health care provider approves.  Keep all follow-up appointments as directed by your health care provider. This is important. Contact a health care provider if:  Your symptoms do not get better with treatment.  You have fever.  You are taking thyroid replacement medicine and you: ? Have depression. ? Feel mentally and physically slow. ?  Have weight gain. Get help right away if:  You have decreased alertness or a change in your awareness.  You have abdominal pain.  You feel dizzy.  You have a rapid heartbeat.  You have an irregular heartbeat. This information is not intended to replace advice given to you by your health care provider. Make sure you discuss any questions you have with your health care provider. Document Released: 02/21/2005 Document Revised: 07/23/2015 Document Reviewed: 07/09/2013 Elsevier Interactive Patient Education  2018 [employer]

## 2017-04-29 LAB — THYROID PANEL WITH TSH
FREE THYROXINE INDEX: 2.9 (ref 1.2–4.9)
T3 UPTAKE RATIO: 32 % (ref 24–39)
T4, Total: 9.1 ug/dL (ref 4.5–12.0)

## 2017-05-03 ENCOUNTER — Other Ambulatory Visit: Payer: Self-pay | Admitting: Obstetrics and Gynecology

## 2017-05-03 DIAGNOSIS — Z1231 Encounter for screening mammogram for malignant neoplasm of breast: Secondary | ICD-10-CM

## 2017-05-08 ENCOUNTER — Telehealth: Payer: Self-pay | Admitting: Family Medicine

## 2017-05-08 MED ORDER — PROPYLTHIOURACIL 50 MG PO TABS: 200.0000 mg | ORAL_TABLET | Freq: Two times a day (BID) | ORAL | 1 refills | Status: DC

## 2017-05-08 NOTE — Telephone Encounter (Signed)
Left a vm for patient to callback 

## 2017-05-08 NOTE — Telephone Encounter (Signed)
Patient notified and will callback to schedule lab recheck

## 2017-05-08 NOTE — Telephone Encounter (Signed)
Contact patient to advise her thyroid level continues to remain nontherapeutic.  I am increasing her dose of PTU 200 mg, 2 times daily. I would like for her to return in 6 weeks for a repeat thyroid panel.

## 2017-05-17 ENCOUNTER — Telehealth: Payer: Self-pay

## 2017-05-17 MED ORDER — FLUTICASONE FUROATE-VILANTEROL 100-25 MCG/INH IN AEPB: 1.0000 | INHALATION_SPRAY | Freq: Every day | RESPIRATORY_TRACT | 1 refills | Status: DC

## 2017-05-17 MED ORDER — ALBUTEROL SULFATE HFA 108 (90 BASE) MCG/ACT IN AERS: 2.0000 | INHALATION_SPRAY | RESPIRATORY_TRACT | 1 refills | Status: DC | PRN

## 2017-05-17 MED ORDER — METOPROLOL TARTRATE 25 MG PO TABS: ORAL_TABLET | ORAL | 3 refills | Status: DC

## 2017-05-17 MED ORDER — PROPYLTHIOURACIL 50 MG PO TABS: 200.0000 mg | ORAL_TABLET | Freq: Two times a day (BID) | ORAL | 1 refills | Status: DC

## 2017-05-17 NOTE — Telephone Encounter (Signed)
Medication has been sent to pharmacy.  °

## 2017-05-19 ENCOUNTER — Telehealth: Payer: Self-pay

## 2017-05-19 MED ORDER — FLUTICASONE FUROATE-VILANTEROL 100-25 MCG/INH IN AEPB: 1.0000 | INHALATION_SPRAY | Freq: Every day | RESPIRATORY_TRACT | 1 refills | Status: DC

## 2017-05-19 MED ORDER — METOPROLOL TARTRATE 25 MG PO TABS: ORAL_TABLET | ORAL | 3 refills | Status: DC

## 2017-05-19 MED ORDER — ALBUTEROL SULFATE HFA 108 (90 BASE) MCG/ACT IN AERS: 2.0000 | INHALATION_SPRAY | RESPIRATORY_TRACT | 1 refills | Status: DC | PRN

## 2017-05-19 MED ORDER — PROPYLTHIOURACIL 50 MG PO TABS: 200.0000 mg | ORAL_TABLET | Freq: Two times a day (BID) | ORAL | 1 refills | Status: DC

## 2017-05-19 MED FILL — !BREO ELLIPTA 100-25 MCG IN: 100-25 | 30 days supply | Qty: 60 | Fill #0

## 2017-05-19 MED FILL — !VENTOLIN HFA INHALER: 108 (90 BAS | 16 days supply | Qty: 18 | Fill #0

## 2017-05-19 MED FILL — METOPROLOL TARTRATE 25 MG T: 25 | 30 days supply | Qty: 90 | Fill #0

## 2017-05-19 NOTE — Telephone Encounter (Signed)
Jennifer Stanton,   Contact patient to advise all medication have been resent to [facility].    Godfrey PickKimberly S. Tiburcio PeaHarris, MSN, FNP-C [facility]  [address] [place], [place] [address] [phone]

## 2017-05-22 MED FILL — PROPYLTHIOURACIL 50 MG TABS: 50 | 30 days supply | Qty: 240 | Fill #0

## 2017-05-22 NOTE — Telephone Encounter (Signed)
Advised patient of all.  

## 2017-06-01 ENCOUNTER — Encounter (HOSPITAL_COMMUNITY): Payer: Self-pay

## 2017-06-01 ENCOUNTER — Ambulatory Visit (HOSPITAL_COMMUNITY)
Admission: RE | Admit: 2017-06-01 | Discharge: 2017-06-01 | Disposition: A | Discharge status: Home / Self Care | Payer: Self-pay | Source: Ambulatory Visit | Attending: Obstetrics and Gynecology | Admitting: Obstetrics and Gynecology

## 2017-06-01 ENCOUNTER — Ambulatory Visit
Admission: RE | Admit: 2017-06-01 | Discharge: 2017-06-01 | Disposition: A | Discharge status: Home / Self Care | Payer: No Typology Code available for payment source | Source: Ambulatory Visit | Attending: Obstetrics and Gynecology | Admitting: Obstetrics and Gynecology

## 2017-06-01 VITALS — BP 118/74 | Ht 67.0 in | Wt 111.4 lb

## 2017-06-01 DIAGNOSIS — Z01419 Encounter for gynecological examination (general) (routine) without abnormal findings: Secondary | ICD-10-CM

## 2017-06-01 DIAGNOSIS — Z1231 Encounter for screening mammogram for malignant neoplasm of breast: Secondary | ICD-10-CM

## 2017-06-01 NOTE — Progress Notes (Signed)
No complaints today.   Pap Smear: Pap smear completed today. Last Pap smear was around 15 years ago and normal per patient. Per patient has a history of two abnormal Pap smears one in the early 1980's and the other around 1989. Patient stated she had cryotherapy to follow-up for one of them and a LEEP to follow-up for the other one. No Pap smear results are in Epic.  Physical exam: Breasts Breasts symmetrical. No skin abnormalities bilateral breasts. No nipple retraction bilateral breasts. No nipple discharge bilateral breasts. No lymphadenopathy. No lumps palpated bilateral breasts. No complaints of pain or tenderness on exam. [facility]. Appointment scheduled for Thursday, June 01, 2017 at 1610.        Pelvic/Bimanual   Ext Genitalia No lesions, no swelling and no discharge observed on external genitalia.         Vagina Vagina pink and normal texture. No lesions or discharge observed in vagina.          Cervix Cervix is present. Cervix pink and of normal texture. No discharge observed.     Uterus Uterus is present and palpable. Uterus in normal position and normal size.        Adnexae Bilateral ovaries present and palpable. No tenderness on palpation.         Rectovaginal No rectal exam completed today since patient had no rectal complaints. No skin abnormalities observed on exam.    Smoking History: Patient has never smoked.  Patient Navigation: Patient education provided. Access to services provided for patient through [facility] program.   Colorectal Cancer Screening: Per patient has never had a colonoscopy completed. No complaints today. FIT Test given to patient to complete and return to [facility].  Breast and Cervical Cancer Risk Assessment: Patient has a family history of her mother having breast cancer. Patient has no known genetic mutations or history of radiation treatment to the chest before age 57. Per  patient has a history of cervical dysplasia. Patient has no history of being immunocompromised or DES exposure in-utero.

## 2017-06-01 NOTE — Patient Instructions (Signed)
Explained breast self awareness with Jennifer Stanton. Let patient know that if today's Pap smear is normal that her next Pap smear will be due in one year due to her history of abnormal Pap smears and patient thinks she has only had one normal Pap smear since last abnormal. [facility]. Appointment scheduled for Thursday, June 01, 2017 at 1610. Let patient know will follow up with her within the next couple weeks with results of the Pap smear by the letter or phone. Informed patient that the Breast Center will follow-up with her within the next couple of weeks with results of mammogram by letter or phone. Jennifer Stanton verbalized understanding.  Cherina Dhillon, Kathaleen Maserhristine Poll, RN 3:56 PM

## 2017-06-02 ENCOUNTER — Other Ambulatory Visit: Payer: Self-pay

## 2017-06-02 ENCOUNTER — Encounter (HOSPITAL_COMMUNITY): Payer: Self-pay | Admitting: *Deleted

## 2017-06-02 LAB — CYTOLOGY - PAP
Diagnosis: NEGATIVE
HPV (WINDOPATH): DETECTED — AB

## 2017-06-06 ENCOUNTER — Ambulatory Visit (INDEPENDENT_AMBULATORY_CARE_PROVIDER_SITE_OTHER): Payer: Self-pay | Admitting: Family Medicine

## 2017-06-06 ENCOUNTER — Encounter: Payer: Self-pay | Admitting: Family Medicine

## 2017-06-06 VITALS — BP 108/68 | HR 80 | Temp 98.1°F | Ht 67.0 in | Wt 112.6 lb

## 2017-06-06 DIAGNOSIS — E0590 Thyrotoxicosis, unspecified without thyrotoxic crisis or storm: Secondary | ICD-10-CM

## 2017-06-06 DIAGNOSIS — G4709 Other insomnia: Secondary | ICD-10-CM

## 2017-06-06 MED ORDER — TRAZODONE HCL 50 MG PO TABS: 50.0000 mg | ORAL_TABLET | Freq: Every evening | ORAL | 1 refills | Status: DC | PRN

## 2017-06-06 MED FILL — traZODone HCL 50 MG TABS: 50 | 30 days supply | Qty: 60 | Fill #0

## 2017-06-06 NOTE — Progress Notes (Signed)
Patient ID: Jennifer Stanton, female    DOB: [birth year], 57 y.o.   MRN: [id number]  PCP: Bing Neighbors, FNP  Chief Complaint  Patient presents with  . Insomnia    Subjective:  HPI Jennifer Stanton is a 57 y.o. female with hyperthyroidism, presents for evaluation of worsening insomnia. Reports falling asleep early and awakening around 11 pm and unable to return to sleep. She falls asleep around 7 or 8 pm and reports being to tired to stay awake longer than 8 pm. Insomnia has been an issue on-going for sometime. She feel her hyperthyroidism is better controlled as she is not experiencing any palpitations, shortness of breath, or anxiety. Reports no prior treatment for insomnia.  Social History   Socioeconomic History  . Marital status: Divorced    Spouse name: Not on file  . Number of children: Not on file  . Years of education: Not on file  . Highest education level: Not on file  Occupational History  . Not on file  Social Needs  . Financial resource strain: Not on file  . Food insecurity:    Worry: Not on file    Inability: Not on file  . Transportation needs:    Medical: Not on file    Non-medical: Not on file  Tobacco Use  . Smoking status: Current Every Day Smoker    Packs/day: 0.10    Types: Cigarettes  . Smokeless tobacco: Never Used  Substance and Sexual Activity  . Alcohol use: Yes    Comment: occ  . Drug use: Yes    Types: Marijuana    Comment: today   . Sexual activity: Yes    Birth control/protection: Post-menopausal  Lifestyle  . Physical activity:    Days per week: 2 days    Minutes per session: 50 min  . Stress: Very much  Relationships  . Social connections:    Talks on phone: More than three times a week    Gets together: Once a week    Attends religious service: Never    Active member of club or organization: No    Attends meetings of clubs or organizations: Never    Relationship status: Divorced  . Intimate partner violence:    Fear of  current or ex partner: No    Emotionally abused: No    Physically abused: No    Forced sexual activity: No  Other Topics Concern  . Not on file  Social History Narrative  . Not on file    Family History  Problem Relation Age of Onset  . Cancer Mother   . COPD Mother   . Breast cancer Mother 6  . Heart disease Father   . Hemochromatosis Brother    Review of Systems  Pertinent negatives listed in HPI  Patient Active Problem List   Diagnosis Date Noted  . History of seizure 06/20/2014  . Blurry vision 06/20/2014  . Cephalalgia 06/20/2014    Allergies  Allergen Reactions  . Codeine Rash    Prior to Admission medications   Medication Sig Start Date End Date Taking? Authorizing Provider  albuterol (PROVENTIL HFA;VENTOLIN HFA) 108 (90 Base) MCG/ACT inhaler Inhale 2 puffs into the lungs every 4 (four) hours as needed for wheezing or shortness of breath (cough, shortness of breath or wheezing.). 05/19/17  Yes Bing Neighbors, FNP  fluticasone furoate-vilanterol (BREO ELLIPTA) 100-25 MCG/INH AEPB Inhale 1 puff into the lungs daily. 05/19/17  Yes Bing Neighbors, FNP  metoprolol tartrate (  LOPRESSOR) 25 MG tablet Take 2 tablets (50 mg) once daily in AM. Take 1 tablet  (25 mg) once daily in the evening. 05/19/17  Yes Bing NeighborsHarris, Glyn Zendejas S, FNP  propylthiouracil (PTU) 50 MG tablet Take 4 tablets (200 mg total) by mouth 2 (two) times daily. 05/19/17 07/18/17 Yes Bing NeighborsHarris, Angellee Cohill S, FNP    Past Medical, Surgical Family and Social History reviewed and updated.    Objective:   Today's Vitals   06/06/17 1454  BP: 108/68  Pulse: 80  Temp: 98.1 F (36.7 C)  TempSrc: Oral  SpO2: 100%  Weight: 112 lb 9.6 oz (51.1 kg)  Height: 5\' 7"  (1.702 m)    Wt Readings from Last 3 Encounters:  06/06/17 112 lb 9.6 oz (51.1 kg)  06/01/17 111 lb 6.4 oz (50.5 kg)  04/28/17 119 lb (54 kg)   Physical Exam  Constitutional: Patient appears well-developed and well-nourished. No distress. HENT:  Normocephalic, atraumatic, External right and left ear normal. Oropharynx is clear and moist.  Eyes: Conjunctivae and EOM are normal. PERRLA, no scleral icterus. Neck: Normal ROM. Neck supple. No JVD. No tracheal deviation. No thyromegaly. CVS: RRR, S1/S2 +, no murmurs, no gallops, no carotid bruit.  Pulmonary: Effort and breath sounds normal, no stridor, rhonchi, wheezes, rales.  Abdominal: Soft. BS +, no distension, tenderness, rebound or guarding.  Musculoskeletal: Normal range of motion. No edema and no tenderness.  Lymphadenopathy: No lymphadenopathy noted, cervical, inguinal or axillary Neuro: Alert. Normal reflexes, muscle tone coordination. No cranial nerve deficit. Skin: Skin is warm and dry. No rash noted. Not diaphoretic. No erythema. No pallor. Psychiatric: Normal mood and affect. Behavior, judgment, thought content normal.     Assessment /Plan 1. Other insomnia Recommended improving sleep quality by choosing a set time for bed and achieving at least 7-8 hours of rest per night. Avoid caffeine and eating prior to bedtime. Will trial Trazodone 50 mg initially one hour prior to bedtime to induce sleepiness. May repeat dose within 2 hours if sleepiness doesn't occur.  2. Hyperthyroidism, continue medication as prescribed. Keep follow-up appointment for recheck of TSH.    Godfrey PickKimberly S. Tiburcio PeaHarris, MSN, FNP-C [facility]  [address] [place], [place] [address] [phone]

## 2017-06-15 LAB — FECAL OCCULT BLOOD, IMMUNOCHEMICAL: Fecal Occult Bld: NEGATIVE

## 2017-06-16 NOTE — Addendum Note (Signed)
Addended by: Bing NeighborsHARRIS, Kierrah Kilbride S on: 06/16/2017 03:50 PM   Modules accepted: Level of Service

## 2017-06-26 ENCOUNTER — Ambulatory Visit: Payer: Self-pay | Admitting: Cardiovascular Disease

## 2017-06-26 ENCOUNTER — Encounter (HOSPITAL_COMMUNITY): Payer: Self-pay

## 2017-06-28 MED FILL — traZODone HCL 50 MG TABS: 50 | 30 days supply | Qty: 60 | Fill #1

## 2017-06-28 MED FILL — PROPYLTHIOURACIL 50 MG TABS: 50 | 30 days supply | Qty: 240 | Fill #1

## 2017-06-28 MED FILL — METOPROLOL TARTRATE 25 MG T: 25 | 30 days supply | Qty: 90 | Fill #1

## 2017-07-03 ENCOUNTER — Other Ambulatory Visit: Payer: Self-pay

## 2017-07-03 DIAGNOSIS — E0590 Thyrotoxicosis, unspecified without thyrotoxic crisis or storm: Secondary | ICD-10-CM

## 2017-07-04 ENCOUNTER — Other Ambulatory Visit: Payer: No Typology Code available for payment source

## 2017-07-04 LAB — THYROID PANEL WITH TSH
FREE THYROXINE INDEX: 1.2 (ref 1.2–4.9)
T3 Uptake Ratio: 22 % — ABNORMAL LOW (ref 24–39)
T4, Total: 5.4 ug/dL (ref 4.5–12.0)
TSH: <0.006 u[IU]/mL — ABNORMAL LOW (ref 0.450–4.500)

## 2017-07-05 ENCOUNTER — Telehealth: Payer: Self-pay | Admitting: Family Medicine

## 2017-07-05 MED ORDER — PROPYLTHIOURACIL 50 MG PO TABS: 200.0000 mg | ORAL_TABLET | Freq: Three times a day (TID) | ORAL | 2 refills | Status: DC

## 2017-07-05 NOTE — Telephone Encounter (Signed)
Patient notified and will schedule a follow up appointment.

## 2017-07-05 NOTE — Telephone Encounter (Signed)
Contact patient to advise her thyroid level continues to remain non-therapeutic.  I am increasing her PTU to 200 mg 3 times daily for a total of 600 mg daily.  She will need to schedule a follow-up with a new provider to have her repeat thyroid panel performed within 2 months.  Patient may warrant an endocrinology referral.  She has previously been given the [facility] health financial assistance application uncertain if patient has completed form and submitted. Medication has been updated at the pharmacy

## 2017-07-05 NOTE — Telephone Encounter (Signed)
Left a vm for patient to callback 

## 2017-07-26 ENCOUNTER — Ambulatory Visit: Payer: Self-pay | Admitting: Family Medicine

## 2017-07-26 ENCOUNTER — Other Ambulatory Visit: Payer: Self-pay | Admitting: Family Medicine

## 2017-07-26 MED FILL — METOPROLOL TARTRATE 25 MG T: 25 | 30 days supply | Qty: 90 | Fill #2

## 2017-07-26 MED FILL — traZODone HCL 50 MG TABS: 50 | 30 days supply | Qty: 60 | Fill #0

## 2017-07-26 MED FILL — PROPYLTHIOURACIL 50 MG TABS: 50 | 30 days supply | Qty: 360 | Fill #0

## 2017-09-12 ENCOUNTER — Ambulatory Visit (INDEPENDENT_AMBULATORY_CARE_PROVIDER_SITE_OTHER): Payer: Self-pay | Admitting: Family Medicine

## 2017-09-12 ENCOUNTER — Encounter: Payer: Self-pay | Admitting: Family Medicine

## 2017-09-12 VITALS — BP 140/68 | HR 78 | Temp 98.4°F | Ht 67.0 in | Wt 117.0 lb

## 2017-09-12 DIAGNOSIS — E0590 Thyrotoxicosis, unspecified without thyrotoxic crisis or storm: Secondary | ICD-10-CM

## 2017-09-12 DIAGNOSIS — Z09 Encounter for follow-up examination after completed treatment for conditions other than malignant neoplasm: Secondary | ICD-10-CM

## 2017-09-12 DIAGNOSIS — G4709 Other insomnia: Secondary | ICD-10-CM

## 2017-09-12 MED FILL — traZODone HCL 50 MG TABS: 50 | 30 days supply | Qty: 60 | Fill #1

## 2017-09-12 MED FILL — PROPYLTHIOURACIL 50 MG TABS: 50 | 30 days supply | Qty: 360 | Fill #1

## 2017-09-12 MED FILL — METOPROLOL TARTRATE 25 MG T: 25 | 30 days supply | Qty: 90 | Fill #3

## 2017-09-12 NOTE — Progress Notes (Signed)
New Patient-Establish Care  Subjective:    Patient ID: Jennifer Stanton, female    DOB: 10-25-1960, 57 y.o.   MRN: [id number]   PCP: Raliegh Ip, NP  Chief Complaint  Patient presents with  . Follow-up    labs, sleep issues    HPI  Jennifer Stanton has a past medical history of Thyroid Disease, Seizures, Insomnia, Anxiety, and Blurry Vision. She is here today to establish care.   Current Status: Since her last office visit, she is doing well with no complaints.   She denies fevers, chills, fatigue, recent infections, weight loss, and night sweats.   She reports recent vision changes and occasional headaches. She has not had any dizziness, and falls.   No chest pain, heart palpitations, cough and shortness of breath reported.   No reports of GI problems such as nausea, vomiting, diarrhea, and constipation. She has no reports of blood in stools, dysuria and hematuria.   She has mild situational anxiety.  She denies pain today.   Past Medical History:  Diagnosis Date  . Blurry vision 06/20/2014  . Cephalalgia 06/20/2014  . History of seizure 06/20/2014  . Seizures (HCC)   . Thyroid disease     Family History  Problem Relation Age of Onset  . Cancer Mother   . COPD Mother   . Breast cancer Mother 58  . Heart disease Father   . Hemochromatosis Brother     Social History   Socioeconomic History  . Marital status: Divorced    Spouse name: Not on file  . Number of children: Not on file  . Years of education: Not on file  . Highest education level: Not on file  Occupational History  . Not on file  Social Needs  . Financial resource strain: Not on file  . Food insecurity:    Worry: Not on file    Inability: Not on file  . Transportation needs:    Medical: Not on file    Non-medical: Not on file  Tobacco Use  . Smoking status: Current Every Day Smoker    Packs/day: 0.10    Types: Cigarettes  . Smokeless tobacco: Never Used  Substance and Sexual Activity  . Alcohol  use: Yes    Comment: occ  . Drug use: Yes    Types: Marijuana    Comment: today   . Sexual activity: Yes    Birth control/protection: Post-menopausal  Lifestyle  . Physical activity:    Days per week: 2 days    Minutes per session: 50 min  . Stress: Very much  Relationships  . Social connections:    Talks on phone: More than three times a week    Gets together: Once a week    Attends religious service: Never    Active member of club or organization: No    Attends meetings of clubs or organizations: Never    Relationship status: Divorced  . Intimate partner violence:    Fear of current or ex partner: No    Emotionally abused: No    Physically abused: No    Forced sexual activity: No  Other Topics Concern  . Not on file  Social History Narrative  . Not on file    Past Surgical History:  Procedure Laterality Date  . INNER EAR SURGERY  at age 57  . TUBAL LIGATION  Jul 05, 1991    Immunization History  Administered Date(s) Administered  . Influenza,inj,Quad PF,6+ Mos 04/28/2017  . Tdap 01/01/2013  Current Meds  Medication Sig  . albuterol (PROVENTIL HFA;VENTOLIN HFA) 108 (90 Base) MCG/ACT inhaler Inhale 2 puffs into the lungs every 4 (four) hours as needed for wheezing or shortness of breath (cough, shortness of breath or wheezing.).  Marland Kitchen. fluticasone furoate-vilanterol (BREO ELLIPTA) 100-25 MCG/INH AEPB Inhale 1 puff into the lungs daily.  . metoprolol tartrate (LOPRESSOR) 25 MG tablet Take 2 tablets (50 mg) once daily in AM. Take 1 tablet  (25 mg) once daily in the evening.  . propylthiouracil (PTU) 50 MG tablet Take 4 tablets (200 mg total) by mouth 3 (three) times daily.  . traZODone (DESYREL) 50 MG tablet TAKE 1-2 TABLETS (50-100 MG TOTAL) BY MOUTH AT BEDTIME AS NEEDED FOR SLEEP.   Allergies  Allergen Reactions  . Codeine Rash    BP 140/68 (BP Location: Right Arm, Patient Position: Sitting, Cuff Size: Small)   Pulse 78   Temp 98.4 F (36.9 C) (Oral)   Ht 5'  7" (1.702 m)   Wt 117 lb (53.1 kg)   SpO2 100%   BMI 18.32 kg/m   Review of Systems  Constitutional: Negative.   HENT: Negative.   Eyes: Negative.   Respiratory: Negative.   Cardiovascular: Negative.   Gastrointestinal: Negative.   Endocrine: Negative.   Genitourinary: Negative.   Musculoskeletal: Negative.   Skin: Negative.   Allergic/Immunologic: Negative.   Neurological: Negative.   Hematological: Negative.   Psychiatric/Behavioral: Negative.    Objective:   Physical Exam  Constitutional: She is oriented to person, place, and time. She appears well-developed and well-nourished.  HENT:  Head: Normocephalic and atraumatic.  Right Ear: External ear normal.  Left Ear: External ear normal.  Nose: Nose normal.  Mouth/Throat: Oropharynx is clear and moist.  Eyes: Pupils are equal, round, and reactive to light. Conjunctivae and EOM are normal.  Neck: Normal range of motion. Neck supple.  Cardiovascular: Normal rate, regular rhythm, normal heart sounds and intact distal pulses.  Pulmonary/Chest: Effort normal and breath sounds normal.  Abdominal: Soft. Bowel sounds are normal.  Musculoskeletal: Normal range of motion.  Neurological: She is alert and oriented to person, place, and time.  Skin: Skin is warm and dry. Capillary refill takes less than 2 seconds.  Psychiatric: She has a normal mood and affect. Her behavior is normal. Judgment and thought content normal.  Nursing note and vitals reviewed.  Assessment & Plan:   1. Hyperthyroidism Thyroid levels have not been theurapeutic. We will re-evaluate TSH levels today. She will continue taking PTU as prescribed.  - CBC with Differential - Comprehensive metabolic panel - Thyroid Panel With TSH  2. Other insomnia Stable. She will continue to take Trazodone 100 QHS for sleep.   3. Follow up She will follow up in 3 months.   No orders of the defined types were placed in this encounter.   Raliegh IpNatalie Rael Yo,  MSN,  FNP-C [facility] [address]  [place], [place] [address] [phone]

## 2017-09-13 LAB — CBC WITH DIFFERENTIAL/PLATELET
Basophils Absolute: 0 10*3/uL (ref 0.0–0.2)
Basos: 1 %
EOS (ABSOLUTE): 0 10*3/uL (ref 0.0–0.4)
Eos: 0 %
Hematocrit: 45.3 % (ref 34.0–46.6)
Hemoglobin: 14.9 g/dL (ref 11.1–15.9)
Immature Grans (Abs): 0 10*3/uL (ref 0.0–0.1)
Immature Granulocytes: 0 %
Lymphocytes Absolute: 2.5 10*3/uL (ref 0.7–3.1)
Lymphs: 42 %
MCH: 29.1 pg (ref 26.6–33.0)
MCHC: 32.9 g/dL (ref 31.5–35.7)
MCV: 89 fL (ref 79–97)
Monocytes Absolute: 0.5 10*3/uL (ref 0.1–0.9)
Monocytes: 8 %
Neutrophils Absolute: 2.9 10*3/uL (ref 1.4–7.0)
Neutrophils: 49 %
Platelets: 272 10*3/uL (ref 150–450)
RBC: 5.12 x10E6/uL (ref 3.77–5.28)
RDW: 13.6 % (ref 12.3–15.4)
WBC: 6 10*3/uL (ref 3.4–10.8)

## 2017-09-13 LAB — COMPREHENSIVE METABOLIC PANEL
ALT: 71 IU/L — ABNORMAL HIGH (ref 0–32)
AST: 44 IU/L — ABNORMAL HIGH (ref 0–40)
Albumin/Globulin Ratio: 1.5 (ref 1.2–2.2)
Albumin: 4.4 g/dL (ref 3.5–5.5)
Alkaline Phosphatase: 136 IU/L — ABNORMAL HIGH (ref 39–117)
BUN/Creatinine Ratio: 11 (ref 9–23)
BUN: 8 mg/dL (ref 6–24)
Bilirubin Total: 0.4 mg/dL (ref 0.0–1.2)
CO2: 25 mmol/L (ref 20–29)
Calcium: 9.5 mg/dL (ref 8.7–10.2)
Chloride: 102 mmol/L (ref 96–106)
Creatinine, Ser: 0.73 mg/dL (ref 0.57–1.00)
GFR calc Af Amer: 108 mL/min/{1.73_m2} (ref >59)
GFR calc non Af Amer: 94 mL/min/{1.73_m2} (ref >59)
Globulin, Total: 2.9 g/dL (ref 1.5–4.5)
Glucose: 83 mg/dL (ref 65–99)
Potassium: 4.3 mmol/L (ref 3.5–5.2)
Sodium: 140 mmol/L (ref 134–144)
Total Protein: 7.3 g/dL (ref 6.0–8.5)

## 2017-09-13 LAB — THYROID PANEL WITH TSH
Free Thyroxine Index: 0.9 — ABNORMAL LOW (ref 1.2–4.9)
T3 Uptake Ratio: 20 % — ABNORMAL LOW (ref 24–39)
T4, Total: 4.6 ug/dL (ref 4.5–12.0)
TSH: 3.93 u[IU]/mL (ref 0.450–4.500)

## 2017-10-26 MED FILL — PROPYLTHIOURACIL 50 MG TABS: 50 | 30 days supply | Qty: 360 | Fill #2

## 2017-10-26 MED FILL — METOPROLOL TARTRATE 25 MG T: 25 | 30 days supply | Qty: 90 | Fill #0

## 2017-10-27 ENCOUNTER — Telehealth: Payer: Self-pay

## 2017-10-27 MED ORDER — TRAZODONE HCL 50 MG PO TABS: 50.0000 mg | ORAL_TABLET | Freq: Every evening | ORAL | 1 refills | Status: DC | PRN

## 2017-10-27 MED FILL — traZODone HCL 50 MG TABS: 50 | 30 days supply | Qty: 60 | Fill #0

## 2017-10-27 NOTE — Telephone Encounter (Signed)
Medication sent to pharmacy  

## 2017-11-20 ENCOUNTER — Other Ambulatory Visit: Payer: Self-pay | Admitting: Family Medicine

## 2017-11-20 ENCOUNTER — Telehealth: Payer: Self-pay | Admitting: Family Medicine

## 2017-11-20 NOTE — Telephone Encounter (Signed)
Reviewed labs with patient. TSH is within normal levels, so she will hold Propylthiouracil (PTU).

## 2017-12-07 ENCOUNTER — Ambulatory Visit: Payer: No Typology Code available for payment source | Admitting: Family Medicine

## 2017-12-15 MED FILL — METOPROLOL TARTRATE 25 MG T: 25 | 30 days supply | Qty: 90 | Fill #1

## 2018-01-19 MED FILL — traZODone HCL 50 MG TABS: 50 | 30 days supply | Qty: 60 | Fill #1

## 2018-01-19 MED FILL — METOPROLOL TARTRATE 25 MG T: 25 | 30 days supply | Qty: 90 | Fill #2

## 2018-02-13 ENCOUNTER — Encounter: Payer: Self-pay | Admitting: Family Medicine

## 2018-02-13 ENCOUNTER — Ambulatory Visit (INDEPENDENT_AMBULATORY_CARE_PROVIDER_SITE_OTHER): Payer: Self-pay | Admitting: Family Medicine

## 2018-02-13 VITALS — BP 116/80 | HR 84 | Temp 98.1°F | Ht 67.0 in | Wt 106.0 lb

## 2018-02-13 DIAGNOSIS — Z23 Encounter for immunization: Secondary | ICD-10-CM

## 2018-02-13 DIAGNOSIS — G4709 Other insomnia: Secondary | ICD-10-CM

## 2018-02-13 DIAGNOSIS — Z09 Encounter for follow-up examination after completed treatment for conditions other than malignant neoplasm: Secondary | ICD-10-CM

## 2018-02-13 DIAGNOSIS — E0590 Thyrotoxicosis, unspecified without thyrotoxic crisis or storm: Secondary | ICD-10-CM

## 2018-02-13 DIAGNOSIS — F329 Major depressive disorder, single episode, unspecified: Secondary | ICD-10-CM

## 2018-02-13 DIAGNOSIS — F32A Depression, unspecified: Secondary | ICD-10-CM

## 2018-02-13 LAB — POCT URINALYSIS DIP (MANUAL ENTRY)
Bilirubin, UA: NEGATIVE
Blood, UA: NEGATIVE
Glucose, UA: NEGATIVE mg/dL
Ketones, POC UA: NEGATIVE mg/dL
Leukocytes, UA: NEGATIVE
Nitrite, UA: NEGATIVE
Protein Ur, POC: 30 mg/dL — AB
Spec Grav, UA: >=1.03 — AB (ref 1.010–1.025)
Urobilinogen, UA: 4 E.U./dL — AB
pH, UA: 6 (ref 5.0–8.0)

## 2018-02-13 MED ORDER — AMITRIPTYLINE HCL 25 MG PO TABS: 25.0000 mg | ORAL_TABLET | Freq: Every day | ORAL | 3 refills | Status: DC

## 2018-02-13 MED FILL — AMITRIPTYLINE HCL 25 MG TAB: 25 | 30 days supply | Qty: 30 | Fill #0

## 2018-02-13 NOTE — Patient Instructions (Signed)
Amitriptyline tablets What is this medicine? AMITRIPTYLINE (a mee TRIP ti leen) is used to treat depression. This medicine may be used for other purposes; ask your health care provider or pharmacist if you have questions. COMMON BRAND NAME(S): Elavil, Vanatrip What should I tell my health care provider before I take this medicine? They need to know if you have any of these conditions: -an alcohol problem -asthma, difficulty breathing -bipolar disorder or schizophrenia -difficulty passing urine, prostate trouble -glaucoma -heart disease or previous heart attack -liver disease -over active thyroid -seizures -thoughts or plans of suicide, a previous suicide attempt, or family history of suicide attempt -an unusual or allergic reaction to amitriptyline, other medicines, foods, dyes, or preservatives -pregnant or trying to get pregnant -breast-feeding How should I use this medicine? Take this medicine by mouth with a drink of water. Follow the directions on the prescription label. You can take the tablets with or without food. Take your medicine at regular intervals. Do not take it more often than directed. Do not stop taking this medicine suddenly except upon the advice of your doctor. Stopping this medicine too quickly may cause serious side effects or your condition may worsen. A special MedGuide will be given to you by the pharmacist with each prescription and refill. Be sure to read this information carefully each time. Talk to your pediatrician regarding the use of this medicine in children. Special care may be needed. Overdosage: If you think you have taken too much of this medicine contact a poison control center or emergency room at once. NOTE: This medicine is only for you. Do not share this medicine with others. What if I miss a dose? If you miss a dose, take it as soon as you can. If it is almost time for your next dose, take only that dose. Do not take double or extra doses. What  may interact with this medicine? Do not take this medicine with any of the following medications: -arsenic trioxide -certain medicines used to regulate abnormal heartbeat or to treat other heart conditions -cisapride -droperidol -halofantrine -linezolid -MAOIs like Carbex, Eldepryl, Marplan, Nardil, and Parnate -methylene blue -other medicines for mental depression -phenothiazines like perphenazine, thioridazine and chlorpromazine -pimozide -probucol -procarbazine -sparfloxacin -St. John's Wort -ziprasidone This medicine may also interact with the following medications: -atropine and related drugs like hyoscyamine, scopolamine, tolterodine and others -barbiturate medicines for inducing sleep or treating seizures, like phenobarbital -cimetidine -disulfiram -ethchlorvynol -thyroid hormones such as levothyroxine This list may not describe all possible interactions. Give your health care provider a list of all the medicines, herbs, non-prescription drugs, or dietary supplements you use. Also tell them if you smoke, drink alcohol, or use illegal drugs. Some items may interact with your medicine. What should I watch for while using this medicine? Tell your doctor if your symptoms do not get better or if they get worse. Visit your doctor or health care professional for regular checks on your progress. Because it may take several weeks to see the full effects of this medicine, it is important to continue your treatment as prescribed by your doctor. Patients and their families should watch out for new or worsening thoughts of suicide or depression. Also watch out for sudden changes in feelings such as feeling anxious, agitated, panicky, irritable, hostile, aggressive, impulsive, severely restless, overly excited and hyperactive, or not being able to sleep. If this happens, especially at the beginning of treatment or after a change in dose, call your health care professional. You may get   drowsy or  dizzy. Do not drive, use machinery, or do anything that needs mental alertness until you know how this medicine affects you. Do not stand or sit up quickly, especially if you are an older patient. This reduces the risk of dizzy or fainting spells. Alcohol may interfere with the effect of this medicine. Avoid alcoholic drinks. Do not treat yourself for coughs, colds, or allergies without asking your doctor or health care professional for advice. Some ingredients can increase possible side effects. Your mouth may get dry. Chewing sugarless gum or sucking hard candy, and drinking plenty of water will help. Contact your doctor if the problem does not go away or is severe. This medicine may cause dry eyes and blurred vision. If you wear contact lenses you may feel some discomfort. Lubricating drops may help. See your eye doctor if the problem does not go away or is severe. This medicine can cause constipation. Try to have a bowel movement at least every 2 to 3 days. If you do not have a bowel movement for 3 days, call your doctor or health care professional. This medicine can make you more sensitive to the sun. Keep out of the sun. If you cannot avoid being in the sun, wear protective clothing and use sunscreen. Do not use sun lamps or tanning beds/booths. What side effects may I notice from receiving this medicine? Side effects that you should report to your doctor or health care professional as soon as possible: -allergic reactions like skin rash, itching or hives, swelling of the face, lips, or tongue -anxious -breathing problems -changes in vision -confusion -elevated mood, decreased need for sleep, racing thoughts, impulsive behavior -eye pain -fast, irregular heartbeat -feeling faint or lightheaded, falls -feeling agitated, angry, or irritable -fever with increased sweating -hallucination, loss of contact with reality -seizures -stiff muscles -suicidal thoughts or other mood  changes -tingling, pain, or numbness in the feet or hands -trouble passing urine or change in the amount of urine -trouble sleeping -unusually weak or tired -vomiting -yellowing of the eyes or skin Side effects that usually do not require medical attention (report to your doctor or health care professional if they continue or are bothersome): -change in sex drive or performance -change in appetite or weight -constipation -dizziness -dry mouth -nausea -tired -tremors -upset stomach This list may not describe all possible side effects. Call your doctor for medical advice about side effects. You may report side effects to FDA at 1-800-FDA-1088. Where should I keep my medicine? Keep out of the reach of children. Store at room temperature between 20 and 25 degrees C (68 and 77 degrees F). Throw away any unused medicine after the expiration date. NOTE: This sheet is a summary. It may not cover all possible information. If you have questions about this medicine, talk to your doctor, pharmacist, or health care provider.  2018 Elsevier/Gold Standard (2015-07-24 12:14:15)  

## 2018-02-13 NOTE — Progress Notes (Signed)
Follow Up  Subjective:    Patient ID: Jennifer Stanton, female    DOB: 03/07/1961, 57 y.o.   MRN: [id number]  Chief Complaint  Patient presents with  . Follow-up    insomnia, weight loss    HPI  [name] is a 57 year old female with a past medical history of Thyroid Disease, Seizures, Cephalalgia, and Blurry Vision. She is here today for follow up.   Current Status: Since her last office visit, she is doing well with no complaints. She denies heart palpitations and anxiousness. She has increased depression and anxiety. She denies suicidal ideations, homicidal ideations, or auditory hallucinations.  She denies fevers, chills, fatigue, recent infections, weight loss, and night sweats. She has not had any headaches, visual changes, dizziness, and falls. No chest pain, heart palpitations, cough and shortness of breath reported. No reports of GI problems such as nausea, vomiting, diarrhea, and constipation. She has no reports of blood in stools, dysuria and hematuria. She denies pain today.   Review of Systems  Constitutional: Negative.   HENT: Negative.   Eyes: Negative.   Respiratory: Negative.   Cardiovascular: Negative.   Gastrointestinal: Negative.   Endocrine: Negative.   Genitourinary: Negative.   Musculoskeletal: Negative.   Skin: Negative.   Allergic/Immunologic: Negative.   Neurological: Negative.   Hematological: Negative.   Psychiatric/Behavioral: Positive for sleep disturbance (insomnia).   Objective:   Physical Exam  Constitutional: She is oriented to person, place, and time.  HENT:  Head: Normocephalic and atraumatic.  Right Ear: External ear normal.  Eyes: Pupils are equal, round, and reactive to light. Conjunctivae and EOM are normal.  Neck: Normal range of motion. Neck supple.  Cardiovascular: Normal rate, regular rhythm, normal heart sounds and intact distal pulses.  Pulmonary/Chest: Effort normal and breath sounds normal.  Abdominal: Soft. Bowel sounds  are normal.  Musculoskeletal: Normal range of motion.  Neurological: She is alert and oriented to person, place, and time.  Skin: Skin is warm and dry.  Psychiatric: She has a normal mood and affect. Her behavior is normal. Judgment and thought content normal.  Nursing note and vitals reviewed.  Assessment & Plan:     1. Hyperthyroidism We will re-evaluate TSH today.  - Thyroid Panel With TSH  2. Depression, unspecified depression type We will initiate Elavil today.  - Ambulatory referral to Psychiatry  3. Other insomnia She will take Trazodone 1 and 1/2 tablet QHS  Monitor.  - amitriptyline (ELAVIL) 25 MG tablet; Take 1 tablet (25 mg total) by mouth at bedtime.  Dispense: 30 tablet; Refill: 3  4. Need for immunization against influenza - Flu Vaccine QUAD 36+ mos IM  5. Follow up She will follow up in 2 months.  - POCT urinalysis dipstick  Meds ordered this encounter  Medications  . amitriptyline (ELAVIL) 25 MG tablet    Sig: Take 1 tablet (25 mg total) by mouth at bedtime.    Dispense:  30 tablet    Refill:  3   Jennifer IpNatalie Kage Willmann,  MSN, FNP-C Patient [facility] [address]  [place], [place] [address] [phone]

## 2018-02-14 LAB — THYROID PANEL WITH TSH
Free Thyroxine Index: 11.2 — ABNORMAL HIGH (ref 1.2–4.9)
T3 Uptake Ratio: 53 % — ABNORMAL HIGH (ref 24–39)
T4, Total: 21.1 ug/dL (ref 4.5–12.0)
TSH: <0.006 u[IU]/mL — ABNORMAL LOW (ref 0.450–4.500)

## 2018-03-06 ENCOUNTER — Ambulatory Visit (INDEPENDENT_AMBULATORY_CARE_PROVIDER_SITE_OTHER): Payer: Self-pay | Admitting: Family Medicine

## 2018-03-06 ENCOUNTER — Encounter: Payer: Self-pay | Admitting: Family Medicine

## 2018-03-06 VITALS — BP 140/68 | HR 100 | Temp 98.6°F | Ht 67.0 in | Wt 107.8 lb

## 2018-03-06 DIAGNOSIS — E0590 Thyrotoxicosis, unspecified without thyrotoxic crisis or storm: Secondary | ICD-10-CM

## 2018-03-06 DIAGNOSIS — R599 Enlarged lymph nodes, unspecified: Secondary | ICD-10-CM

## 2018-03-06 DIAGNOSIS — R002 Palpitations: Secondary | ICD-10-CM

## 2018-03-06 DIAGNOSIS — I10 Essential (primary) hypertension: Secondary | ICD-10-CM

## 2018-03-06 DIAGNOSIS — J449 Chronic obstructive pulmonary disease, unspecified: Secondary | ICD-10-CM

## 2018-03-06 DIAGNOSIS — Z09 Encounter for follow-up examination after completed treatment for conditions other than malignant neoplasm: Secondary | ICD-10-CM

## 2018-03-06 MED ORDER — PROPYLTHIOURACIL 50 MG PO TABS: 200.0000 mg | ORAL_TABLET | Freq: Three times a day (TID) | ORAL | 3 refills | Status: DC

## 2018-03-06 MED ORDER — ALBUTEROL SULFATE HFA 108 (90 BASE) MCG/ACT IN AERS: 2.0000 | INHALATION_SPRAY | RESPIRATORY_TRACT | 11 refills | Status: DC | PRN

## 2018-03-06 MED ORDER — METOPROLOL TARTRATE 25 MG PO TABS: ORAL_TABLET | ORAL | 3 refills | Status: DC

## 2018-03-06 NOTE — Progress Notes (Signed)
Sick Visit  Subjective:    Patient ID: Jennifer Stanton, female    DOB: 10/20/1960, 57 y.o.   MRN: [id number]  HPI  Jennifer Stanton is a 57 year old female with a past medical history of Thyroid Disease, Seizures, Heart Palpitations and Blurry Vision. She is here today for a sick visit.   Current Status: Since her last office visit, she has c/o occasional hearth palpitations. She has not taken her blood pressure medications in over a week because she has not been able to afford refills. She has occasionally headaches and dizziness with position changes. Denies severe headaches, confusion, seizures, double vision, and blurred vision, nausea and vomiting. She is having mild anxiety today.   She denies fevers, chills, fatigue, recent infections, weight loss, and night sweats. No reports of GI problems such as diarrhea, and constipation. She has no reports of blood in stools, dysuria and hematuria. She denies pain today.   Review of Systems  Constitutional: Negative.   HENT: Negative.   Eyes: Negative.   Respiratory: Negative.   Cardiovascular: Positive for palpitations (Occasional).  Gastrointestinal: Negative.   Endocrine: Negative.   Genitourinary: Negative.   Musculoskeletal: Negative.   Skin: Negative.   Allergic/Immunologic: Negative.   Neurological: Positive for dizziness and headaches.  Hematological: Negative.   Psychiatric/Behavioral: The patient is nervous/anxious (mild ).        Objective:   Physical Exam Vitals signs and nursing note reviewed.  Constitutional:      Appearance: Normal appearance. She is normal weight.  HENT:     Head: Normocephalic and atraumatic.     Right Ear: External ear normal.     Left Ear: External ear normal.     Nose: Nose normal.     Mouth/Throat:     Mouth: Mucous membranes are moist.     Pharynx: Oropharynx is clear.  Eyes:     Conjunctiva/sclera: Conjunctivae normal.  Neck:     Musculoskeletal: Normal range of motion and neck supple.   Cardiovascular:     Rate and Rhythm: Normal rate and regular rhythm.     Pulses: Normal pulses.     Heart sounds: Normal heart sounds.  Pulmonary:     Effort: Pulmonary effort is normal.     Breath sounds: Normal breath sounds.  Abdominal:     General: Abdomen is flat. Bowel sounds are normal.     Palpations: Abdomen is soft.  Musculoskeletal: Normal range of motion.  Skin:    General: Skin is warm and dry.     Capillary Refill: Capillary refill takes less than 2 seconds.  Neurological:     General: No focal deficit present.     Mental Status: She is alert and oriented to person, place, and time.  Psychiatric:        Mood and Affect: Mood normal.        Thought Content: Thought content normal.        Judgment: Judgment normal.     Comments: Mildly anxious    Assessment & Plan:   1. Hyperthyroidism TSH level is decreased. We will re-initiate Propylthiouracil. We will reassess Thyroid levels in 1 month.  - propylthiouracil (PTU) 50 MG tablet; Take 4 tablets (200 mg total) by mouth 3 (three) times daily.  Dispense: 360 tablet; Refill: 3  2. Palpitations Occasional. She will report to office or ED if worsens. Refill Metoprolol and re-start Propylthiouracil. Contacted [facility] for co-pays. Patient states that she will be able to afford  and will pick up refills today.  - propylthiouracil (PTU) 50 MG tablet; Take 4 tablets (200 mg total) by mouth 3 (three) times daily.  Dispense: 360 tablet; Refill: 3  3. Hypertension, unspecified type - metoprolol tartrate (LOPRESSOR) 25 MG tablet; Take 2 tablets (50 mg) once daily in AM. Take 1 tablet  (25 mg) once daily in the evening.  Dispense: 90 tablet; Refill: 3  4. Chronic obstructive pulmonary disease, unspecified COPD type (HCC) Stable. Monitor.  - albuterol (PROVENTIL HFA;VENTOLIN HFA) 108 (90 Base) MCG/ACT inhaler; Inhale 2 puffs into the lungs every 4 (four) hours as needed for wheezing or shortness of breath  (cough, shortness of breath or wheezing.).  Dispense: 1 Inhaler; Refill: 11  5. Glands swollen We will order Ultrasound of Thyroid to assess swollen cervical lymph nodes. Monitor.  - US THYROID; Future  6. Follow up She will follow up in 1 month to reassess Thyroid level.   Meds ordered this encounter  Medications  . DISCONTD: propylthiouracil (PTU) 50 MG tablet    Sig: Take 4 tablets (200 mg total) by mouth 3 (three) times daily.    Dispense:  360 tablet    Refill:  3    Medication dose change  . metoprolol tartrate (LOPRESSOR) 25 MG tablet    Sig: Take 2 tablets (50 mg) once daily in AM. Take 1 tablet  (25 mg) once daily in the evening.    Dispense:  90 tablet    Refill:  3  . albuterol (PROVENTIL HFA;VENTOLIN HFA) 108 (90 Base) MCG/ACT inhaler    Sig: Inhale 2 puffs into the lungs every 4 (four) hours as needed for wheezing or shortness of breath (cough, shortness of breath or wheezing.).    Dispense:  1 Inhaler    Refill:  11  . propylthiouracil (PTU) 50 MG tablet    Sig: Take 4 tablets (200 mg total) by mouth 3 (three) times daily.    Dispense:  360 tablet    Refill:  3    Medication dose change    Raliegh IpNatalie Erykah Lippert,  MSN, FNP-C Patient Care [facility] [address]  [place], [place] [address] [phone]

## 2018-03-07 ENCOUNTER — Encounter: Payer: Self-pay | Admitting: Family Medicine

## 2018-03-08 ENCOUNTER — Other Ambulatory Visit: Payer: Self-pay

## 2018-03-08 DIAGNOSIS — E0590 Thyrotoxicosis, unspecified without thyrotoxic crisis or storm: Secondary | ICD-10-CM

## 2018-03-08 DIAGNOSIS — R002 Palpitations: Secondary | ICD-10-CM

## 2018-03-08 MED ORDER — PROPYLTHIOURACIL 50 MG PO TABS: 200.0000 mg | ORAL_TABLET | Freq: Three times a day (TID) | ORAL | 3 refills | Status: DC

## 2018-03-08 MED FILL — PROPYLTHIOURACIL 50 MG TABS: 50 | 30 days supply | Qty: 360 | Fill #0

## 2018-03-08 NOTE — Telephone Encounter (Signed)
Medication sent to pharmacy  

## 2018-03-12 NOTE — Telephone Encounter (Signed)
-----   Message from Kallie Locks, FNP sent at 03/11/2018  4:30 PM EST ----- Regarding: "Refills" Please assess if patient has picked up medication refills from [facility]. If not please inform patient that medications are ready for pick up. Thank you.

## 2018-03-12 NOTE — Telephone Encounter (Signed)
Patient states she will pick up medication today.

## 2018-03-15 ENCOUNTER — Ambulatory Visit (HOSPITAL_COMMUNITY)
Admission: RE | Admit: 2018-03-15 | Discharge: 2018-03-15 | Disposition: A | Discharge status: Home / Self Care | Payer: No Typology Code available for payment source | Source: Ambulatory Visit | Attending: Family Medicine | Admitting: Family Medicine

## 2018-03-15 DIAGNOSIS — R599 Enlarged lymph nodes, unspecified: Secondary | ICD-10-CM

## 2018-03-16 ENCOUNTER — Telehealth: Payer: Self-pay

## 2018-03-16 NOTE — Telephone Encounter (Signed)
Results of Ultrasound reviewed with patient today.

## 2018-03-16 NOTE — Telephone Encounter (Signed)
Patient calling for US results

## 2018-04-05 ENCOUNTER — Other Ambulatory Visit: Payer: Self-pay | Admitting: Family Medicine

## 2018-04-05 ENCOUNTER — Other Ambulatory Visit: Payer: Self-pay

## 2018-04-05 MED ORDER — TRAZODONE HCL 50 MG PO TABS: 50.0000 mg | ORAL_TABLET | Freq: Every evening | ORAL | 1 refills | Status: DC | PRN

## 2018-04-05 MED FILL — AMITRIPTYLINE HCL 25 MG TAB: 25 | 30 days supply | Qty: 30 | Fill #1

## 2018-04-05 MED FILL — METOPROLOL TARTRATE 25 MG T: 25 | 30 days supply | Qty: 90 | Fill #1

## 2018-04-05 MED FILL — PROPYLTHIOURACIL 50 MG TABS: 50 | 30 days supply | Qty: 360 | Fill #1

## 2018-04-05 MED FILL — traZODone HCL 50 MG TABS: 50 | 30 days supply | Qty: 60 | Fill #0

## 2018-04-05 NOTE — Telephone Encounter (Signed)
Medication sent to pharmacy  

## 2018-04-10 MED FILL — BREO ELLIPTA 100-25 MCG INH: 100-25 | 60 days supply | Qty: 60 | Fill #0

## 2018-04-10 MED FILL — !VENTOLIN HFA INHALER: 108 (90 BAS | 30 days supply | Qty: 18 | Fill #0

## 2018-04-17 ENCOUNTER — Encounter: Payer: Self-pay | Admitting: Family Medicine

## 2018-04-17 ENCOUNTER — Ambulatory Visit (INDEPENDENT_AMBULATORY_CARE_PROVIDER_SITE_OTHER): Payer: Self-pay | Admitting: Family Medicine

## 2018-04-17 VITALS — BP 124/72 | HR 60 | Temp 98.5°F | Ht 67.0 in | Wt 113.0 lb

## 2018-04-17 DIAGNOSIS — E0590 Thyrotoxicosis, unspecified without thyrotoxic crisis or storm: Secondary | ICD-10-CM

## 2018-04-17 DIAGNOSIS — Z131 Encounter for screening for diabetes mellitus: Secondary | ICD-10-CM

## 2018-04-17 DIAGNOSIS — Z09 Encounter for follow-up examination after completed treatment for conditions other than malignant neoplasm: Secondary | ICD-10-CM

## 2018-04-17 DIAGNOSIS — I10 Essential (primary) hypertension: Secondary | ICD-10-CM

## 2018-04-17 DIAGNOSIS — R002 Palpitations: Secondary | ICD-10-CM

## 2018-04-17 DIAGNOSIS — R599 Enlarged lymph nodes, unspecified: Secondary | ICD-10-CM

## 2018-04-17 LAB — POCT URINALYSIS DIP (MANUAL ENTRY)
Bilirubin, UA: NEGATIVE
Blood, UA: NEGATIVE
Glucose, UA: NEGATIVE mg/dL
Ketones, POC UA: NEGATIVE mg/dL
Leukocytes, UA: NEGATIVE
Nitrite, UA: NEGATIVE
Protein Ur, POC: NEGATIVE mg/dL
Spec Grav, UA: 1.02 (ref 1.010–1.025)
Urobilinogen, UA: 2 E.U./dL — AB
pH, UA: 7 (ref 5.0–8.0)

## 2018-04-17 LAB — POCT GLYCOSYLATED HEMOGLOBIN (HGB A1C): Hemoglobin A1C: 4.9 % (ref 4.0–5.6)

## 2018-04-17 NOTE — Progress Notes (Signed)
[facility]   Established Patient Office Visit  Subjective:  Patient ID: Jennifer Stanton, female    DOB: May 04, 1960  Age: 58 y.o. MRN: [id number]  CC:  Chief Complaint  Patient presents with  . Follow-up    Chronic condition     HPI Jennifer Stanton is a 58 year old female who  presents for follow up today.   Past Medical History:  Diagnosis Date  . Blurry vision 06/20/2014  . Cephalalgia 06/20/2014  . Heart palpitations   . History of seizure 06/20/2014  . Seizures (HCC)   . Thyroid disease    Current Status: Since her last office visit, she is doing well with no complaints. She is excited about her recent weight gain. She is now receiving unemployment and food stamps. She is accompanied by her grand-daughter today.   She denies fevers, chills, fatigue, recent infections, weight loss, and night sweats. She has not had any visual changes, and falls. No chest pain, heart palpitations, cough and shortness of breath reported. No reports of GI problems such as nausea, vomiting, diarrhea, and constipation. She has no reports of blood in stools, dysuria and hematuria. No depression or anxiety reported.   Past Surgical History:  Procedure Laterality Date  . INNER EAR SURGERY  at age 77  . TUBAL LIGATION  Jul 05, 1991    Family History  Problem Relation Age of Onset  . Cancer Mother   . COPD Mother   . Breast cancer Mother 43  . Heart disease Father   . Hemochromatosis Brother     Social History   Socioeconomic History  . Marital status: Divorced    Spouse name: Not on file  . Number of children: Not on file  . Years of education: Not on file  . Highest education level: Not on file  Occupational History  . Not on file  Social Needs  . Financial resource strain: Not on file  . Food insecurity:    Worry: Not on file    Inability: Not on file  . Transportation needs:    Medical: Not on file    Non-medical: Not on file    Tobacco Use  . Smoking status: Current Every Day Smoker    Packs/day: 0.10    Types: Cigarettes  . Smokeless tobacco: Never Used  Substance and Sexual Activity  . Alcohol use: Yes    Comment: occ  . Drug use: Yes    Types: Marijuana    Comment: today   . Sexual activity: Yes    Birth control/protection: Post-menopausal  Lifestyle  . Physical activity:    Days per week: 2 days    Minutes per session: 50 min  . Stress: Very much  Relationships  . Social connections:    Talks on phone: More than three times a week    Gets together: Once a week    Attends religious service: Never    Active member of club or organization: No    Attends meetings of clubs or organizations: Never    Relationship status: Divorced  . Intimate partner violence:    Fear of current or ex partner: No    Emotionally abused: No    Physically abused: No    Forced sexual activity: No  Other Topics Concern  . Not on file  Social History Narrative  . Not on file    Outpatient Medications Prior to Visit  Medication Sig Dispense Refill  .  albuterol (PROVENTIL HFA;VENTOLIN HFA) 108 (90 Base) MCG/ACT inhaler Inhale 2 puffs into the lungs every 4 (four) hours as needed for wheezing or shortness of breath (cough, shortness of breath or wheezing.). 1 Inhaler 11  . amitriptyline (ELAVIL) 25 MG tablet Take 1 tablet (25 mg total) by mouth at bedtime. 30 tablet 3  . fluticasone furoate-vilanterol (BREO ELLIPTA) 100-25 MCG/INH AEPB Inhale 1 puff into the lungs daily. 60 each 1  . metoprolol tartrate (LOPRESSOR) 25 MG tablet Take 2 tablets (50 mg) once daily in AM. Take 1 tablet  (25 mg) once daily in the evening. 90 tablet 3  . propylthiouracil (PTU) 50 MG tablet Take 4 tablets (200 mg total) by mouth 3 (three) times daily. 360 tablet 3  . traZODone (DESYREL) 50 MG tablet Take 1-2 tablets (50-100 mg total) by mouth at bedtime as needed for sleep. 60 tablet 1   No facility-administered medications prior to visit.      Allergies  Allergen Reactions  . Codeine Rash    ROS Review of Systems  Constitutional: Negative.   HENT: Negative.   Eyes: Negative.   Respiratory: Negative.   Cardiovascular: Negative.   Gastrointestinal: Negative.   Endocrine: Negative.   Genitourinary: Negative.   Musculoskeletal: Negative.   Skin: Negative.   Allergic/Immunologic: Negative.   Neurological: Positive for dizziness and headaches.  Hematological: Negative.   Psychiatric/Behavioral: Negative.    Objective:    Physical Exam  Constitutional: She is oriented to person, place, and time. She appears well-developed and well-nourished.  HENT:  Head: Normocephalic and atraumatic.  Eyes: Conjunctivae are normal.  Neck: Normal range of motion. Neck supple.  Cardiovascular: Normal rate, normal heart sounds and intact distal pulses.  Pulmonary/Chest: Effort normal and breath sounds normal.  Abdominal: Soft. Bowel sounds are normal.  Musculoskeletal: Normal range of motion.  Neurological: She is alert and oriented to person, place, and time. She has normal reflexes.  Skin: Skin is warm and dry.  Psychiatric: She has a normal mood and affect. Her behavior is normal. Judgment and thought content normal.  Nursing note and vitals reviewed.  BP 124/72 (BP Location: Left Arm, Patient Position: Sitting, Cuff Size: Small)   Pulse 60   Temp 98.5 F (36.9 C) (Oral)   Ht 5\' 7"  (1.702 m)   Wt 113 lb (51.3 kg)   SpO2 98%   BMI 17.70 kg/m  Wt Readings from Last 3 Encounters:  04/17/18 113 lb (51.3 kg)  03/06/18 107 lb 12.8 oz (48.9 kg)  02/13/18 106 lb (48.1 kg)    Health Maintenance Due  Topic Date Due  . Hepatitis C Screening  10-Feb-1961  . HIV Screening  01/05/1978  . COLONOSCOPY  01/05/2013    There are no preventive care reminders to display for this patient.  Lab Results  Component Value Date   TSH <0.006 (L) 02/13/2018   Lab Results  Component Value Date   WBC 6.0 09/12/2017   HGB 14.9  09/12/2017   HCT 45.3 09/12/2017   MCV 89 09/12/2017   PLT 272 09/12/2017   Lab Results  Component Value Date   NA 140 09/12/2017   K 4.3 09/12/2017   CO2 25 09/12/2017   GLUCOSE 83 09/12/2017   BUN 8 09/12/2017   CREATININE 0.73 09/12/2017   BILITOT 0.4 09/12/2017   ALKPHOS 136 (H) 09/12/2017   AST 44 (H) 09/12/2017   ALT 71 (H) 09/12/2017   PROT 7.3 09/12/2017   ALBUMIN 4.4 09/12/2017   CALCIUM 9.5  09/12/2017   ANIONGAP 7 02/14/2017   No results found for: CHOL No results found for: HDL No results found for: LDLCALC No results found for: TRIG No results found for: [facility] Lab Results  Component Value Date   HGBA1C 4.9 04/17/2018   Assessment & Plan:   1. Hyperthyroidism She will continue PTU (Propylithiouracil) for Hyperthyroidism.   2. Palpitations Stable since re-starting PTU (Propylithiouracil) for Hyperthyroidism. She will continue to refill medication monthly as prescribed. Monitor.  3. Hypertension, unspecified type Antihypertensive medications are effective. Blood pressure is stable today at 124/72. She will continue to decrease high sodium intake, excessive alcohol intake, increase potassium intake, smoking cessation, and increase physical activity of at least 30 minutes of cardio activity daily. She will continue to follow Heart Healthy or DASH diet.  4. Glands swollen  5. Screening for diabetes mellitus Hgb A1c is in normal range of 4.9 today.  She will continue to decrease foods/beverages high in sugars and carbs and follow Heart Healthy or DASH diet. Increase physical activity to at least 30 minutes cardio exercise daily.  - POCT glycosylated hemoglobin (Hb A1C) - POCT urinalysis dipstick  6. Follow up She will follow up in 6 months.   No orders of the defined types were placed in this encounter.  Raliegh IpNatalie Shai Mckenzie,  MSN, FNP-C Patient Care [facility] [address]  [place], [place] [address] [phone]   Problem List  Items Addressed This Visit    None    Visit Diagnoses    Hyperthyroidism    -  Primary   Palpitations       Hypertension, unspecified type       Glands swollen       Palpitation       Screening for diabetes mellitus       Relevant Orders   POCT glycosylated hemoglobin (Hb A1C) (Completed)   POCT urinalysis dipstick (Completed)   Follow up          No orders of the defined types were placed in this encounter.   Follow-up: Return in about 6 months (around 10/16/2018).    Kallie LocksNatalie M Autry Droege, FNP

## 2018-05-11 IMAGING — DX DG CHEST 2V
2 series · 2 of 2 positions shown · non-contrast
Comparison: 08/25/2005 chest radiographs.

CLINICAL DATA: 56-year-old female with chest discomfort, dizziness
for the past few days. Smoker.

EXAM:
CHEST  2 VIEW

[chest pa]
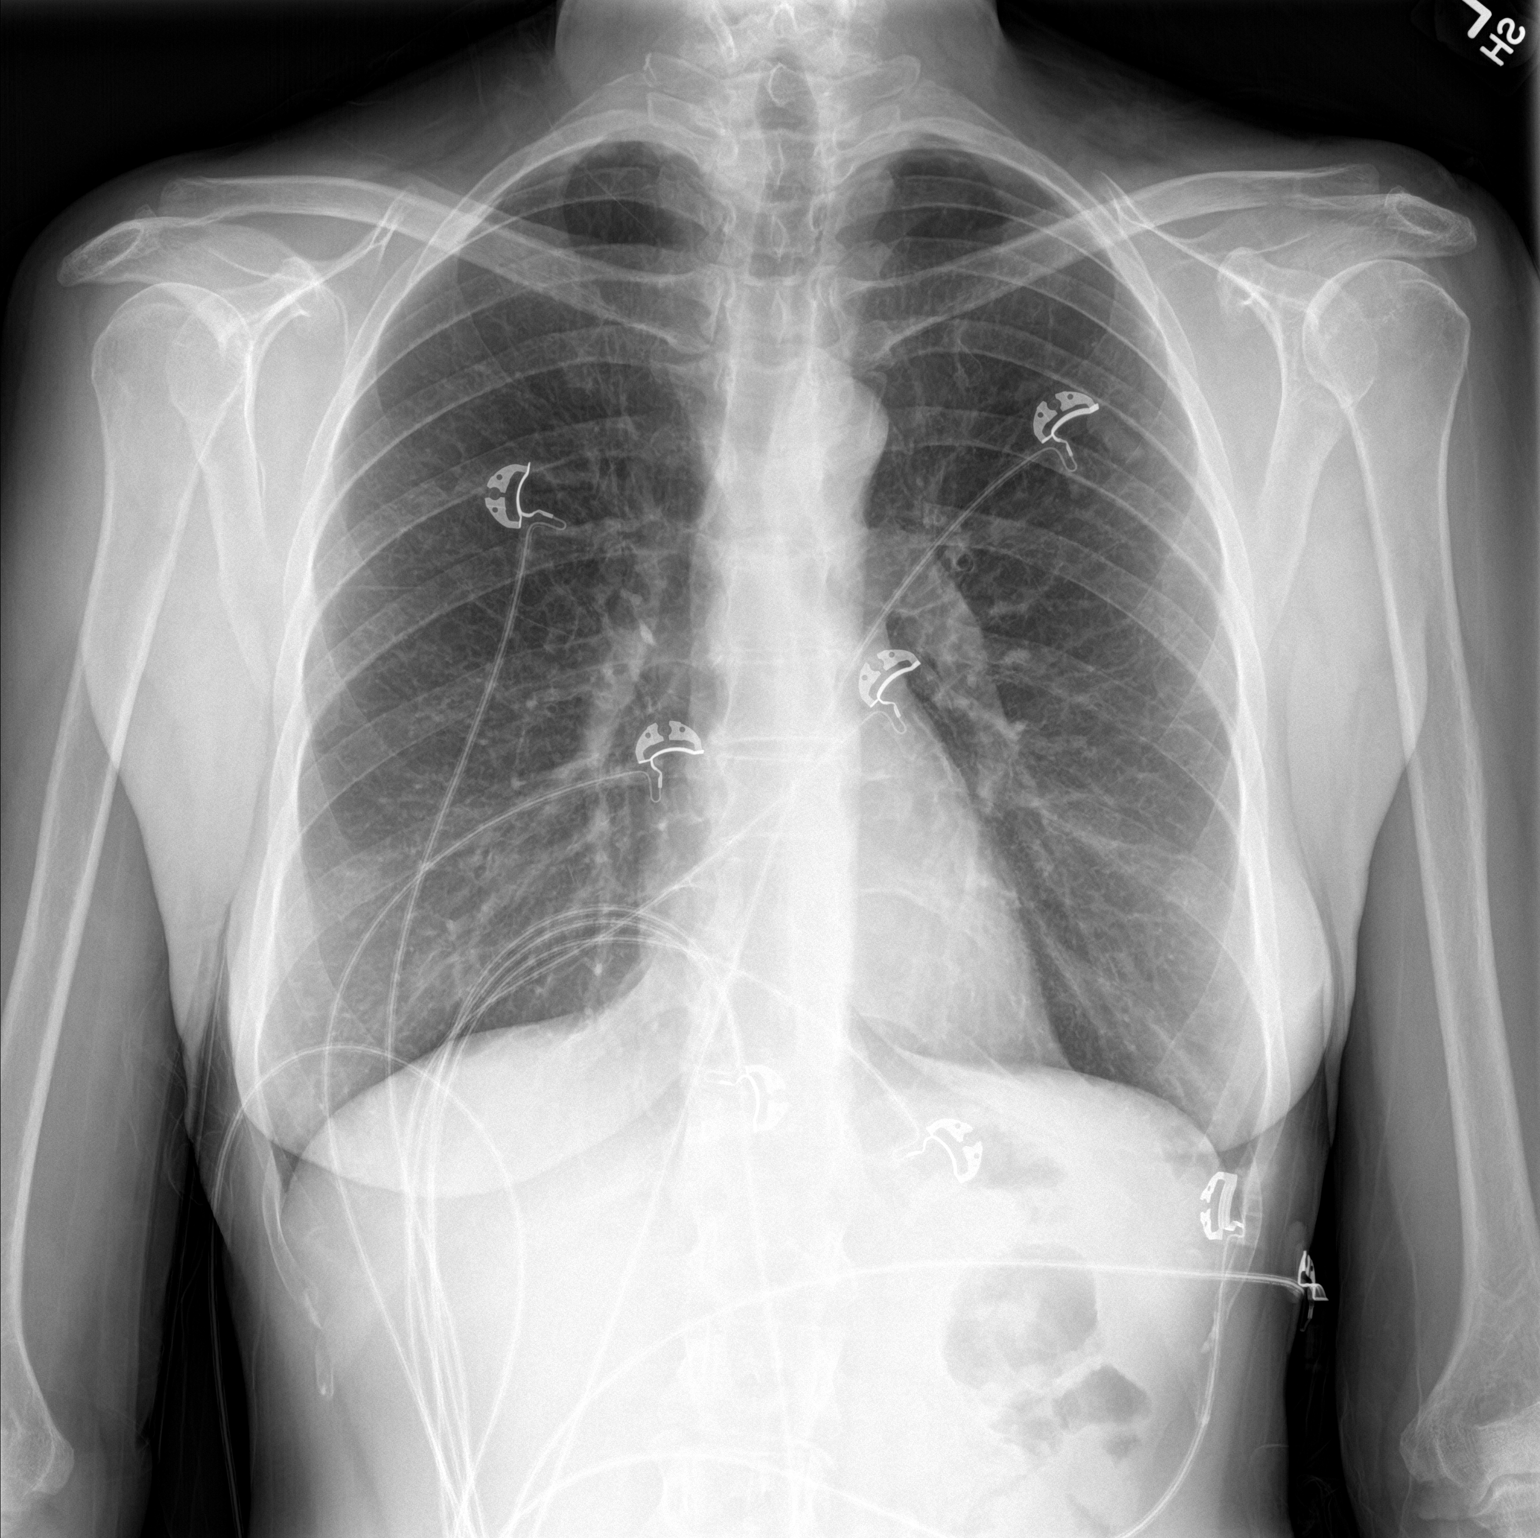

[chest lat]
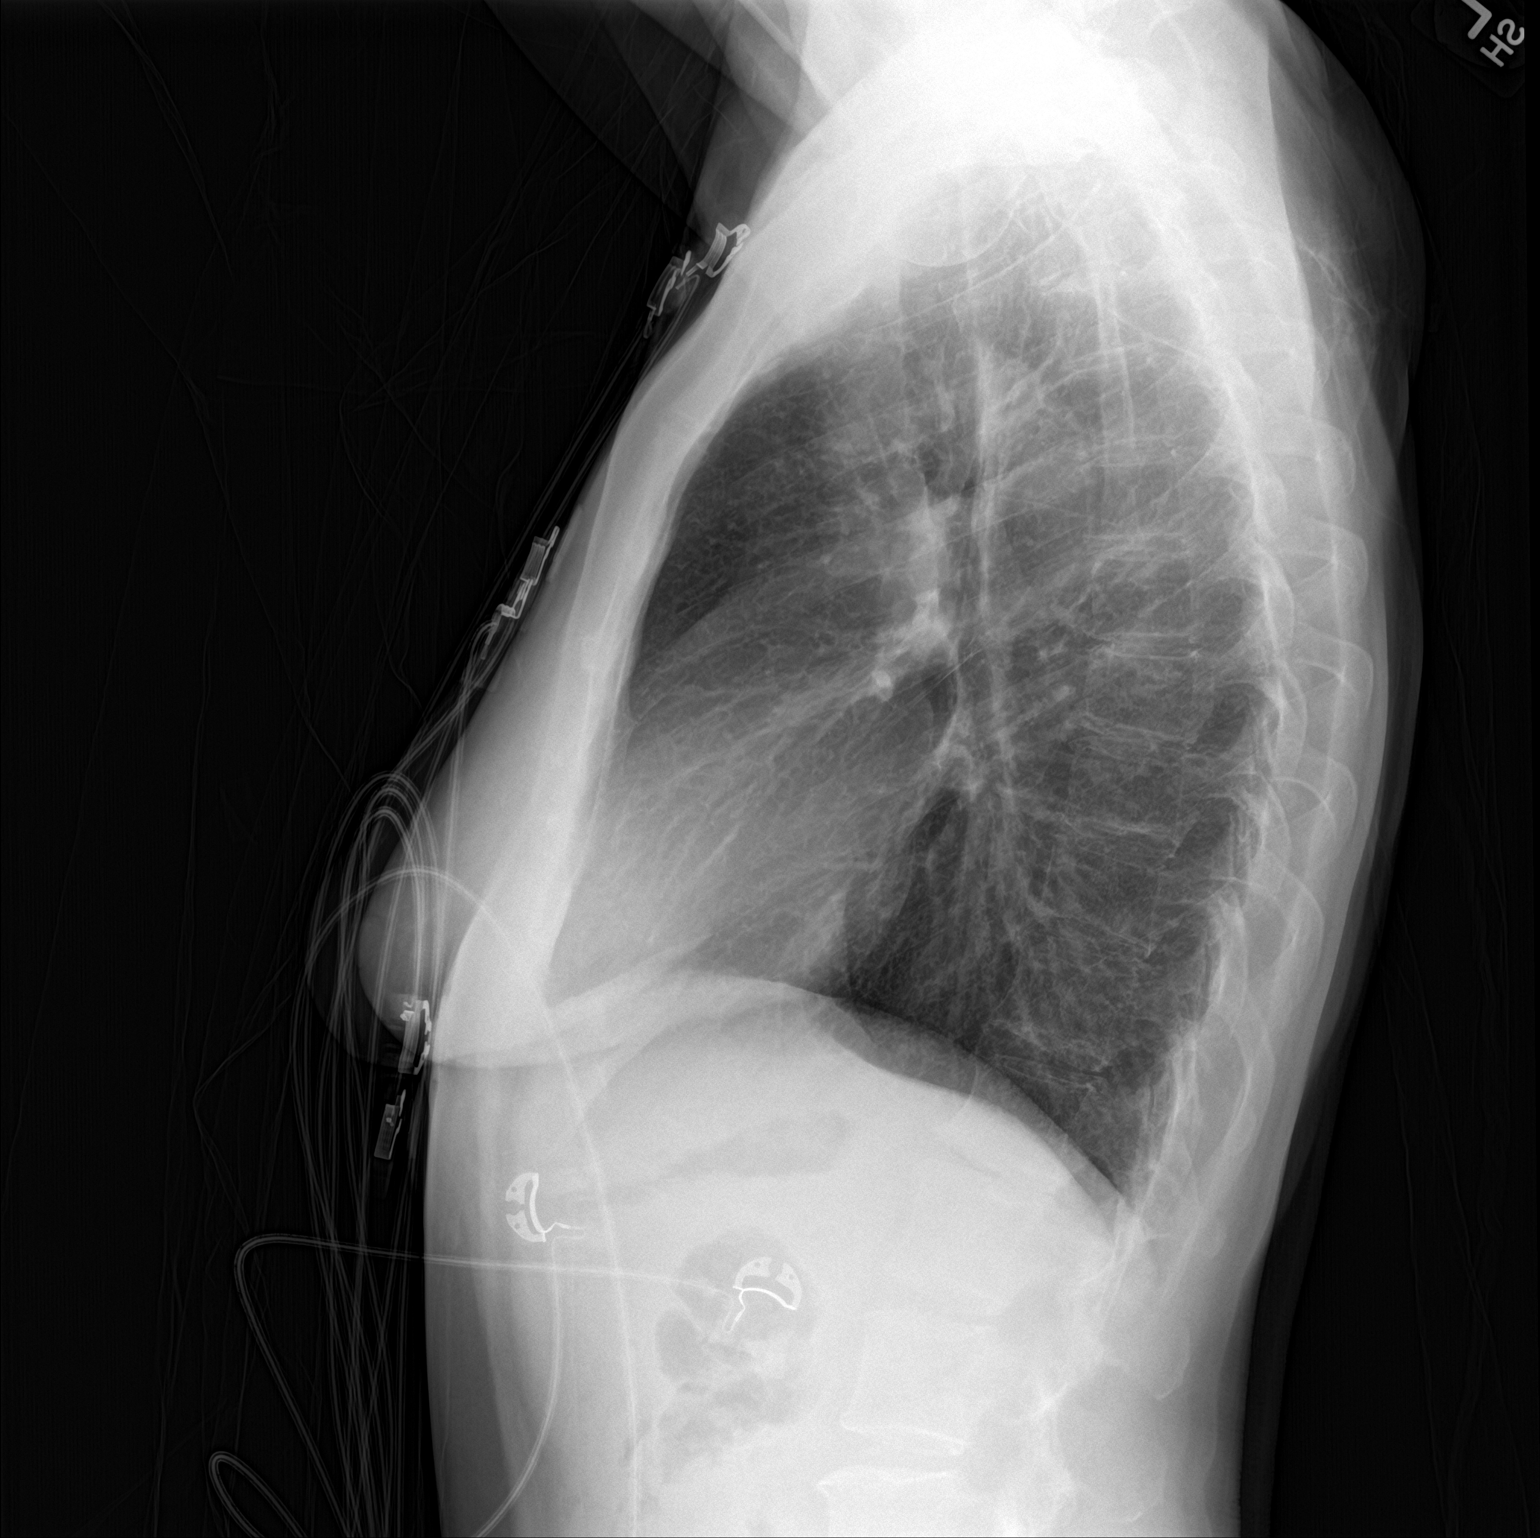

[2 of 2 positions shown; findings below may reference images not displayed]

FINDINGS: Lung volumes have mildly increased since 7998 and are at the upper
limits of normal to hyperinflated. Mediastinal contours remain
normal. Visualized tracheal air column is within normal limits. No
pneumothorax, pulmonary edema, pleural effusion or confluent
pulmonary opacity. Mild chronic increased interstitial markings. No
acute osseous abnormality identified. Negative visible bowel gas
pattern.
IMPRESSION: No acute cardiopulmonary abnormality. Suspicion of increased
pulmonary hyperinflation since [DATE].

## 2018-05-21 MED FILL — traZODone HCL 50 MG TABS: 50 | 30 days supply | Qty: 60 | Fill #1

## 2018-05-21 MED FILL — METOPROLOL TARTRATE 25 MG T: 25 | 30 days supply | Qty: 90 | Fill #2

## 2018-05-21 MED FILL — PROPYLTHIOURACIL 50 MG TAB: 50 | 25 days supply | Qty: 300 | Fill #0

## 2018-05-21 MED FILL — AMITRIPTYLINE HCL 25 MG TAB: 25 | 30 days supply | Qty: 30 | Fill #2

## 2018-05-22 ENCOUNTER — Telehealth: Payer: Self-pay

## 2018-05-23 NOTE — Telephone Encounter (Signed)
Spoke with patient regarding the PTU medication and prices

## 2018-06-19 ENCOUNTER — Telehealth: Payer: Self-pay

## 2018-06-19 ENCOUNTER — Other Ambulatory Visit: Payer: Self-pay

## 2018-06-19 DIAGNOSIS — R002 Palpitations: Secondary | ICD-10-CM

## 2018-06-19 DIAGNOSIS — I10 Essential (primary) hypertension: Secondary | ICD-10-CM

## 2018-06-19 DIAGNOSIS — E0590 Thyrotoxicosis, unspecified without thyrotoxic crisis or storm: Secondary | ICD-10-CM

## 2018-06-19 MED ORDER — METOPROLOL TARTRATE 25 MG PO TABS: ORAL_TABLET | ORAL | 3 refills | Status: DC

## 2018-06-19 MED ORDER — TRAZODONE HCL 50 MG PO TABS: 50.0000 mg | ORAL_TABLET | Freq: Every evening | ORAL | 1 refills | Status: DC | PRN

## 2018-06-19 MED ORDER — PROPYLTHIOURACIL 50 MG PO TABS: 200.0000 mg | ORAL_TABLET | Freq: Three times a day (TID) | ORAL | 3 refills | Status: DC

## 2018-06-19 MED FILL — traZODone HCL 50 MG TABS: 50 | 30 days supply | Qty: 60 | Fill #0

## 2018-06-19 MED FILL — PROPYLTHIOURACIL 50 MG TABS: 50 | 360 days supply | Qty: 360 | Fill #0

## 2018-06-19 MED FILL — METOPROLOL TARTRATE 25 MG T: 25 | 30 days supply | Qty: 90 | Fill #0

## 2018-06-19 NOTE — Telephone Encounter (Signed)
Patient needs a refill on Amitriptyline. Also she is having ear pain again and would like to have ear drops sent to pharmacy

## 2018-06-20 ENCOUNTER — Other Ambulatory Visit: Payer: Self-pay | Admitting: Family Medicine

## 2018-06-20 DIAGNOSIS — G4709 Other insomnia: Secondary | ICD-10-CM

## 2018-06-20 MED ORDER — AMITRIPTYLINE HCL 25 MG PO TABS: 25.0000 mg | ORAL_TABLET | Freq: Every day | ORAL | 3 refills | Status: DC

## 2018-06-20 MED FILL — AMITRIPTYLINE HCL 25 MG TAB: 25 | 30 days supply | Qty: 30 | Fill #0

## 2018-06-20 NOTE — Telephone Encounter (Signed)
Patient notified and will call back and schedule office visit

## 2018-07-27 ENCOUNTER — Other Ambulatory Visit: Payer: Self-pay | Admitting: Family Medicine

## 2018-07-27 DIAGNOSIS — J449 Chronic obstructive pulmonary disease, unspecified: Secondary | ICD-10-CM

## 2018-07-27 DIAGNOSIS — R0602 Shortness of breath: Secondary | ICD-10-CM

## 2018-07-27 MED ORDER — FLUTICASONE FUROATE-VILANTEROL 100-25 MCG/INH IN AEPB: 1.0000 | INHALATION_SPRAY | Freq: Every day | RESPIRATORY_TRACT | 1 refills | Status: DC

## 2018-07-27 MED FILL — $BREO ELLIPTA 100-25 MCG IH: 100-25 MCG | 60 days supply | Qty: 120 | Fill #0

## 2018-07-27 MED FILL — PROPYLTHIOURACIL 50 MG TABS: 50 | 30 days supply | Qty: 360 | Fill #1

## 2018-07-27 MED FILL — ?METOPROLOL 25 MG TABLET: 25 | 30 days supply | Qty: 90 | Fill #1

## 2018-07-27 NOTE — Telephone Encounter (Signed)
Patient at [facility]. Rx request sent for review.

## 2018-08-13 ENCOUNTER — Encounter: Payer: Self-pay | Admitting: Family Medicine

## 2018-08-13 ENCOUNTER — Ambulatory Visit (INDEPENDENT_AMBULATORY_CARE_PROVIDER_SITE_OTHER): Payer: Self-pay | Admitting: Family Medicine

## 2018-08-13 ENCOUNTER — Other Ambulatory Visit: Payer: Self-pay

## 2018-08-13 VITALS — BP 121/76 | HR 80 | Temp 98.3°F | Resp 16 | Ht 67.0 in | Wt 126.0 lb

## 2018-08-13 DIAGNOSIS — H9201 Otalgia, right ear: Secondary | ICD-10-CM | POA: Insufficient documentation

## 2018-08-13 DIAGNOSIS — R002 Palpitations: Secondary | ICD-10-CM | POA: Insufficient documentation

## 2018-08-13 DIAGNOSIS — H6691 Otitis media, unspecified, right ear: Secondary | ICD-10-CM | POA: Insufficient documentation

## 2018-08-13 DIAGNOSIS — E0590 Thyrotoxicosis, unspecified without thyrotoxic crisis or storm: Secondary | ICD-10-CM | POA: Insufficient documentation

## 2018-08-13 DIAGNOSIS — Z09 Encounter for follow-up examination after completed treatment for conditions other than malignant neoplasm: Secondary | ICD-10-CM

## 2018-08-13 MED ORDER — AMOXICILLIN-POT CLAVULANATE 875-125 MG PO TABS: 1.0000 | ORAL_TABLET | Freq: Two times a day (BID) | ORAL | 0 refills | Status: AC

## 2018-08-13 MED ORDER — PROPYLTHIOURACIL 50 MG PO TABS: 200.0000 mg | ORAL_TABLET | Freq: Three times a day (TID) | ORAL | 3 refills | Status: DC

## 2018-08-13 MED ORDER — NEOMYCIN-POLYMYXIN-HC 1 % OT SOLN: 3.0000 [drp] | Freq: Four times a day (QID) | OTIC | 0 refills | Status: AC

## 2018-08-13 MED FILL — NEO/POLYMYXIN/HC EAR SOLN: 3.5-10000-1 | 12 days supply | Qty: 10 | Fill #0

## 2018-08-13 MED FILL — AMOX-CLAV 875-125 MG TABLET: 875-125 | 7 days supply | Qty: 14 | Fill #0

## 2018-08-13 NOTE — Patient Instructions (Signed)
Hydrocortisone; Neomycin; Polymyxin B ear solution What is this medicine? HYDROCORTISONE; NEOMYCIN; and POLYMYXIN B (hye droe KOR ti sone; nee oh MYE sin; pol i MIX in B) is used to treat ear infections. This medicine may be used for other purposes; ask your health care provider or pharmacist if you have questions. COMMON BRAND NAME(S): AK-Spore HC, AK-Spore HC Otic, Antibiotic Otic, Cortisporin, Cortomycin, Oti-Sone, Oticin HC, Otimar, Otocidin What should I tell my health care provider before I take this medicine? They need to know if you have any of these conditions: -any other active infections -chronic ear infections or fluid in the ear -perforated ear drum -an unusual or allergic reaction to hydrocortisone, neomycin, polymyxin B, sulfites, other medicines, foods, dyes, or preservatives -pregnant or trying to get pregnant -breast-feeding How should I use this medicine? This medicine is only for use in the ears. Follow the directions on the prescription label. Wash hands before and after use. Clean your ear of any fluid that can be easily removed. Do not insert any object or swab into the ear canal. Gently warm the bottle by holding it in the hand for 1 to 2 minutes. Lie down on your side with the infected ear facing upward. Try not to touch the tip of the dropper to your ear, fingertips, or other surface. Squeeze the bottle gently to put the prescribed number of drops in the ear canal. Stay in this position for 30 to 60 seconds to help the drops soak into the ear. Repeat the steps for the other ear if both ears are infected. Do not use your medicine more often than directed. Finish the full course of medicine prescribed by your doctor or health care professional even if you think your condition is better. Talk to your pediatrician regarding the use of this medicine in children. While this drug may be prescribed for selected conditions, precautions do apply. Overdosage: If you think you have  taken too much of this medicine contact a poison control center or emergency room at once. NOTE: This medicine is only for you. Do not share this medicine with others. What if I miss a dose? If you miss a dose, use it as soon as you can. If it is almost time for your next dose, use only that dose. Do not take double or extra doses. What may interact with this medicine? Interactions are not expected. Do not use other ear products without talking to your doctor or health care professional. This list may not describe all possible interactions. Give your health care provider a list of all the medicines, herbs, non-prescription drugs, or dietary supplements you use. Also tell them if you smoke, drink alcohol, or use illegal drugs. Some items may interact with your medicine. What should I watch for while using this medicine? Tell your doctor or health care professional if your ear infection does not get better in a few days. Do not use longer than 10 days unless instructed by your doctor or health care professional. If rash or allergic reaction occurs, stop the product immediately and contact your physician. It is important that you keep the infected ear(s) clean and dry. When bathing, try not to get the infected ear(s) wet. Do not go swimming unless your doctor or health care professional has told you otherwise. To prevent the spread of infection, do not share ear products, or share towels and washcloths with anyone else. What side effects may I notice from receiving this medicine? Side effects that you should  report to your doctor or health care professional as soon as possible: -rash -red, itchy, dry scaly skin at the affected site -worsening ear pain Side effects that usually do not require medical attention (report to your doctor or health care professional if they continue or are bothersome): -abnormal sensation in the ear -burning or stinging while putting the drops in the ear This list may not  describe all possible side effects. Call your doctor for medical advice about side effects. You may report side effects to FDA at 1-800-FDA-1088. Where should I keep my medicine? Keep out of the reach of children. Store at room temperature between 15 and 25 degrees C (59 and 77 degrees F). Do not freeze. Throw away any unused medicine after the expiration date. NOTE: This sheet is a summary. It may not cover all possible information. If you have questions about this medicine, talk to your doctor, pharmacist, or health care provider.  2019 Elsevier/Gold Standard (2014-08-12 15:13:48) Amoxicillin; Clavulanic Acid oral suspension What is this medicine? AMOXICILLIN; CLAVULANIC ACID (a mox i SILL in; KLAV yoo lan ic AS id) is a penicillin antibiotic. It is used to treat certain kinds of bacterial infections. It will not work for colds, flu, or other viral infections. This medicine may be used for other purposes; ask your health care provider or pharmacist if you have questions. COMMON BRAND NAME(S): Amoclan, Augmentin, Augmentin ES What should I tell my health care provider before I take this medicine? They need to know if you have any of these conditions: -bowel disease, like colitis -kidney disease -liver disease -mononucleosis -phenylketonuria -an unusual or allergic reaction to amoxicillin, penicillin, cephalosporin, other antibiotics, clavulanic acid, other medicines, foods, dyes, or preservatives -pregnant or trying to get pregnant -breast-feeding How should I use this medicine? Take this medicine by mouth just before a meal or snack. Follow the directions on the prescription label. Shake well before using. Use a specially marked spoon or container to measure your medicine. Ask your pharmacist if you do not have one. Household spoons are not accurate. Bottles of suspension may contain more liquid than you need to take. Follow your doctor's instructions about how much to take and for how many  days to take it. Do not take more medicine than directed. But, finish all the medicine that is prescribed even if you think you are better. Talk to your pediatrician regarding the use of this medicine in children. While this drug may be prescribed for children as young as newborns for selected conditions, precautions do apply. Overdosage: If you think you have taken too much of this medicine contact a poison control center or emergency room at once. NOTE: This medicine is only for you. Do not share this medicine with others. What if I miss a dose? If you miss a dose, take it as soon as you can. If it is almost time for your next dose, take only that dose. Do not take double or extra doses. What may interact with this medicine? -allopurinol -anticoagulants -birth control pills -methotrexate -probenecid This list may not describe all possible interactions. Give your health care provider a list of all the medicines, herbs, non-prescription drugs, or dietary supplements you use. Also tell them if you smoke, drink alcohol, or use illegal drugs. Some items may interact with your medicine. What should I watch for while using this medicine? Tell your doctor or health care professional if your symptoms do not improve. Do not treat diarrhea with over the counter products. Contact  your doctor if you have diarrhea that lasts more than 2 days or if it is severe and watery. If you have diabetes, you may get a false-positive result for sugar in your urine. Check with your doctor or health care professional. Birth control pills may not work properly while you are taking this medicine. Talk to your doctor about using an extra method of birth control. What side effects may I notice from receiving this medicine? Side effects that you should report to your doctor or health care professional as soon as possible: -allergic reactions like skin rash, itching or hives, swelling of the face, lips, or tongue -breathing  problems -dark urine -fever or chills, sore throat -redness, blistering, peeling or loosening of the skin, including inside the mouth -seizures -trouble passing urine or change in the amount of urine -unusual bleeding, bruising -unusually weak or tired -white patches or sores in the mouth or throat Side effects that usually do not require medical attention (report to your doctor or health care professional if they continue or are bothersome): -diarrhea -dizziness -headache -nausea, vomiting -stomach upset -vaginal or anal irritation This list may not describe all possible side effects. Call your doctor for medical advice about side effects. You may report side effects to FDA at 1-800-FDA-1088. Where should I keep my medicine? Keep out of the reach of children. After this medicine is mixed by your pharmacist, store it in a refrigerator. Do not freeze. Throw away any unused medicine after 10 days. NOTE: This sheet is a summary. It may not cover all possible information. If you have questions about this medicine, talk to your doctor, pharmacist, or health care provider.  2019 Elsevier/Gold Standard (2012-07-18 12:08:42) Otitis Media, Adult  Otitis media means that the middle ear is red and swollen (inflamed) and full of fluid. The condition usually goes away on its own. Follow these instructions at home:  Take over-the-counter and prescription medicines only as told by your doctor.  If you were prescribed an antibiotic medicine, take it as told by your doctor. Do not stop taking the antibiotic even if you start to feel better.  Keep all follow-up visits as told by your doctor. This is important. Contact a doctor if:  You have bleeding from your nose.  There is a lump on your neck.  You are not getting better in 5 days.  You feel worse instead of better. Get help right away if:  You have pain that is not helped with medicine.  You have swelling, redness, or pain around your  ear.  You get a stiff neck.  You cannot move part of your face (paralyzed).  You notice that the bone behind your ear hurts when you touch it.  You get a very bad headache. Summary  Otitis media means that the middle ear is red, swollen, and full of fluid.  This condition usually goes away on its own. In some cases, treatment may be needed.  If you were prescribed an antibiotic medicine, take it as told by your doctor. This information is not intended to replace advice given to you by your health care provider. Make sure you discuss any questions you have with your health care provider. Document Released: 08/10/2007 Document Revised: 03/14/2016 Document Reviewed: 03/14/2016 Elsevier Interactive Patient Education  2019 [employer]

## 2018-08-13 NOTE — Progress Notes (Signed)
[facility]  Sick Visit  Subjective:  Patient ID: Jennifer Stanton, female    DOB: 1960-04-05  Age: 58 y.o. MRN: [id number]  CC:  Chief Complaint  Patient presents with  . Ear Pain    right ear pain and drainage x 2 weeks     HPI Jennifer Stanton is a 58 year old female who presents for Sick Visit.   Past Medical History:  Diagnosis Date  . Blurry vision 06/20/2014  . Cephalalgia 06/20/2014  . Heart palpitations   . History of seizure 06/20/2014  . Otitis media   . Right ear pain   . Seizures ([place])   . Thyroid disease    Current Status: Since her last office visit, she is doing well with no complaints.  She has been having pain, discomfort, and drainage in her right ear X 2 weeks. She has used peroxide and Motrin with no relief. She rates her ear pain at 9/10 today. She denies fevers, chills, fatigue, recent infections, weight loss, and night sweats. She has not had any headaches, visual changes, dizziness, and falls. No chest pain, heart palpitations, cough and shortness of breath reported. No reports of GI problems such as nausea, vomiting, diarrhea, and constipation. She has no reports of blood in stools, dysuria and hematuria. No depression or anxiety reported.   Past Surgical History:  Procedure Laterality Date  . INNER EAR SURGERY  at age 70  . TUBAL LIGATION  Jul 05, 1991    Family History  Problem Relation Age of Onset  . Cancer Mother   . COPD Mother   . Breast cancer Mother 23  . Heart disease Father   . Hemochromatosis Brother     Social History   Socioeconomic History  . Marital status: Divorced    Spouse name: Not on file  . Number of children: Not on file  . Years of education: Not on file  . Highest education level: Not on file  Occupational History  . Not on file  Social Needs  . Financial resource strain: Not on file  . Food insecurity:    Worry: Not on file    Inability: Not on file  .  Transportation needs:    Medical: Not on file    Non-medical: Not on file  Tobacco Use  . Smoking status: Current Every Day Smoker    Packs/day: 0.10    Types: Cigarettes  . Smokeless tobacco: Never Used  Substance and Sexual Activity  . Alcohol use: Yes    Comment: occ  . Drug use: Yes    Types: Marijuana    Comment: today   . Sexual activity: Yes    Birth control/protection: Post-menopausal  Lifestyle  . Physical activity:    Days per week: 2 days    Minutes per session: 50 min  . Stress: Very much  Relationships  . Social connections:    Talks on phone: More than three times a week    Gets together: Once a week    Attends religious service: Never    Active member of club or organization: No    Attends meetings of clubs or organizations: Never    Relationship status: Divorced  . Intimate partner violence:    Fear of current or ex partner: No    Emotionally abused: No    Physically abused: No    Forced sexual activity: No  Other Topics Concern  . Not on file  Social History Narrative  . Not on file    Outpatient Medications Prior to Visit  Medication Sig Dispense Refill  . albuterol (PROVENTIL HFA;VENTOLIN HFA) 108 (90 Base) MCG/ACT inhaler Inhale 2 puffs into the lungs every 4 (four) hours as needed for wheezing or shortness of breath (cough, shortness of breath or wheezing.). 1 Inhaler 11  . amitriptyline (ELAVIL) 25 MG tablet Take 1 tablet (25 mg total) by mouth at bedtime. 30 tablet 3  . fluticasone furoate-vilanterol (BREO ELLIPTA) 100-25 MCG/INH AEPB Inhale 1 puff into the lungs daily. 60 each 1  . metoprolol tartrate (LOPRESSOR) 25 MG tablet Take 2 tablets (50 mg) once daily in AM. Take 1 tablet  (25 mg) once daily in the evening. 90 tablet 3  . traZODone (DESYREL) 50 MG tablet Take 1-2 tablets (50-100 mg total) by mouth at bedtime as needed for sleep. 60 tablet 1  . propylthiouracil (PTU) 50 MG tablet Take 4 tablets (200 mg total) by mouth 3 (three) times  daily. 360 tablet 3   No facility-administered medications prior to visit.     Allergies  Allergen Reactions  . Codeine Rash    ROS Review of Systems  Constitutional: Negative.   HENT: Positive for ear discharge and ear pain (right ear pain).        Right ear swelling.   Eyes: Negative.   Respiratory: Negative.   Cardiovascular: Negative.   Gastrointestinal: Negative.   Endocrine: Negative.   Genitourinary: Negative.   Musculoskeletal: Negative.   Skin: Negative.   Allergic/Immunologic: Negative.   Neurological: Negative.   Hematological: Negative.   Psychiatric/Behavioral: Negative.    Objective:    Physical Exam  Constitutional: She is oriented to person, place, and time. She appears well-developed and well-nourished.  HENT:  Right ear erythema and, painful, swollen canal.   Eyes: Conjunctivae are normal.  Neck: Normal range of motion. Neck supple.  Cardiovascular: Normal rate, regular rhythm, normal heart sounds and intact distal pulses.  Pulmonary/Chest: Effort normal and breath sounds normal.  Abdominal: Soft. Bowel sounds are normal.  Musculoskeletal: Normal range of motion.  Neurological: She is alert and oriented to person, place, and time. She has normal reflexes.  Skin: Skin is warm and dry.  Psychiatric: She has a normal mood and affect. Her behavior is normal. Judgment and thought content normal.  Nursing note and vitals reviewed.   BP 121/76 (BP Location: Right Arm, Patient Position: Sitting, Cuff Size: Normal)   Pulse 80   Temp 98.3 F (36.8 C) (Oral)   Resp 16   Ht 5\' 7"  (1.702 m)   Wt 126 lb (57.2 kg)   SpO2 99%   BMI 19.73 kg/m  Wt Readings from Last 3 Encounters:  08/13/18 126 lb (57.2 kg)  04/17/18 113 lb (51.3 kg)  03/06/18 107 lb 12.8 oz (48.9 kg)     Health Maintenance Due  Topic Date Due  . Hepatitis C Screening  [birth year]  . HIV Screening  01/05/1978  . COLONOSCOPY  01/05/2013    There are no preventive care reminders to  display for this patient.  Lab Results  Component Value Date   TSH <0.006 (L) 02/13/2018   Lab Results  Component Value Date   WBC 6.0 09/12/2017   HGB 14.9 09/12/2017   HCT 45.3 09/12/2017   MCV 89 09/12/2017   PLT 272 09/12/2017   Lab Results  Component Value Date   NA 140 09/12/2017   K 4.3 09/12/2017   CO2 25 09/12/2017  GLUCOSE 83 09/12/2017   BUN 8 09/12/2017   CREATININE 0.73 09/12/2017   BILITOT 0.4 09/12/2017   ALKPHOS 136 (H) 09/12/2017   AST 44 (H) 09/12/2017   ALT 71 (H) 09/12/2017   PROT 7.3 09/12/2017   ALBUMIN 4.4 09/12/2017   CALCIUM 9.5 09/12/2017   ANIONGAP 7 02/14/2017   No results found for: CHOL No results found for: HDL No results found for: LDLCALC No results found for: TRIG No results found for: [facility] Associates[facility]OLHDL Lab Results  Component Value Date   HGBA1C 4.9 04/17/2018   Assessment & Plan:   1. Right otitis media, unspecified otitis media type Ear is very inflamed and painful to touch. We will initiate systemic antibiotic and Otic drops today.  - amoxicillin-clavulanate (AUGMENTIN) 875-125 MG tablet; Take 1 tablet by mouth 2 (two) times daily for 7 days.  Dispense: 14 tablet; Refill: 0 - NEOMYCIN-POLYMYXIN-HYDROCORTISONE (CORTISPORIN) 1 % SOLN OTIC solution; Place 3 drops into the right ear 4 (four) times daily for 7 days.  Dispense: 4.2 mL; Refill: 0  2. Ear pain, right  3. Hyperthyroidism Stable.  - propylthiouracil (PTU) 50 MG tablet; Take 4 tablets (200 mg total) by mouth 3 (three) times daily.  Dispense: 360 tablet; Refill: 3  4. Palpitations No reports today.  - propylthiouracil (PTU) 50 MG tablet; Take 4 tablets (200 mg total) by mouth 3 (three) times daily.  Dispense: 360 tablet; Refill: 3  5. Follow up She will keep follow up appointment 10/2018.    Problem List Items Addressed This Visit    None    Visit Diagnoses    Right otitis media, unspecified otitis media type    -  Primary   Relevant Medications    amoxicillin-clavulanate (AUGMENTIN) 875-125 MG tablet   NEOMYCIN-POLYMYXIN-HYDROCORTISONE (CORTISPORIN) 1 % SOLN OTIC solution   Ear pain, right       Hyperthyroidism       Relevant Medications   propylthiouracil (PTU) 50 MG tablet   Palpitations       Relevant Medications   propylthiouracil (PTU) 50 MG tablet   Follow up          Meds ordered this encounter  Medications  . propylthiouracil (PTU) 50 MG tablet    Sig: Take 4 tablets (200 mg total) by mouth 3 (three) times daily.    Dispense:  360 tablet    Refill:  3    Medication dose change  . amoxicillin-clavulanate (AUGMENTIN) 875-125 MG tablet    Sig: Take 1 tablet by mouth 2 (two) times daily for 7 days.    Dispense:  14 tablet    Refill:  0  . NEOMYCIN-POLYMYXIN-HYDROCORTISONE (CORTISPORIN) 1 % SOLN OTIC solution    Sig: Place 3 drops into the right ear 4 (four) times daily for 7 days.    Dispense:  4.2 mL    Refill:  0    Follow-up: No follow-ups on file.    Kallie LocksNatalie M Cleave Ternes, FNP

## 2018-09-06 MED FILL — ?METOPROLOL 25 MG TABLET: 25 | 30 days supply | Qty: 90 | Fill #2

## 2018-09-06 MED FILL — PROPYLTHIOURACIL 50 MG TABS: 50 | 30 days supply | Qty: 360 | Fill #2

## 2018-09-06 MED FILL — ?AMITRIPTYLINE HCL 25 MG TA: 25 | 30 days supply | Qty: 30 | Fill #1

## 2018-09-06 MED FILL — traZODone HCL 50 MG TABS: 50 | 30 days supply | Qty: 60 | Fill #1

## 2018-09-11 ENCOUNTER — Ambulatory Visit: Payer: No Typology Code available for payment source | Admitting: Family Medicine

## 2018-09-25 ENCOUNTER — Other Ambulatory Visit: Payer: Self-pay | Admitting: Family Medicine

## 2018-09-25 MED FILL — ?METOPROLOL 25 MG TABLET: 25 | 30 days supply | Qty: 90 | Fill #3

## 2018-09-25 MED FILL — ?AMITRIPTYLINE HCL 25 MG TA: 25 | 30 days supply | Qty: 30 | Fill #2

## 2018-10-16 ENCOUNTER — Ambulatory Visit (INDEPENDENT_AMBULATORY_CARE_PROVIDER_SITE_OTHER): Payer: Self-pay | Admitting: Family Medicine

## 2018-10-16 ENCOUNTER — Encounter: Payer: Self-pay | Admitting: Family Medicine

## 2018-10-16 ENCOUNTER — Other Ambulatory Visit: Payer: Self-pay

## 2018-10-16 VITALS — BP 110/60 | HR 84 | Temp 97.8°F | Ht 67.0 in | Wt 137.0 lb

## 2018-10-16 DIAGNOSIS — R002 Palpitations: Secondary | ICD-10-CM

## 2018-10-16 DIAGNOSIS — R635 Abnormal weight gain: Secondary | ICD-10-CM | POA: Insufficient documentation

## 2018-10-16 DIAGNOSIS — E039 Hypothyroidism, unspecified: Secondary | ICD-10-CM | POA: Insufficient documentation

## 2018-10-16 DIAGNOSIS — G4709 Other insomnia: Secondary | ICD-10-CM | POA: Insufficient documentation

## 2018-10-16 DIAGNOSIS — Z09 Encounter for follow-up examination after completed treatment for conditions other than malignant neoplasm: Secondary | ICD-10-CM

## 2018-10-16 LAB — POCT URINALYSIS DIP (MANUAL ENTRY)
Bilirubin, UA: NEGATIVE
Blood, UA: NEGATIVE
Glucose, UA: NEGATIVE mg/dL
Ketones, POC UA: NEGATIVE mg/dL
Leukocytes, UA: NEGATIVE
Nitrite, UA: NEGATIVE
Protein Ur, POC: NEGATIVE mg/dL
Spec Grav, UA: >=1.03 — AB (ref 1.010–1.025)
Urobilinogen, UA: 0.2 E.U./dL
pH, UA: 6 (ref 5.0–8.0)

## 2018-10-16 NOTE — Progress Notes (Signed)
[facility]   Established Patient Office Visit  Subjective:  Patient ID: Jennifer Stanton, female    DOB: 08/25/1960  Age: 58 y.o. MRN: [id number]  CC:  Chief Complaint  Patient presents with  . Follow-up    chronic condition     HPI Jennifer Stanton is a 58 year old female who presents for follow up today.   Past Medical History:  Diagnosis Date  . Blurry vision 06/20/2014  . Cephalalgia 06/20/2014  . Heart palpitations   . History of seizure 06/20/2014  . Otitis media   . Right ear pain   . Seizures (HCC)   . Thyroid disease    Current Status: Since her last office visit, she is doing well with no complaints. She has recently has a weight gain, which she is excited about today. She has a 31 lb weight gain since 02/2018. She continues to take Thyroid medication as prescribed. She denies fatigue, inability to tolerate cold, unexplained weight loss, dry skin, heart palpitations, chest pain, coarse hair, decrease thought process, constipation, depression. She denies suicidal ideations, homicidal ideations, or auditory hallucinations.  She denies fevers, chills, fatigue, recent infections, weight loss, and night sweats. She has not had any headaches, visual changes, dizziness, and falls. No cough and shortness of breath reported. No reports of GI problems such as nausea, vomiting, and diarrhea. She has no reports of blood in stools, dysuria and hematuria. She denies pain today.   Past Surgical History:  Procedure Laterality Date  . INNER EAR SURGERY  at age 58  . TUBAL LIGATION  Jul 05, 1991    Family History  Problem Relation Age of Onset  . Cancer Mother   . COPD Mother   . Breast cancer Mother [id number]  . Heart disease Father   . Hemochromatosis Brother     Social History   Socioeconomic History  . Marital status: Divorced    Spouse name: Not on file  . Number of children: Not on file  . Years of education: Not on file  .  Highest education level: Not on file  Occupational History  . Not on file  Social Needs  . Financial resource strain: Not on file  . Food insecurity    Worry: Not on file    Inability: Not on file  . Transportation needs    Medical: Not on file    Non-medical: Not on file  Tobacco Use  . Smoking status: Current Every Day Smoker    Packs/day: 0.10    Types: Cigarettes  . Smokeless tobacco: Never Used  Substance and Sexual Activity  . Alcohol use: Yes    Comment: occ  . Drug use: Yes    Types: Marijuana    Comment: today   . Sexual activity: Yes    Birth control/protection: Post-menopausal  Lifestyle  . Physical activity    Days per week: 2 days    Minutes per session: 50 min  . Stress: Very much  Relationships  . Social connections    Talks on phone: More than three times a week    Gets together: Once a week    Attends religious service: Never    Active member of club or organization: No    Attends meetings of clubs or organizations: Never    Relationship status: Divorced  . Intimate partner violence    Fear of current or ex partner: No    Emotionally abused: No  Physically abused: No    Forced sexual activity: No  Other Topics Concern  . Not on file  Social History Narrative  . Not on file    Outpatient Medications Prior to Visit  Medication Sig Dispense Refill  . albuterol (PROVENTIL HFA;VENTOLIN HFA) 108 (90 Base) MCG/ACT inhaler Inhale 2 puffs into the lungs every 4 (four) hours as needed for wheezing or shortness of breath (cough, shortness of breath or wheezing.). 1 Inhaler 11  . amitriptyline (ELAVIL) 25 MG tablet Take 1 tablet (25 mg total) by mouth at bedtime. 30 tablet 3  . fluticasone furoate-vilanterol (BREO ELLIPTA) 100-25 MCG/INH AEPB Inhale 1 puff into the lungs daily. 60 each 1  . metoprolol tartrate (LOPRESSOR) 25 MG tablet Take 2 tablets (50 mg) once daily in AM. Take 1 tablet  (25 mg) once daily in the evening. 90 tablet 3  . propylthiouracil  (PTU) 50 MG tablet Take 4 tablets (200 mg total) by mouth 3 (three) times daily. 360 tablet 3  . traZODone (DESYREL) 50 MG tablet TAKE 1-2 TABLETS (50-100 MG TOTAL) BY MOUTH AT BEDTIME AS NEEDED FOR SLEEP. 60 tablet 1   No facility-administered medications prior to visit.     Allergies  Allergen Reactions  . Codeine Rash    ROS Review of Systems  Constitutional: Negative.   HENT: Negative.   Eyes: Negative.   Respiratory: Negative.   Cardiovascular: Positive for palpitations (occasional).  Gastrointestinal: Negative.   Endocrine: Negative.   Genitourinary: Negative.   Musculoskeletal: Negative.   Skin: Negative.   Allergic/Immunologic: Negative.   Neurological: Negative.   Hematological: Negative.   Psychiatric/Behavioral: Negative.       Objective:    Physical Exam  Constitutional: She is oriented to person, place, and time. She appears well-developed and well-nourished.  HENT:  Head: Atraumatic.  Eyes: Conjunctivae are normal.  Neck: Normal range of motion. Neck supple.  Cardiovascular: Normal rate, regular rhythm, normal heart sounds and intact distal pulses.  Pulmonary/Chest: Effort normal and breath sounds normal.  Abdominal: Soft. Bowel sounds are normal.  Musculoskeletal: Normal range of motion.  Neurological: She is alert and oriented to person, place, and time. She has normal reflexes.  Skin: Skin is warm and dry.  Psychiatric: She has a normal mood and affect. Her behavior is normal. Judgment and thought content normal.  Nursing note and vitals reviewed.   Ht 5\' 7"  (1.702 m)   Wt 137 lb (62.1 kg)   BMI 21.46 kg/m  Wt Readings from Last 3 Encounters:  10/16/18 137 lb (62.1 kg)  08/13/18 126 lb (57.2 kg)  04/17/18 113 lb (51.3 kg)     Health Maintenance Due  Topic Date Due  . Hepatitis C Screening  16-May-1960  . HIV Screening  01/05/1978  . COLONOSCOPY  01/05/2013  . INFLUENZA VACCINE  10/06/2018    There are no preventive care reminders to  display for this patient.  Lab Results  Component Value Date   TSH <0.006 (L) 02/13/2018   Lab Results  Component Value Date   WBC 6.0 09/12/2017   HGB 14.9 09/12/2017   HCT 45.3 09/12/2017   MCV 89 09/12/2017   PLT 272 09/12/2017   Lab Results  Component Value Date   NA 140 09/12/2017   K 4.3 09/12/2017   CO2 25 09/12/2017   GLUCOSE 83 09/12/2017   BUN 8 09/12/2017   CREATININE 0.73 09/12/2017   BILITOT 0.4 09/12/2017   ALKPHOS 136 (H) 09/12/2017   AST 44 (H)  09/12/2017   ALT 71 (H) 09/12/2017   PROT 7.3 09/12/2017   ALBUMIN 4.4 09/12/2017   CALCIUM 9.5 09/12/2017   ANIONGAP 7 02/14/2017   No results found for: CHOL No results found for: HDL No results found for: LDLCALC No results found for: TRIG No results found for: [facility] Lab Results  Component Value Date   HGBA1C 4.9 04/17/2018   Assessment & Plan:   1. Hypothyroidism, unspecified type Stable today. We will continue to monitor. - Thyroid Panel With TSH - Comprehensive metabolic panel - CBC with Differential - Lipid Panel - Vitamin B12 - Vitamin D, 25-hydroxy  2. Recent weight gain She has gained 31 lbs since 02/2018.   3. Palpitations Stable.   4. Other insomnia Trazodone is effective. Continue as prescribed.   5. Follow up She will follow up in 6 months.  - POCT urinalysis dipstick  No orders of the defined types were placed in this encounter.  Orders Placed This Encounter  Procedures  . Thyroid Panel With TSH  . Comprehensive metabolic panel  . CBC with Differential  . Lipid Panel  . Vitamin B12  . Vitamin D, 25-hydroxy  . POCT urinalysis dipstick    Referral Orders  No referral(s) requested today    Raliegh IpNatalie Albena Comes,  MSN, FNP-BC [facility] [address]  [place], [place] [address] [phone]  Problem List Items Addressed This Visit    None    Visit Diagnoses    Follow up    -   Primary   Relevant Orders   POCT urinalysis dipstick      No orders of the defined types were placed in this encounter.   Follow-up: No follow-ups on file.    Kallie LocksNatalie M Joelyn Lover, FNP

## 2018-10-17 LAB — LIPID PANEL
Chol/HDL Ratio: 3.4 ratio (ref 0.0–4.4)
Cholesterol, Total: 193 mg/dL (ref 100–199)
HDL: 56 mg/dL (ref >39)
LDL Calculated: 118 mg/dL — ABNORMAL HIGH (ref 0–99)
Triglycerides: 93 mg/dL (ref 0–149)
VLDL Cholesterol Cal: 19 mg/dL (ref 5–40)

## 2018-10-17 LAB — CBC WITH DIFFERENTIAL/PLATELET
Basophils Absolute: 0 10*3/uL (ref 0.0–0.2)
Basos: 0 %
EOS (ABSOLUTE): 0 10*3/uL (ref 0.0–0.4)
Eos: 0 %
Hematocrit: 44.7 % (ref 34.0–46.6)
Hemoglobin: 15.4 g/dL (ref 11.1–15.9)
Immature Grans (Abs): 0 10*3/uL (ref 0.0–0.1)
Immature Granulocytes: 0 %
Lymphocytes Absolute: 2.1 10*3/uL (ref 0.7–3.1)
Lymphs: 46 %
MCH: 30.6 pg (ref 26.6–33.0)
MCHC: 34.5 g/dL (ref 31.5–35.7)
MCV: 89 fL (ref 79–97)
Monocytes Absolute: 0.4 10*3/uL (ref 0.1–0.9)
Monocytes: 8 %
Neutrophils Absolute: 2.1 10*3/uL (ref 1.4–7.0)
Neutrophils: 46 %
Platelets: 264 10*3/uL (ref 150–450)
RBC: 5.04 x10E6/uL (ref 3.77–5.28)
RDW: 12.3 % (ref 11.7–15.4)
WBC: 4.6 10*3/uL (ref 3.4–10.8)

## 2018-10-17 LAB — COMPREHENSIVE METABOLIC PANEL
ALT: 60 IU/L — ABNORMAL HIGH (ref 0–32)
AST: 50 IU/L — ABNORMAL HIGH (ref 0–40)
Albumin/Globulin Ratio: 1.5 (ref 1.2–2.2)
Albumin: 4.3 g/dL (ref 3.8–4.9)
Alkaline Phosphatase: 116 IU/L (ref 39–117)
BUN/Creatinine Ratio: 16 (ref 9–23)
BUN: 11 mg/dL (ref 6–24)
Bilirubin Total: 0.4 mg/dL (ref 0.0–1.2)
CO2: 22 mmol/L (ref 20–29)
Calcium: 9 mg/dL (ref 8.7–10.2)
Chloride: 101 mmol/L (ref 96–106)
Creatinine, Ser: 0.68 mg/dL (ref 0.57–1.00)
GFR calc Af Amer: 114 mL/min/{1.73_m2} (ref >59)
GFR calc non Af Amer: 99 mL/min/{1.73_m2} (ref >59)
Globulin, Total: 2.8 g/dL (ref 1.5–4.5)
Glucose: 106 mg/dL — ABNORMAL HIGH (ref 65–99)
Potassium: 4.5 mmol/L (ref 3.5–5.2)
Sodium: 136 mmol/L (ref 134–144)
Total Protein: 7.1 g/dL (ref 6.0–8.5)

## 2018-10-17 LAB — THYROID PANEL WITH TSH
Free Thyroxine Index: 1 — ABNORMAL LOW (ref 1.2–4.9)
T3 Uptake Ratio: 21 % — ABNORMAL LOW (ref 24–39)
T4, Total: 4.7 ug/dL (ref 4.5–12.0)
TSH: 2.04 u[IU]/mL (ref 0.450–4.500)

## 2018-10-17 LAB — VITAMIN B12: Vitamin B-12: 1210 pg/mL (ref 232–1245)

## 2018-10-17 LAB — VITAMIN D 25 HYDROXY (VIT D DEFICIENCY, FRACTURES): Vit D, 25-Hydroxy: 51 ng/mL (ref 30.0–100.0)

## 2018-11-13 MED FILL — PROPYLTHIOURACIL 50 MG TABS: 50 | 30 days supply | Qty: 360 | Fill #3

## 2018-11-13 MED FILL — ?AMITRIPTYLINE HCL 25 MG TA: 25 | 30 days supply | Qty: 30 | Fill #3

## 2018-11-13 MED FILL — traZODone HCL 50 MG TABS: 50 | 30 days supply | Qty: 60 | Fill #0

## 2018-11-13 MED FILL — METOPROLOL TARTRATE 25 MG T: 25 | 30 days supply | Qty: 90 | Fill #3

## 2018-12-20 ENCOUNTER — Other Ambulatory Visit: Payer: Self-pay

## 2018-12-20 DIAGNOSIS — I10 Essential (primary) hypertension: Secondary | ICD-10-CM

## 2018-12-20 MED ORDER — METOPROLOL TARTRATE 25 MG PO TABS: ORAL_TABLET | ORAL | 3 refills | Status: DC

## 2018-12-20 MED FILL — PROPYLTHIOURACIL 50 MG TABS: 50 | 30 days supply | Qty: 360 | Fill #3

## 2018-12-20 MED FILL — ?METOPROLOL 25 MG TABLET: 25 | 30 days supply | Qty: 90 | Fill #0

## 2018-12-20 MED FILL — ?AMITRIPTYLINE HCL 25 MG TA: 25 | 30 days supply | Qty: 30 | Fill #3

## 2019-01-23 ENCOUNTER — Other Ambulatory Visit: Payer: Self-pay | Admitting: Family Medicine

## 2019-01-23 DIAGNOSIS — G4709 Other insomnia: Secondary | ICD-10-CM

## 2019-01-23 MED FILL — ?METOPROLOL 25 MG TABLET: 25 | 30 days supply | Qty: 90 | Fill #1

## 2019-01-23 MED FILL — PROPYLTHIOURACIL 50 MG TABS: 50 | 30 days supply | Qty: 360 | Fill #0

## 2019-01-23 MED FILL — traZODone HCL 50 MG TABS: 50 | 30 days supply | Qty: 60 | Fill #1

## 2019-01-23 MED FILL — ?AMITRIPTYLINE HCL 25 MG TA: 25 | 30 days supply | Qty: 30 | Fill #0

## 2019-01-25 MED FILL — !VENTOLIN HFA INHALER: 108 (90 BAS | 30 days supply | Qty: 18 | Fill #1

## 2019-02-21 ENCOUNTER — Other Ambulatory Visit: Payer: Self-pay | Admitting: Family Medicine

## 2019-02-21 MED FILL — PROPYLTHIOURACIL 50 MG TABS: 50 | 30 days supply | Qty: 360 | Fill #1

## 2019-02-21 MED FILL — ?AMITRIPTYLINE HCL 25 MG TA: 25 | 30 days supply | Qty: 30 | Fill #1

## 2019-02-21 MED FILL — ?METOPROLOL 25 MG TABLET: 25 | 30 days supply | Qty: 90 | Fill #2

## 2019-02-21 MED FILL — ?traZODONE HCL 50MG TAB: 50 | 30 days supply | Qty: 60 | Fill #0

## 2019-02-22 ENCOUNTER — Other Ambulatory Visit: Payer: Self-pay

## 2019-03-26 MED FILL — PROPYLTHIOURACIL 50 MG TABS: 50 | 30 days supply | Qty: 360 | Fill #2

## 2019-03-26 MED FILL — ?METOPROLOL 25 MG TABLET: 25 | 30 days supply | Qty: 90 | Fill #3

## 2019-03-26 MED FILL — ?TRAZODONE HCL 50 TABS: 50 | 30 days supply | Qty: 60 | Fill #1

## 2019-03-26 MED FILL — ?AMITRIPTYLINE HCL 25 MG TA: 25 | 30 days supply | Qty: 30 | Fill #2

## 2019-04-08 DIAGNOSIS — F4321 Adjustment disorder with depressed mood: Secondary | ICD-10-CM

## 2019-04-08 DIAGNOSIS — F1290 Cannabis use, unspecified, uncomplicated: Secondary | ICD-10-CM

## 2019-04-08 HISTORY — DX: Adjustment disorder with depressed mood: F43.21

## 2019-04-08 HISTORY — DX: Cannabis use, unspecified, uncomplicated: F12.90

## 2019-04-19 ENCOUNTER — Encounter: Payer: Self-pay | Admitting: Family Medicine

## 2019-04-19 ENCOUNTER — Other Ambulatory Visit: Payer: Self-pay

## 2019-04-19 ENCOUNTER — Ambulatory Visit (INDEPENDENT_AMBULATORY_CARE_PROVIDER_SITE_OTHER): Payer: Self-pay | Admitting: Family Medicine

## 2019-04-19 VITALS — BP 115/72 | HR 87 | Temp 98.0°F | Ht 67.0 in | Wt 150.8 lb

## 2019-04-19 DIAGNOSIS — H9201 Otalgia, right ear: Secondary | ICD-10-CM

## 2019-04-19 DIAGNOSIS — F419 Anxiety disorder, unspecified: Secondary | ICD-10-CM

## 2019-04-19 DIAGNOSIS — G8929 Other chronic pain: Secondary | ICD-10-CM

## 2019-04-19 DIAGNOSIS — E0590 Thyrotoxicosis, unspecified without thyrotoxic crisis or storm: Secondary | ICD-10-CM

## 2019-04-19 DIAGNOSIS — F4321 Adjustment disorder with depressed mood: Secondary | ICD-10-CM

## 2019-04-19 DIAGNOSIS — Z0000 Encounter for general adult medical examination without abnormal findings: Secondary | ICD-10-CM

## 2019-04-19 DIAGNOSIS — Z09 Encounter for follow-up examination after completed treatment for conditions other than malignant neoplasm: Secondary | ICD-10-CM

## 2019-04-19 DIAGNOSIS — R002 Palpitations: Secondary | ICD-10-CM

## 2019-04-19 LAB — POCT URINALYSIS DIPSTICK
Bilirubin, UA: NEGATIVE
Blood, UA: NEGATIVE
Glucose, UA: NEGATIVE
Ketones, UA: NEGATIVE
Nitrite, UA: NEGATIVE
Protein, UA: NEGATIVE
Spec Grav, UA: 1.025 (ref 1.010–1.025)
Urobilinogen, UA: 0.2 E.U./dL
pH, UA: 7 (ref 5.0–8.0)

## 2019-04-19 LAB — POCT GLYCOSYLATED HEMOGLOBIN (HGB A1C): Hemoglobin A1C: 5.3 % (ref 4.0–5.6)

## 2019-04-19 LAB — GLUCOSE, POCT (MANUAL RESULT ENTRY): POC Glucose: 116 mg/dl — AB (ref 70–99)

## 2019-04-19 NOTE — Progress Notes (Signed)
[facility]   Established Patient Office Visit  Subjective:  Patient ID: Jennifer Stanton, female    DOB: [birth year]  Age: 59 y.o. MRN: [id number]  CC:  Chief Complaint  Patient presents with  . Follow-up    hypothyroid   . Ear Pain    fluid filled    HPI Jennifer Stanton is a 59 year old female who presents for Follow Up today.   Past Medical History:  Diagnosis Date  . Blurry vision 06/20/2014  . Cephalalgia 06/20/2014  . Grieving 04/2019  . Heart palpitations   . History of seizure 06/20/2014  . Marijuana use 04/2019  . Otitis media   . Right ear pain   . Seizures (HCC)   . Thyroid disease    Current Status: Since her last office visit, she has c/o right ear pain. She states that she has had ear pain X one month. She occasionally pain, but states that she has fluid leakage almost every day. She has been using eardrops with minimal relief. She has not had a seizure in many years now. She continues to take Thyroid medication as prescribed. She has occasional heart palpitations. Her anxiety is moderate today. She denies suicidal ideations, homicidal ideations, or auditory hallucinations. She denies fevers, chills, fatigue, recent infections, weight loss, and night sweats. She reports occasional dizziness. She has not had any headaches, visual changes. No chest pain, cough and shortness of breath reported. No reports of GI problems such as nausea, vomiting, diarrhea, and constipation. She has no reports of blood in stools, dysuria and hematuria. She denies pain today.   Past Surgical History:  Procedure Laterality Date  . INNER EAR SURGERY  at age 17  . TUBAL LIGATION  Jul 05, 1991    Family History  Problem Relation Age of Onset  . Cancer Mother   . COPD Mother   . Breast cancer Mother 69  . Heart disease Father   . Hemochromatosis Brother     Social History   Socioeconomic History  . Marital status: Divorced      Spouse name: Not on file  . Number of children: Not on file  . Years of education: Not on file  . Highest education level: Not on file  Occupational History  . Not on file  Tobacco Use  . Smoking status: Current Every Day Smoker    Packs/day: 0.10    Types: Cigarettes  . Smokeless tobacco: Never Used  Substance and Sexual Activity  . Alcohol use: Yes    Comment: occ  . Drug use: Yes    Types: Marijuana    Comment: today   . Sexual activity: Not Currently    Birth control/protection: Post-menopausal  Other Topics Concern  . Not on file  Social History Narrative  . Not on file   Social Determinants of Health   Financial Resource Strain:   . Difficulty of Paying Living Expenses: Not on file  Food Insecurity:   . Worried About Programme researcher, broadcasting/film/video in the Last Year: Not on file  . Ran Out of Food in the Last Year: Not on file  Transportation Needs:   . Lack of Transportation (Medical): Not on file  . Lack of Transportation (Non-Medical): Not on file  Physical Activity:   . Days of Exercise per Week: Not on file  . Minutes of Exercise per Session: Not on file  Stress:   . Feeling of Stress :  Not on file  Social Connections:   . Frequency of Communication with Friends and Family: Not on file  . Frequency of Social Gatherings with Friends and Family: Not on file  . Attends Religious Services: Not on file  . Active Member of Clubs or Organizations: Not on file  . Attends Banker Meetings: Not on file  . Marital Status: Not on file  Intimate Partner Violence:   . Fear of Current or Ex-Partner: Not on file  . Emotionally Abused: Not on file  . Physically Abused: Not on file  . Sexually Abused: Not on file    Outpatient Medications Prior to Visit  Medication Sig Dispense Refill  . albuterol (PROVENTIL HFA;VENTOLIN HFA) 108 (90 Base) MCG/ACT inhaler Inhale 2 puffs into the lungs every 4 (four) hours as needed for wheezing or shortness of breath (cough,  shortness of breath or wheezing.). 1 Inhaler 11  . amitriptyline (ELAVIL) 25 MG tablet TAKE 1 TABLET (25 MG TOTAL) BY MOUTH AT BEDTIME. 30 tablet 3  . fluticasone furoate-vilanterol (BREO ELLIPTA) 100-25 MCG/INH AEPB Inhale 1 puff into the lungs daily. 60 each 1  . metoprolol tartrate (LOPRESSOR) 25 MG tablet Take 2 tablets (50 mg) once daily in AM. Take 1 tablet  (25 mg) once daily in the evening. 90 tablet 3  . propylthiouracil (PTU) 50 MG tablet Take 4 tablets (200 mg total) by mouth 3 (three) times daily. 360 tablet 3  . traZODone (DESYREL) 50 MG tablet TAKE 1-2 TABLETS (50-100 MG TOTAL) BY MOUTH AT BEDTIME AS NEEDED FOR SLEEP. 60 tablet 1   No facility-administered medications prior to visit.    Allergies  Allergen Reactions  . Codeine Rash    ROS Review of Systems  Constitutional: Negative.   HENT: Negative.   Eyes: Negative.   Respiratory: Negative.   Cardiovascular: Negative.   Gastrointestinal: Negative.   Endocrine: Negative.   Genitourinary: Negative.   Musculoskeletal: Positive for arthralgias (generalized ).  Skin: Negative.   Allergic/Immunologic: Negative.   Neurological: Positive for dizziness (occasional ) and headaches (occasional ).  Hematological: Negative.   Psychiatric/Behavioral: The patient is nervous/anxious and is hyperactive.        Tearful r/t recent death of dog and friend. Also the lost of her daughter.       Objective:    Physical Exam  Constitutional: She is oriented to person, place, and time. She appears well-developed and well-nourished.  HENT:  Head: Normocephalic and atraumatic.  Eyes: Conjunctivae are normal.  Cardiovascular: Normal rate, regular rhythm, normal heart sounds and intact distal pulses.  Pulmonary/Chest: Effort normal and breath sounds normal.  Abdominal: Soft. Bowel sounds are normal.  Musculoskeletal:        General: Normal range of motion.     Cervical back: Normal range of motion and neck supple.  Neurological:  She is alert and oriented to person, place, and time. She has normal reflexes.  Skin: Skin is warm and dry.  Psychiatric: Judgment and thought content normal.  tearful  Nursing note and vitals reviewed.  BP 115/72   Pulse 87   Temp 98 F (36.7 C) (Oral)   Ht 5\' 7"  (1.702 m)   Wt 150 lb 12.8 oz (68.4 kg)   SpO2 97%   BMI 23.62 kg/m  Wt Readings from Last 3 Encounters:  04/19/19 150 lb 12.8 oz (68.4 kg)  10/16/18 137 lb (62.1 kg)  08/13/18 126 lb (57.2 kg)     Health Maintenance Due  Topic Date  Due  . Hepatitis C Screening  31-Aug-1960  . HIV Screening  01/05/1978  . COLONOSCOPY  01/05/2013    There are no preventive care reminders to display for this patient.  Lab Results  Component Value Date   TSH 2.040 10/16/2018   Lab Results  Component Value Date   WBC 4.6 10/16/2018   HGB 15.4 10/16/2018   HCT 44.7 10/16/2018   MCV 89 10/16/2018   PLT 264 10/16/2018   Lab Results  Component Value Date   NA 136 10/16/2018   K 4.5 10/16/2018   CO2 22 10/16/2018   GLUCOSE 106 (H) 10/16/2018   BUN 11 10/16/2018   CREATININE 0.68 10/16/2018   BILITOT 0.4 10/16/2018   ALKPHOS 116 10/16/2018   AST 50 (H) 10/16/2018   ALT 60 (H) 10/16/2018   PROT 7.1 10/16/2018   ALBUMIN 4.3 10/16/2018   CALCIUM 9.0 10/16/2018   ANIONGAP 7 02/14/2017   Lab Results  Component Value Date   CHOL 193 10/16/2018   Lab Results  Component Value Date   HDL 56 10/16/2018   Lab Results  Component Value Date   LDLCALC 118 (H) 10/16/2018   Lab Results  Component Value Date   TRIG 93 10/16/2018   Lab Results  Component Value Date   CHOLHDL 3.4 10/16/2018   Lab Results  Component Value Date   HGBA1C 5.3 04/19/2019   Assessment & Plan:   1. Hyperthyroidism Stable. She will continue Propylthiouracil as prescribed.   2. Palpitations Occasional.  3. Chronic right ear pain - Ambulatory referral to ENT  4. Anxiety We will send medication for anxiety for grieving as needed.    5. Grieving  6. Health care maintenance - POCT glycosylated hemoglobin (Hb A1C) - POCT urinalysis dipstick - POCT glucose (manual entry)  7. Follow up She will follow up in 6 months.   No orders of the defined types were placed in this encounter.   Orders Placed This Encounter  Procedures  . Ambulatory referral to ENT  . POCT glycosylated hemoglobin (Hb A1C)  . POCT urinalysis dipstick  . POCT glucose (manual entry)     Referral Orders     Ambulatory referral to ENT   Kathe Becton,  MSN, FNP-BC [address], [place] [address] [phone] fax   Problem List Items Addressed This Visit      Endocrine   Hyperthyroidism - Primary     Other   Palpitations    Other Visit Diagnoses    Chronic right ear pain       Relevant Orders   Ambulatory referral to ENT   [place] care maintenance       Relevant Orders   POCT glycosylated hemoglobin (Hb A1C) (Completed)   POCT urinalysis dipstick (Completed)   POCT glucose (manual entry) (Completed)   Follow up          No orders of the defined types were placed in this encounter.   Follow-up: Return in about 6 months (around 10/17/2019).    Azzie Glatter, FNP

## 2019-04-22 DIAGNOSIS — F4321 Adjustment disorder with depressed mood: Secondary | ICD-10-CM | POA: Insufficient documentation

## 2019-05-06 ENCOUNTER — Other Ambulatory Visit: Payer: Self-pay | Admitting: Family Medicine

## 2019-05-06 DIAGNOSIS — I10 Essential (primary) hypertension: Secondary | ICD-10-CM

## 2019-05-06 MED FILL — METOPROLOL TARTRATE 25 MG T: 25 | 30 days supply | Qty: 90 | Fill #0

## 2019-05-06 MED FILL — ?AMITRIPTYLINE HCL 25 MG TA: 25 | 30 days supply | Qty: 30 | Fill #3

## 2019-05-06 MED FILL — PROPYLTHIOURACIL 50 MG TABS: 50 | 30 days supply | Qty: 360 | Fill #3

## 2019-05-09 ENCOUNTER — Telehealth: Payer: Self-pay | Admitting: Family Medicine

## 2019-05-09 MED FILL — ?TRAZODONE HCL 50 TABS: 50 | 30 days supply | Qty: 60 | Fill #0

## 2019-05-09 NOTE — Telephone Encounter (Signed)
Patient stated March is month of her Daughter passing. And you told her call back if she need medication.

## 2019-05-09 NOTE — Telephone Encounter (Signed)
Pt grieving loss of daughter. Pt stated she was told to call back when this happens for medication. Please call pt back.

## 2019-05-12 ENCOUNTER — Other Ambulatory Visit: Payer: Self-pay | Admitting: Family Medicine

## 2019-05-12 DIAGNOSIS — F419 Anxiety disorder, unspecified: Secondary | ICD-10-CM

## 2019-05-12 DIAGNOSIS — F4321 Adjustment disorder with depressed mood: Secondary | ICD-10-CM

## 2019-05-13 ENCOUNTER — Other Ambulatory Visit: Payer: Self-pay | Admitting: Family Medicine

## 2019-05-13 ENCOUNTER — Telehealth: Payer: Self-pay | Admitting: Family Medicine

## 2019-05-13 DIAGNOSIS — F4321 Adjustment disorder with depressed mood: Secondary | ICD-10-CM

## 2019-05-13 DIAGNOSIS — F419 Anxiety disorder, unspecified: Secondary | ICD-10-CM

## 2019-05-13 MED ORDER — ALPRAZOLAM 0.5 MG PO TABS: 0.5000 mg | ORAL_TABLET | Freq: Every evening | ORAL | 0 refills | Status: DC | PRN

## 2019-05-13 NOTE — Telephone Encounter (Signed)
She said, If you have to send it to an outside pharmacy [facility] on [address].

## 2019-06-11 ENCOUNTER — Other Ambulatory Visit: Payer: Self-pay | Admitting: Family Medicine

## 2019-06-11 DIAGNOSIS — E0590 Thyrotoxicosis, unspecified without thyrotoxic crisis or storm: Secondary | ICD-10-CM

## 2019-06-11 DIAGNOSIS — G4709 Other insomnia: Secondary | ICD-10-CM

## 2019-06-11 DIAGNOSIS — R002 Palpitations: Secondary | ICD-10-CM

## 2019-06-11 MED FILL — ?TRAZODONE HCL 50 TABS: 50 | 30 days supply | Qty: 60 | Fill #1

## 2019-06-11 MED FILL — PROPYLTHIOURACIL 50 MG TABS: 50 | 30 days supply | Qty: 360 | Fill #0

## 2019-06-11 MED FILL — AMITRIPTYLINE HCL 25 MG TAB: 25 | 30 days supply | Qty: 30 | Fill #0

## 2019-06-11 MED FILL — ?METOPROLOL TART 25MG TABLE: 25 | 30 days supply | Qty: 90 | Fill #1

## 2019-07-15 ENCOUNTER — Other Ambulatory Visit: Payer: Self-pay | Admitting: Family Medicine

## 2019-07-15 DIAGNOSIS — J449 Chronic obstructive pulmonary disease, unspecified: Secondary | ICD-10-CM

## 2019-08-20 MED FILL — METOPROLOL TARTRATE 25 MG T: 25 | 30 days supply | Qty: 90 | Fill #3

## 2019-08-20 MED FILL — AMITRIPTYLINE HCL 25 MG TAB: 25 | 30 days supply | Qty: 30 | Fill #2

## 2019-09-04 ENCOUNTER — Telehealth: Payer: Self-pay | Admitting: Family Medicine

## 2019-09-04 NOTE — Telephone Encounter (Signed)
Pt wanted you to be aware of thyroid issues and heart palpitations. Pt is going to the hospital soon.

## 2019-09-19 ENCOUNTER — Other Ambulatory Visit: Payer: Self-pay

## 2019-09-19 ENCOUNTER — Emergency Department (HOSPITAL_BASED_OUTPATIENT_CLINIC_OR_DEPARTMENT_OTHER): Payer: No Typology Code available for payment source

## 2019-09-19 ENCOUNTER — Emergency Department (HOSPITAL_BASED_OUTPATIENT_CLINIC_OR_DEPARTMENT_OTHER)
Admission: EM | Admit: 2019-09-19 | Discharge: 2019-09-19 | Disposition: A | Discharge status: Home / Self Care | Payer: No Typology Code available for payment source | Attending: Emergency Medicine | Admitting: Emergency Medicine

## 2019-09-19 ENCOUNTER — Encounter (HOSPITAL_BASED_OUTPATIENT_CLINIC_OR_DEPARTMENT_OTHER): Payer: Self-pay

## 2019-09-19 DIAGNOSIS — Y999 Unspecified external cause status: Secondary | ICD-10-CM | POA: Insufficient documentation

## 2019-09-19 DIAGNOSIS — S46912A Strain of unspecified muscle, fascia and tendon at shoulder and upper arm level, left arm, initial encounter: Secondary | ICD-10-CM

## 2019-09-19 DIAGNOSIS — F17210 Nicotine dependence, cigarettes, uncomplicated: Secondary | ICD-10-CM | POA: Insufficient documentation

## 2019-09-19 DIAGNOSIS — Y9289 Other specified places as the place of occurrence of the external cause: Secondary | ICD-10-CM | POA: Insufficient documentation

## 2019-09-19 DIAGNOSIS — E039 Hypothyroidism, unspecified: Secondary | ICD-10-CM | POA: Insufficient documentation

## 2019-09-19 DIAGNOSIS — W1789XA Other fall from one level to another, initial encounter: Secondary | ICD-10-CM | POA: Insufficient documentation

## 2019-09-19 DIAGNOSIS — Y9389 Activity, other specified: Secondary | ICD-10-CM | POA: Insufficient documentation

## 2019-09-19 DIAGNOSIS — S46012A Strain of muscle(s) and tendon(s) of the rotator cuff of left shoulder, initial encounter: Secondary | ICD-10-CM | POA: Insufficient documentation

## 2019-09-19 MED ORDER — DICLOFENAC SODIUM 1 % EX GEL: 2.0000 g | Freq: Four times a day (QID) | CUTANEOUS | 0 refills | Status: DC

## 2019-09-19 MED ORDER — ACETAMINOPHEN 325 MG PO TABS
650.0000 mg | ORAL_TABLET | Freq: Once | ORAL | Status: AC
  Administered 2019-09-19: 650 mg via ORAL
  Filled 2019-09-19: qty 2

## 2019-09-19 MED ORDER — METHOCARBAMOL 500 MG PO TABS
500.0000 mg | ORAL_TABLET | Freq: Once | ORAL | Status: AC
  Administered 2019-09-19: 500 mg via ORAL
  Filled 2019-09-19: qty 1

## 2019-09-19 MED ORDER — METHOCARBAMOL 500 MG PO TABS
500.0000 mg | ORAL_TABLET | Freq: Two times a day (BID) | ORAL | 0 refills | Status: DC
Start: 2019-09-19 — End: 2020-03-13

## 2019-09-19 MED ORDER — NAPROXEN 250 MG PO TABS
500.0000 mg | ORAL_TABLET | Freq: Once | ORAL | Status: AC
  Administered 2019-09-19: 500 mg via ORAL
  Filled 2019-09-19: qty 2

## 2019-09-19 MED ORDER — NAPROXEN 500 MG PO TABS
500.0000 mg | ORAL_TABLET | Freq: Two times a day (BID) | ORAL | 0 refills | Status: DC
Start: 2019-09-19 — End: 2021-12-16

## 2019-09-19 MED FILL — METHOCARBAMOL 500 MG TABS: 500 | 10 days supply | Qty: 20 | Fill #0

## 2019-09-19 MED FILL — DICLOFENAC SODIUM 1% GEL: 1 | 13 days supply | Qty: 100 | Fill #0

## 2019-09-19 MED FILL — NAPROXEN 500 MG TABS: 500 | 15 days supply | Qty: 30 | Fill #0

## 2019-09-19 NOTE — ED Triage Notes (Signed)
Pt c/o pain to left shoulder while she was climbing a slide yesterday-NAD-steay gait

## 2019-09-19 NOTE — ED Provider Notes (Signed)
[facility] EMERGENCY DEPARTMENT Provider Note   CSN: [id number] Arrival date & time: 09/19/19  1448     History Chief Complaint  Patient presents with   Shoulder Injury    Jennifer Stanton is a 59 y.o. female with a past medical history of seizures, presenting to the ED with a chief complaint of left shoulder pain.  States that she was climbing onto a slide trying to get her grandchild from the top of the slide when she felt like she strained her left shoulder.  She has had aching pain since then that is worse with movement and palpation.  She was concerned that she strained her rotator cuff muscles.  She denies any prior fracture, dislocations or procedures in the area.  She tried taking sleeping pills last night in addition to ibuprofen but did not have much relief or was unable to sleep well.  She denies any chest pain, shortness of breath, numbness or weakness.  HPI     Past Medical History:  Diagnosis Date   Blurry vision 06/20/2014   Cephalalgia 06/20/2014   Grieving 04/2019   Heart palpitations    History of seizure 06/20/2014   Marijuana use 04/2019   Otitis media    Right ear pain    Seizures (HCC)    Thyroid disease     Patient Active Problem List   Diagnosis Date Noted   Grieving 04/22/2019   Hypothyroidism 10/16/2018   Recent weight gain 10/16/2018   Other insomnia 10/16/2018   Right otitis media 08/13/2018   Ear pain, right 08/13/2018   Hyperthyroidism 08/13/2018   Palpitations 08/13/2018   History of seizure 06/20/2014   Blurry vision 06/20/2014   Cephalalgia 06/20/2014    Past Surgical History:  Procedure Laterality Date   INNER EAR SURGERY  at age 57   TUBAL LIGATION  Jul 05, 1991     OB History    Gravida  4   Para      Term      Preterm      AB  1   Living  2     SAB      TAB  1   Ectopic      Multiple      Live Births  3           Family History  Problem Relation Age of Onset    Cancer Mother    COPD Mother    Breast cancer Mother 44   Heart disease Father    Hemochromatosis Brother     Social History   Tobacco Use   Smoking status: Current Every Day Smoker    Packs/day: 0.10    Types: Cigarettes   Smokeless tobacco: Never Used  Vaping Use   Vaping Use: Never used  Substance Use Topics   Alcohol use: Yes    Comment: occ   Drug use: Yes    Types: Marijuana    Home Medications Prior to Admission medications   Medication Sig Start Date End Date Taking? Authorizing Provider  ALPRAZolam Prudy Feeler) 0.5 MG tablet Take 1 tablet (0.5 mg total) by mouth at bedtime as needed for anxiety. 05/13/19   Kallie Locks, FNP  amitriptyline (ELAVIL) 25 MG tablet TAKE 1 TABLET (25 MG TOTAL) BY MOUTH AT BEDTIME. 06/11/19   Kallie Locks, FNP  diclofenac Sodium (VOLTAREN) 1 % GEL Apply 2 g topically 4 (four) times daily. 09/19/19   Bessie Livingood, PA-C  fluticasone furoate-vilanterol (BREO ELLIPTA)  100-25 MCG/INH AEPB Inhale 1 puff into the lungs daily. 07/27/18   Kallie Locks, FNP  methocarbamol (ROBAXIN) 500 MG tablet Take 1 tablet (500 mg total) by mouth 2 (two) times daily. 09/19/19   Philipp Callegari, PA-C  metoprolol tartrate (LOPRESSOR) 25 MG tablet TAKE 2 TABLETS (50 MG) ONCE DAILY IN AM. TAKE 1 TABLET (25 MG) ONCE DAILY IN THE EVENING. 05/06/19   Kallie Locks, FNP  naproxen (NAPROSYN) 500 MG tablet Take 1 tablet (500 mg total) by mouth 2 (two) times daily. 09/19/19   Calee Nugent, PA-C  propylthiouracil (PTU) 50 MG tablet TAKE 4 TABLETS BY MOUTH 3 TIMES DAILY. 06/11/19   Kallie Locks, FNP  traZODone (DESYREL) 50 MG tablet TAKE 1-2 TABLETS (50-100 MG TOTAL) BY MOUTH AT BEDTIME AS NEEDED FOR SLEEP. 05/06/19   Kallie Locks, FNP  VENTOLIN HFA 108 (90 Base) MCG/ACT inhaler INHALE 2 PUFFS INTO THE LUNGS EVERY 4 (FOUR) HOURS AS NEEDED FOR WHEEZING OR SHORTNESS OF BREATH (COUGH, SHORTNESS OF BREATH OR WHEEZING.). 07/15/19   Kallie Locks, FNP    Allergies      Codeine  Review of Systems   Review of Systems  Constitutional: Negative for chills and fever.  Musculoskeletal: Positive for arthralgias and myalgias.  Neurological: Negative for weakness and numbness.    Physical Exam Updated Vital Signs BP 133/84 (BP Location: Right Arm)    Pulse (!) 115    Temp 98.6 F (37 C) (Oral)    Resp 20    Ht 5\' 7"  (1.702 m)    Wt 65.8 kg    SpO2 99%    BMI 22.71 kg/m   Physical Exam Vitals and nursing note reviewed.  Constitutional:      General: She is not in acute distress.    Appearance: She is well-developed. She is not diaphoretic.  HENT:     Head: Normocephalic and atraumatic.  Eyes:     General: No scleral icterus.    Conjunctiva/sclera: Conjunctivae normal.  Pulmonary:     Effort: Pulmonary effort is normal. No respiratory distress.  Musculoskeletal:        General: Tenderness present.     Cervical back: Normal range of motion.     Comments: TTP of the L anterior shoulder with pain with range of motion.  Normal sensation to light touch.  2+ radial pulse noted bilaterally.  No deformities noted.  No erythema, edema or warmth of joint noted.  Skin:    Findings: No rash.  Neurological:     Mental Status: She is alert.     ED Results / Procedures / Treatments   Labs (all labs ordered are listed, but only abnormal results are displayed) Labs Reviewed - No data to display  EKG None  Radiology DG Shoulder Left  Result Date: 09/19/2019 CLINICAL DATA:  Pain EXAM: LEFT SHOULDER - 2+ VIEW COMPARISON:  None. FINDINGS: There is no evidence of fracture or dislocation. There is no evidence of arthropathy or other focal bone abnormality. Soft tissues are unremarkable. IMPRESSION: Negative. Electronically Signed   By: 09/21/2019 M.D.   On: 09/19/2019 15:34    Procedures Procedures (including critical care time)  Medications Ordered in ED Medications  naproxen (NAPROSYN) tablet 500 mg (500 mg Oral Given 09/19/19 1615)   methocarbamol (ROBAXIN) tablet 500 mg (500 mg Oral Given 09/19/19 1615)  acetaminophen (TYLENOL) tablet 650 mg (650 mg Oral Given 09/19/19 1615)    ED Course  I have reviewed the triage  vital signs and the nursing notes.  Pertinent labs & imaging results that were available during my care of the patient were reviewed by me and considered in my medical decision making (see chart for details).    MDM Rules/Calculators/A&P                          59 year old female presenting to the ED for left shoulder pain after climbing up a slide yesterday.  Denies any direct injuries or falls.  Reports pain worse with movement and palpation.  Minimal improvement noted with NSAIDs and trying to take a sleeping pill last night.  On exam there is tenderness palpation of the left anterior shoulder with pain with range of motion.  No deformities noted.  No erythema, edema or warmth of joint noted.  2+ radial pulses noted bilaterally.  X-ray shows no acute findings.  Suspect that symptoms could be due to a strain.  Will treat with NSAIDs, NSAID gel and muscle relaxer.  Patient requesting sling but advised her to continue range of motion as pain starts to alleviate. I doubt infectious or vascular cause of symptoms. Return precautions given.  All imaging, if done today, including plain films, CT scans, and ultrasounds, independently reviewed by me, and interpretations confirmed via formal radiology reads.  Patient is hemodynamically stable, in NAD, and able to ambulate in the ED. Evaluation does not show pathology that would require ongoing emergent intervention or inpatient treatment. I explained the diagnosis to the patient. Pain has been managed and has no complaints prior to discharge. Patient is comfortable with above plan and is stable for discharge at this time. All questions were answered prior to disposition. Strict return precautions for returning to the ED were discussed. Encouraged follow up with PCP.   An  After Visit Summary was printed and given to the patient.   Portions of this note were generated with Scientist, clinical (histocompatibility and immunogenetics). Dictation errors may occur despite best attempts at proofreading.  Final Clinical Impression(s) / ED Diagnoses Final diagnoses:  Strain of left shoulder, initial encounter    Rx / DC Orders ED Discharge Orders         Ordered    naproxen (NAPROSYN) 500 MG tablet  2 times daily     Discontinue  Reprint     09/19/19 1602    methocarbamol (ROBAXIN) 500 MG tablet  2 times daily     Discontinue  Reprint     09/19/19 1602    diclofenac Sodium (VOLTAREN) 1 % GEL  4 times daily     Discontinue  Reprint     09/19/19 1602           Dietrich Pates, PA-C 09/19/19 1622    Melene Plan, DO 09/19/19 1944

## 2019-09-19 NOTE — Discharge Instructions (Signed)
Take the medications as needed to help with your symptoms. You can use a sling for comfort but make sure you are practicing range of motion to prevent stiffness and frozen shoulder. Follow-up with the sports medicine provider listed below. Return to the ER for worsening pain, swelling, if you develop chest pain or additional injuries.

## 2019-09-23 ENCOUNTER — Other Ambulatory Visit: Payer: Self-pay | Admitting: Family Medicine

## 2019-09-23 DIAGNOSIS — I10 Essential (primary) hypertension: Secondary | ICD-10-CM

## 2019-09-23 MED FILL — AMITRIPTYLINE HCL 25 MG TAB: 25 | 30 days supply | Qty: 30 | Fill #3

## 2019-09-23 MED FILL — PROPYLTHIOURACIL 50 MG TABS: 50 | 30 days supply | Qty: 360 | Fill #2

## 2019-09-26 MED FILL — ?TRAZODONE HCL 50 TABS: 50 | 30 days supply | Qty: 60 | Fill #0

## 2019-09-26 MED FILL — ?METOPROLOL TART 25MG TABLE: 25 | 30 days supply | Qty: 90 | Fill #0

## 2019-10-03 ENCOUNTER — Telehealth: Payer: Self-pay | Admitting: Family Medicine

## 2019-10-03 NOTE — Telephone Encounter (Signed)
ED referral pt--called left message to call Dr.Schmitz's office  for appt.  --glh

## 2019-10-16 ENCOUNTER — Ambulatory Visit: Payer: No Typology Code available for payment source | Admitting: Family Medicine

## 2019-10-18 ENCOUNTER — Ambulatory Visit (INDEPENDENT_AMBULATORY_CARE_PROVIDER_SITE_OTHER): Payer: Self-pay | Admitting: Family Medicine

## 2019-10-18 ENCOUNTER — Other Ambulatory Visit: Payer: Self-pay

## 2019-10-18 VITALS — BP 115/72 | HR 85 | Temp 98.4°F | Resp 20 | Ht 64.0 in | Wt 141.6 lb

## 2019-10-18 DIAGNOSIS — E0590 Thyrotoxicosis, unspecified without thyrotoxic crisis or storm: Secondary | ICD-10-CM

## 2019-10-18 DIAGNOSIS — Z0000 Encounter for general adult medical examination without abnormal findings: Secondary | ICD-10-CM

## 2019-10-18 DIAGNOSIS — G4709 Other insomnia: Secondary | ICD-10-CM

## 2019-10-18 DIAGNOSIS — F419 Anxiety disorder, unspecified: Secondary | ICD-10-CM

## 2019-10-18 DIAGNOSIS — Z09 Encounter for follow-up examination after completed treatment for conditions other than malignant neoplasm: Secondary | ICD-10-CM

## 2019-10-18 NOTE — Progress Notes (Signed)
[facility]   Established Patient Office Visit  Subjective:  Patient ID: Jennifer Stanton, female    DOB: 11/07/1960  Age: 59 y.o. MRN: [id number]  CC: No chief complaint on file.   HPI Jennifer Stanton is a 59 year old female who presents for Follow Up today.    Patient Active Problem List   Diagnosis Date Noted  . Grieving 04/22/2019  . Hypothyroidism 10/16/2018  . Recent weight gain 10/16/2018  . Other insomnia 10/16/2018  . Right otitis media 08/13/2018  . Ear pain, right 08/13/2018  . Hyperthyroidism 08/13/2018  . Palpitations 08/13/2018  . History of seizure 06/20/2014  . Blurry vision 06/20/2014  . Cephalalgia 06/20/2014   Past Medical History:  Diagnosis Date  . Blurry vision 06/20/2014  . Cephalalgia 06/20/2014  . Grieving 04/2019  . Heart palpitations   . History of seizure 06/20/2014  . Marijuana use 04/2019  . Otitis media   . Right ear pain   . Seizures (HCC)   . Thyroid disease     Past Surgical History:  Procedure Laterality Date  . INNER EAR SURGERY  at age 59  . TUBAL LIGATION  Jul 05, 1991    Family History  Problem Relation Age of Onset  . Cancer Mother   . COPD Mother   . Breast cancer Mother 437  . Heart disease Father   . Hemochromatosis Brother    Current Status: Since her last office visit, she has c/o vision problems in which she lost her glasses and cannot afford new ones. She denies fevers, chills, fatigue, recent infections, weight loss, and night sweats. She has not had any headaches, visual changes, dizziness, and falls. No chest pain, heart palpitations, cough and shortness of breath reported. Denies GI problems such as nausea, vomiting, diarrhea, and constipation. She has no reports of blood in stools, dysuria and hematuria. No depression or anxiety, and denies suicidal ideations, homicidal ideations, or auditory hallucinations. She is taking all medications as prescribed. she  denies pain today.   Social History   Socioeconomic History  . Marital status: Divorced    Spouse name: Not on file  . Number of children: Not on file  . Years of education: Not on file  . Highest education level: Not on file  Occupational History  . Not on file  Tobacco Use  . Smoking status: Current Every Day Smoker    Packs/day: 0.10    Types: Cigarettes  . Smokeless tobacco: Never Used  Vaping Use  . Vaping Use: Never used  Substance and Sexual Activity  . Alcohol use: Yes    Comment: occ  . Drug use: Yes    Types: Marijuana  . Sexual activity: Not on file  Other Topics Concern  . Not on file  Social History Narrative  . Not on file   Social Determinants of Health   Financial Resource Strain:   . Difficulty of Paying Living Expenses:   Food Insecurity:   . Worried About Programme researcher, broadcasting/film/videounning Out of Food in the Last Year:   . Baristaan Out of Food in the Last Year:   Transportation Needs:   . Freight forwarderLack of Transportation (Medical):   Marland Kitchen. Lack of Transportation (Non-Medical):   Physical Activity:   . Days of Exercise per Week:   . Minutes of Exercise per Session:   Stress:   . Feeling of Stress :   Social Connections:   . Frequency of Communication with Friends  and Family:   . Frequency of Social Gatherings with Friends and Family:   . Attends Religious Services:   . Active Member of Clubs or Organizations:   . Attends Banker Meetings:   Marland Kitchen Marital Status:   Intimate Partner Violence:   . Fear of Current or Ex-Partner:   . Emotionally Abused:   Marland Kitchen Physically Abused:   . Sexually Abused:     Outpatient Medications Prior to Visit  Medication Sig Dispense Refill  . diclofenac Sodium (VOLTAREN) 1 % GEL Apply 2 g topically 4 (four) times daily. 50 g 0  . fluticasone furoate-vilanterol (BREO ELLIPTA) 100-25 MCG/INH AEPB Inhale 1 puff into the lungs daily. 60 each 1  . methocarbamol (ROBAXIN) 500 MG tablet Take 1 tablet (500 mg total) by mouth 2 (two) times daily. 20 tablet  0  . metoprolol tartrate (LOPRESSOR) 25 MG tablet TAKE 2 TABLETS (50 MG) ONCE DAILY IN AM. TAKE 1 TABLET (25 MG) ONCE DAILY IN THE EVENING. 90 tablet 3  . naproxen (NAPROSYN) 500 MG tablet Take 1 tablet (500 mg total) by mouth 2 (two) times daily. 30 tablet 0  . propylthiouracil (PTU) 50 MG tablet TAKE 4 TABLETS BY MOUTH 3 TIMES DAILY. 360 tablet 3  . traZODone (DESYREL) 50 MG tablet TAKE 1-2 TABLETS (50-100 MG TOTAL) BY MOUTH AT BEDTIME AS NEEDED FOR SLEEP. 60 tablet 1  . VENTOLIN HFA 108 (90 Base) MCG/ACT inhaler INHALE 2 PUFFS INTO THE LUNGS EVERY 4 (FOUR) HOURS AS NEEDED FOR WHEEZING OR SHORTNESS OF BREATH (COUGH, SHORTNESS OF BREATH OR WHEEZING.). 18 g 11  . amitriptyline (ELAVIL) 25 MG tablet TAKE 1 TABLET (25 MG TOTAL) BY MOUTH AT BEDTIME. 30 tablet 3  . ALPRAZolam (XANAX) 0.5 MG tablet Take 1 tablet (0.5 mg total) by mouth at bedtime as needed for anxiety. 30 tablet 0   No facility-administered medications prior to visit.    Allergies  Allergen Reactions  . Codeine Rash    ROS Review of Systems  Constitutional: Negative.   HENT: Negative.   Eyes: Positive for visual disturbance (lost her glasses).  Respiratory: Negative.   Cardiovascular: Negative.   Gastrointestinal: Negative.   Endocrine: Negative.   Genitourinary: Negative.   Musculoskeletal: Negative.   Skin: Negative.   Allergic/Immunologic: Negative.   Neurological: Positive for dizziness (occasional ) and headaches (occasional ).  Hematological: Negative.   Psychiatric/Behavioral: Negative.       Objective:    Physical Exam Vitals and nursing note reviewed.  Constitutional:      Appearance: Normal appearance.  HENT:     Head: Normocephalic and atraumatic.     Nose: Nose normal.     Mouth/Throat:     Mouth: Mucous membranes are moist.     Pharynx: Oropharynx is clear.  Cardiovascular:     Rate and Rhythm: Normal rate and regular rhythm.     Pulses: Normal pulses.     Heart sounds: Normal heart sounds.   Pulmonary:     Effort: Pulmonary effort is normal.     Breath sounds: Normal breath sounds.  Abdominal:     General: Bowel sounds are normal.     Palpations: Abdomen is soft.  Musculoskeletal:        General: Normal range of motion.     Cervical back: Normal range of motion and neck supple.  Skin:    General: Skin is warm and dry.  Neurological:     General: No focal deficit present.  Mental Status: She is alert and oriented to person, place, and time.  Psychiatric:        Mood and Affect: Mood normal.        Behavior: Behavior normal.        Thought Content: Thought content normal.        Judgment: Judgment normal.     BP 115/72   Pulse 85   Temp 98.4 F (36.9 C)   Resp 20   Ht 5\' 4"  (1.626 m)   Wt 141 lb 9.6 oz (64.2 kg)   SpO2 99%   BMI 24.31 kg/m  Wt Readings from Last 3 Encounters:  10/18/19 141 lb 9.6 oz (64.2 kg)  09/19/19 145 lb (65.8 kg)  04/19/19 150 lb 12.8 oz (68.4 kg)     Health Maintenance Due  Topic Date Due  . Hepatitis C Screening  Never done  . COVID-19 Vaccine (1) Never done  . HIV Screening  Never done  . COLONOSCOPY  Never done  . MAMMOGRAM  06/02/2019  . INFLUENZA VACCINE  10/06/2019    There are no preventive care reminders to display for this patient.  Lab Results  Component Value Date   TSH <0.005 (L) 10/18/2019   Lab Results  Component Value Date   WBC 4.2 10/18/2019   HGB 14.7 10/18/2019   HCT 42.3 10/18/2019   MCV 82 10/18/2019   PLT 265 10/18/2019   Lab Results  Component Value Date   NA 139 10/18/2019   K 4.4 10/18/2019   CO2 25 10/18/2019   GLUCOSE 97 10/18/2019   BUN 14 10/18/2019   CREATININE 0.53 (L) 10/18/2019   BILITOT 0.5 10/18/2019   ALKPHOS 97 10/18/2019   AST 27 10/18/2019   ALT 35 (H) 10/18/2019   PROT 7.3 10/18/2019   ALBUMIN 4.2 10/18/2019   CALCIUM 9.7 10/18/2019   ANIONGAP 7 02/14/2017   Lab Results  Component Value Date   CHOL 153 10/18/2019   Lab Results  Component Value Date   HDL  51 10/18/2019   Lab Results  Component Value Date   LDLCALC 89 10/18/2019   Lab Results  Component Value Date   TRIG 64 10/18/2019   Lab Results  Component Value Date   CHOLHDL 3.0 10/18/2019   Lab Results  Component Value Date   HGBA1C 5.3 04/19/2019      Assessment & Plan:   1. Anxiety Stable today.   2. Hyperthyroidism  3. Health care maintenance - CBC with Differential - Comprehensive metabolic panel - Lipid Panel - Vitamin B12 - Vitamin D, 25-hydroxy - Thyroid Panel With TSH  4. Follow up She will follow up in 6 months. .  No orders of the defined types were placed in this encounter.   Orders Placed This Encounter  Procedures  . CBC with Differential  . Comprehensive metabolic panel  . Lipid Panel  . Vitamin B12  . Vitamin D, 25-hydroxy  . Thyroid Panel With TSH    Referral Orders  No referral(s) requested today    06/17/2019,  MSN, FNP-BC [facility] [address] [place], [place] [phone] fax  Problem List Items Addressed This Visit      Endocrine   Hyperthyroidism    Other Visit Diagnoses    Anxiety    -  Primary   Health care maintenance       Relevant Orders   CBC with Differential (  Completed)   Comprehensive metabolic panel (Completed)   Lipid Panel (Completed)   Vitamin B12 (Completed)   Vitamin D, 25-hydroxy (Completed)   Thyroid Panel With TSH (Completed)   Follow up          No orders of the defined types were placed in this encounter.   Follow-up: No follow-ups on file.    Kallie Locks, FNP

## 2019-10-19 LAB — COMPREHENSIVE METABOLIC PANEL
ALT: 35 IU/L — ABNORMAL HIGH (ref 0–32)
AST: 27 IU/L (ref 0–40)
Albumin/Globulin Ratio: 1.4 (ref 1.2–2.2)
Albumin: 4.2 g/dL (ref 3.8–4.9)
Alkaline Phosphatase: 97 IU/L (ref 48–121)
BUN/Creatinine Ratio: 26 — ABNORMAL HIGH (ref 9–23)
BUN: 14 mg/dL (ref 6–24)
Bilirubin Total: 0.5 mg/dL (ref 0.0–1.2)
CO2: 25 mmol/L (ref 20–29)
Calcium: 9.7 mg/dL (ref 8.7–10.2)
Chloride: 103 mmol/L (ref 96–106)
Creatinine, Ser: 0.53 mg/dL — ABNORMAL LOW (ref 0.57–1.00)
GFR calc Af Amer: 123 mL/min/{1.73_m2} (ref >59)
GFR calc non Af Amer: 106 mL/min/{1.73_m2} (ref >59)
Globulin, Total: 3.1 g/dL (ref 1.5–4.5)
Glucose: 97 mg/dL (ref 65–99)
Potassium: 4.4 mmol/L (ref 3.5–5.2)
Sodium: 139 mmol/L (ref 134–144)
Total Protein: 7.3 g/dL (ref 6.0–8.5)

## 2019-10-19 LAB — CBC WITH DIFFERENTIAL/PLATELET
Basophils Absolute: 0 10*3/uL (ref 0.0–0.2)
Basos: 0 %
EOS (ABSOLUTE): 0 10*3/uL (ref 0.0–0.4)
Eos: 0 %
Hematocrit: 42.3 % (ref 34.0–46.6)
Hemoglobin: 14.7 g/dL (ref 11.1–15.9)
Immature Grans (Abs): 0 10*3/uL (ref 0.0–0.1)
Immature Granulocytes: 0 %
Lymphocytes Absolute: 2.7 10*3/uL (ref 0.7–3.1)
Lymphs: 65 %
MCH: 28.5 pg (ref 26.6–33.0)
MCHC: 34.8 g/dL (ref 31.5–35.7)
MCV: 82 fL (ref 79–97)
Monocytes Absolute: 0.4 10*3/uL (ref 0.1–0.9)
Monocytes: 9 %
Neutrophils Absolute: 1.1 10*3/uL — ABNORMAL LOW (ref 1.4–7.0)
Neutrophils: 26 %
Platelets: 265 10*3/uL (ref 150–450)
RBC: 5.15 x10E6/uL (ref 3.77–5.28)
RDW: 12.2 % (ref 11.7–15.4)
WBC: 4.2 10*3/uL (ref 3.4–10.8)

## 2019-10-19 LAB — THYROID PANEL WITH TSH
Free Thyroxine Index: 7.1 — ABNORMAL HIGH (ref 1.2–4.9)
T3 Uptake Ratio: 43 % — ABNORMAL HIGH (ref 24–39)
T4, Total: 16.4 ug/dL — ABNORMAL HIGH (ref 4.5–12.0)
TSH: <0.005 u[IU]/mL — ABNORMAL LOW (ref 0.450–4.500)

## 2019-10-19 LAB — VITAMIN B12: Vitamin B-12: 938 pg/mL (ref 232–1245)

## 2019-10-19 LAB — LIPID PANEL
Chol/HDL Ratio: 3 ratio (ref 0.0–4.4)
Cholesterol, Total: 153 mg/dL (ref 100–199)
HDL: 51 mg/dL (ref >39)
LDL Chol Calc (NIH): 89 mg/dL (ref 0–99)
Triglycerides: 64 mg/dL (ref 0–149)
VLDL Cholesterol Cal: 13 mg/dL (ref 5–40)

## 2019-10-19 LAB — VITAMIN D 25 HYDROXY (VIT D DEFICIENCY, FRACTURES): Vit D, 25-Hydroxy: 54.1 ng/mL (ref 30.0–100.0)

## 2019-10-21 ENCOUNTER — Encounter: Payer: Self-pay | Admitting: Family Medicine

## 2019-10-21 MED ORDER — AMITRIPTYLINE HCL 25 MG PO TABS: 25.0000 mg | ORAL_TABLET | Freq: Every day | ORAL | 6 refills | Status: DC

## 2019-10-22 MED FILL — AMITRIPTYLINE HCL 25 MG TAB: 25 | 30 days supply | Qty: 30 | Fill #0

## 2019-10-28 ENCOUNTER — Other Ambulatory Visit: Payer: Self-pay | Admitting: Family Medicine

## 2019-10-28 DIAGNOSIS — E0590 Thyrotoxicosis, unspecified without thyrotoxic crisis or storm: Secondary | ICD-10-CM

## 2019-10-28 DIAGNOSIS — R002 Palpitations: Secondary | ICD-10-CM

## 2019-10-28 DIAGNOSIS — G4709 Other insomnia: Secondary | ICD-10-CM

## 2019-10-28 MED ORDER — PROPYLTHIOURACIL 50 MG PO TABS: ORAL_TABLET | ORAL | 11 refills | Status: DC

## 2019-10-28 MED ORDER — AMITRIPTYLINE HCL 50 MG PO TABS: 50.0000 mg | ORAL_TABLET | Freq: Every day | ORAL | 11 refills | Status: DC

## 2019-10-28 MED FILL — PROPYLTHIOURACIL 50 MG TABS: 50 | 30 days supply | Qty: 360 | Fill #3

## 2019-10-28 MED FILL — AMITRIPTYLINE HCL 50 MG TAB: 50 | 30 days supply | Qty: 30 | Fill #0

## 2019-10-29 NOTE — Telephone Encounter (Signed)
-----   Message from Kallie Locks, FNP sent at 10/28/2019 11:48 AM EDT ----- Regarding: "Rx refills" Please inform patient that refill was sent to pharmacy today for PTU (Thyroid Medication) for # 360 tablets. Also inform her to schedule appointment with Mikle Bosworth' to apply for 'Blue Card' to continue Rxs benefits. Thank you.

## 2019-10-29 NOTE — Telephone Encounter (Signed)
-----   Message from Natalie M Stroud, FNP sent at 10/28/2019 11:48 AM EDT ----- Regarding: "Rx refills" Please inform patient that refill was sent to pharmacy today for PTU (Thyroid Medication) for # 360 tablets. Also inform her to schedule appointment with 'Carlos' to apply for 'Blue Card' to continue Rxs benefits. Thank you.   

## 2019-11-01 MED FILL — ?METOPROLOL TART 25MG TABLE: 25 | 30 days supply | Qty: 90 | Fill #1

## 2019-11-12 ENCOUNTER — Other Ambulatory Visit: Payer: No Typology Code available for payment source

## 2019-11-19 ENCOUNTER — Other Ambulatory Visit: Payer: Self-pay

## 2019-11-19 ENCOUNTER — Other Ambulatory Visit: Payer: No Typology Code available for payment source

## 2019-11-19 DIAGNOSIS — E0590 Thyrotoxicosis, unspecified without thyrotoxic crisis or storm: Secondary | ICD-10-CM

## 2019-11-19 DIAGNOSIS — R002 Palpitations: Secondary | ICD-10-CM

## 2019-11-20 LAB — THYROID PANEL WITH TSH
Free Thyroxine Index: 4.4 (ref 1.2–4.9)
T3 Uptake Ratio: 36 % (ref 24–39)
T4, Total: 12.3 ug/dL — ABNORMAL HIGH (ref 4.5–12.0)
TSH: <0.005 u[IU]/mL — ABNORMAL LOW (ref 0.450–4.500)

## 2019-11-22 ENCOUNTER — Other Ambulatory Visit: Payer: Self-pay | Admitting: Family Medicine

## 2019-11-22 DIAGNOSIS — E0590 Thyrotoxicosis, unspecified without thyrotoxic crisis or storm: Secondary | ICD-10-CM

## 2019-12-04 MED FILL — AMITRIPTYLINE HCL 50 MG TAB: 50 | 30 days supply | Qty: 30 | Fill #1

## 2019-12-04 MED FILL — ?METOPROLOL TART 25MG TABLE: 25 | 30 days supply | Qty: 90 | Fill #2

## 2019-12-16 ENCOUNTER — Ambulatory Visit: Payer: No Typology Code available for payment source

## 2019-12-23 ENCOUNTER — Ambulatory Visit: Payer: No Typology Code available for payment source | Admitting: Family Medicine

## 2020-01-08 MED FILL — AMITRIPTYLINE HCL 50 MG TAB: 50 | 30 days supply | Qty: 30 | Fill #2

## 2020-01-08 MED FILL — ?METOPROLOL TART 25MG TABLE: 25 | 30 days supply | Qty: 90 | Fill #3

## 2020-02-07 ENCOUNTER — Other Ambulatory Visit: Payer: Self-pay | Admitting: Family Medicine

## 2020-02-07 DIAGNOSIS — I10 Essential (primary) hypertension: Secondary | ICD-10-CM

## 2020-02-07 MED FILL — AMITRIPTYLINE HCL 50 MG TAB: 50 | 30 days supply | Qty: 30 | Fill #3

## 2020-02-11 NOTE — Telephone Encounter (Signed)
Please see refill request.

## 2020-02-12 MED FILL — ?METOPROLOL TART 25MG TABLE: 25 | 30 days supply | Qty: 90 | Fill #0

## 2020-02-29 ENCOUNTER — Encounter (HOSPITAL_BASED_OUTPATIENT_CLINIC_OR_DEPARTMENT_OTHER): Payer: Self-pay

## 2020-02-29 ENCOUNTER — Other Ambulatory Visit: Payer: Self-pay

## 2020-02-29 ENCOUNTER — Emergency Department (HOSPITAL_BASED_OUTPATIENT_CLINIC_OR_DEPARTMENT_OTHER)
Admission: EM | Admit: 2020-02-29 | Discharge: 2020-02-29 | Disposition: A | Discharge status: Home / Self Care | Payer: No Typology Code available for payment source | Attending: Emergency Medicine | Admitting: Emergency Medicine

## 2020-02-29 DIAGNOSIS — S61141A Puncture wound with foreign body of right thumb with damage to nail, initial encounter: Secondary | ICD-10-CM | POA: Insufficient documentation

## 2020-02-29 DIAGNOSIS — W458XXA Other foreign body or object entering through skin, initial encounter: Secondary | ICD-10-CM | POA: Insufficient documentation

## 2020-02-29 DIAGNOSIS — F17210 Nicotine dependence, cigarettes, uncomplicated: Secondary | ICD-10-CM | POA: Insufficient documentation

## 2020-02-29 HISTORY — PX: OTHER SURGICAL HISTORY: SHX169

## 2020-02-29 MED ORDER — CEPHALEXIN 250 MG PO CAPS
ORAL_CAPSULE | ORAL | Status: AC
  Administered 2020-02-29: 1000 mg
  Filled 2020-02-29: qty 4

## 2020-02-29 MED ORDER — ONDANSETRON 4 MG PO TBDP
ORAL_TABLET | ORAL | Status: AC
  Filled 2020-02-29: qty 2

## 2020-02-29 MED ORDER — CEPHALEXIN 500 MG PO CAPS
500.0000 mg | ORAL_CAPSULE | Freq: Four times a day (QID) | ORAL | 0 refills | Status: DC
Start: 2020-02-29 — End: 2020-11-11

## 2020-02-29 MED ORDER — HYDROCODONE-ACETAMINOPHEN 5-325 MG PO TABS: 1.0000 | ORAL_TABLET | Freq: Four times a day (QID) | ORAL | 0 refills | Status: DC | PRN

## 2020-02-29 MED ORDER — BUPIVACAINE HCL (PF) 0.5 % IJ SOLN
INTRAMUSCULAR | Status: AC
  Administered 2020-02-29: 50 mg
  Filled 2020-02-29: qty 10

## 2020-02-29 MED ORDER — BUPIVACAINE HCL 0.5 % IJ SOLN: 50.0000 mL | Freq: Once | INTRAMUSCULAR | Status: DC

## 2020-02-29 NOTE — ED Triage Notes (Addendum)
Has a piece of wood stuck in right great thumb nail down to cuticle from wiping down a table

## 2020-02-29 NOTE — ED Provider Notes (Signed)
[facility] Provider Note: Jennifer Dell, MD, FACEP  CSN: [id number] MRN: [id number] ARRIVAL: 02/29/20 at 0203 ROOM: [facility]10   CHIEF COMPLAINT  Nail Problem   HISTORY OF PRESENT ILLNESS  02/29/20 2:15 AM Jennifer Stanton is a 59 y.o. female who has a wood splinter under her right great thumbnail from wiping down a wooden table just prior to arrival.  The splinter proceeds from the distal nail proximally to the nail fold.  She rates associated pain is at 10 out of 10, aching in nature, worse with palpation.   Past Medical History:  Diagnosis Date   Blurry vision 06/20/2014   Cephalalgia 06/20/2014   Grieving 04/2019   Heart palpitations    History of seizure 06/20/2014   Marijuana use 04/2019   Otitis media    Right ear pain    Seizures (HCC)    Thyroid disease     Past Surgical History:  Procedure Laterality Date   INNER EAR SURGERY  at age 84   TUBAL LIGATION  Jul 05, 1991    Family History  Problem Relation Age of Onset   Cancer Mother    COPD Mother    Breast cancer Mother 21   Heart disease Father    Hemochromatosis Brother     Social History   Tobacco Use   Smoking status: Current Every Day Smoker    Packs/day: 0.10    Types: Cigarettes   Smokeless tobacco: Never Used  Vaping Use   Vaping Use: Never used  Substance Use Topics   Alcohol use: Yes    Comment: occ   Drug use: Yes    Types: Marijuana    Prior to Admission medications   Medication Sig Start Date End Date Taking? Authorizing Provider  amitriptyline (ELAVIL) 50 MG tablet Take 1 tablet (50 mg total) by mouth at bedtime. 10/28/19   Kallie Locks, FNP  cephALEXin (KEFLEX) 500 MG capsule Take 1 capsule (500 mg total) by mouth 4 (four) times daily. 02/29/20   Trenace Coughlin, MD  diclofenac Sodium (VOLTAREN) 1 % GEL Apply 2 g topically 4 (four) times daily. 09/19/19   Khatri, Hina, PA-C  fluticasone furoate-vilanterol (BREO ELLIPTA) 100-25 MCG/INH AEPB  Inhale 1 puff into the lungs daily. 07/27/18   Kallie Locks, FNP  HYDROcodone-acetaminophen (NORCO) 5-325 MG tablet Take 1-2 tablets by mouth every 6 (six) hours as needed for severe pain. 02/29/20   Corben Auzenne, MD  methocarbamol (ROBAXIN) 500 MG tablet Take 1 tablet (500 mg total) by mouth 2 (two) times daily. 09/19/19   Khatri, Hina, PA-C  metoprolol tartrate (LOPRESSOR) 25 MG tablet TAKE 2 TABLETS BY MOUTH ONCE DAILY IN THE MORNING AND 1 TABLET ONCE DAILY IN THE EVENING. 02/12/20   Kallie Locks, FNP  naproxen (NAPROSYN) 500 MG tablet Take 1 tablet (500 mg total) by mouth 2 (two) times daily. 09/19/19   Khatri, Hina, PA-C  propylthiouracil (PTU) 50 MG tablet TAKE 4 TABLETS BY MOUTH 3 TIMES DAILY. 10/28/19   Kallie Locks, FNP  VENTOLIN HFA 108 (90 Base) MCG/ACT inhaler INHALE 2 PUFFS INTO THE LUNGS EVERY 4 (FOUR) HOURS AS NEEDED FOR WHEEZING OR SHORTNESS OF BREATH (COUGH, SHORTNESS OF BREATH OR WHEEZING.). 07/15/19   Kallie Locks, FNP    Allergies Codeine   REVIEW OF SYSTEMS  Negative except as noted here or in the History of Present Illness.   PHYSICAL EXAMINATION  Initial Vital Signs Blood pressure 130/62, pulse 94, temperature 98.3 F (  36.8 C), temperature source Oral, resp. rate 18, height 5\' 6"  (1.676 m), weight 68 kg, SpO2 100 %.  Examination General: Well-developed, well-nourished female in no acute distress; appearance consistent with age of record HENT: normocephalic; atraumatic Eyes: pupils equal, round and reactive to light; extraocular muscles intact Neck: supple Heart: regular rate and rhythm Lungs: clear to auscultation bilaterally Abdomen: soft; nondistended; nontender; bowel sounds present Extremities: No deformity; full range of motion; pulses normal Neurologic: Awake, alert and oriented; motor function intact in all extremities and symmetric; no facial droop Skin: Warm and dry; large foreign object under the thenar side of right thumbnail consistent  with reported wood splinter:    Psychiatric: Normal mood and affect   RESULTS  Summary of this visit's results, reviewed and interpreted by myself:   EKG Interpretation  Date/Time:    Ventricular Rate:    PR Interval:    QRS Duration:   QT Interval:    QTC Calculation:   R Axis:     Text Interpretation:        Laboratory Studies: No results found for this or any previous visit (from the past 24 hour(s)). Imaging Studies: No results found.  ED COURSE and MDM  Nursing notes, initial and subsequent vitals signs, including pulse oximetry, reviewed and interpreted by myself.  Vitals:   02/29/20 0210 02/29/20 0212  BP: 130/62   Pulse: 94   Resp: 18   Temp: 98.3 F (36.8 C)   TempSrc: Oral   SpO2: 100%   Weight:  68 kg  Height:  5\' 6"  (1.676 m)   Medications  bupivacaine (MARCAINE) 0.5 % (with pres) injection 50 mL (has no administration in time range)  bupivacaine (MARCAINE) 0.5 % injection (50 mg  Given 02/29/20 0305)  ondansetron (ZOFRAN-ODT) 4 MG disintegrating tablet (  Given 02/29/20 0304)  cephALEXin (KEFLEX) 250 MG capsule (1,000 mg  Given 02/29/20 0306)    3:06 AM Splinter removed after removal of wedge of distal right thumbnail as described below.  Care was taken not to remove the nail from the nailbed to help minimize loss of nail.  Patient advised that a small fragment of foreign matter may still persist proximally where it cannot be seen.  Patient also advised that nail may grow an irregularly due to inadvertent damage to the nail fold either by the foreign body itself or as a result of removal.  We will start the patient on Keflex for infection prophylaxis.  Will refer to hand surgery for follow-up.  PROCEDURES  .Nail Removal  Date/Time: 02/29/2020 3:01 AM Performed by: Jenan Ellegood, MD Authorized by: Kaisley Stiverson, MD   Consent:    Consent obtained:  Verbal   Consent given by:  Patient   Risks, benefits, and alternatives were discussed: yes      Risks discussed:  Bleeding, pain, infection, permanent nail deformity and incomplete removal Universal protocol:    Procedure explained and questions answered to patient or proxy's satisfaction: yes     Relevant documents present and verified: yes     Required blood products, implants, devices, and special equipment available: yes     Site/side marked: no     Immediately prior to procedure, a time out was called: yes     Patient identity confirmed:  Verbally with patient and hospital-assigned identification number Location:    Hand:  R thumb Pre-procedure details:    Skin preparation:  Povidone-iodine Anesthesia:    Anesthesia method:  Nerve block   Block location:  Right thumb   Block needle gauge:  25 G   Block anesthetic:  Bupivacaine 0.5% w/o epi   Block technique:  Digital   Block injection procedure:  Anatomic landmarks identified, introduced needle, incremental injection, anatomic landmarks palpated and negative aspiration for blood   Block outcome:  Anesthesia achieved Nail Removal:    Nail removed:  Partial (Distal wedge resection to allow removal of splinter, involvement of proximal nail fold avoided)   Nail side:  Radial   Nail bed repaired: no     Removed nail replaced and anchored: no   Post-procedure details:    Dressing:  Xeroform gauze and gauze roll   Procedure completion:  Tolerated well, no immediate complications Comments:     Splinter removed after removal of nail wedge.  No remaining foreign body seen but patient advised that a small fragment could still be present proximally.    ED DIAGNOSES     ICD-10-CM   1. Puncture wound of right thumb with foreign body and damage to nail, initial encounter  K81.275T        Paula Libra, MD 02/29/20 [phone]

## 2020-03-13 ENCOUNTER — Other Ambulatory Visit: Payer: Self-pay | Admitting: Family Medicine

## 2020-03-13 ENCOUNTER — Ambulatory Visit (INDEPENDENT_AMBULATORY_CARE_PROVIDER_SITE_OTHER): Payer: Self-pay | Admitting: Family Medicine

## 2020-03-13 ENCOUNTER — Encounter: Payer: Self-pay | Admitting: Family Medicine

## 2020-03-13 ENCOUNTER — Other Ambulatory Visit: Payer: Self-pay

## 2020-03-13 VITALS — BP 112/66 | HR 72 | Temp 98.1°F | Ht 66.0 in | Wt 148.8 lb

## 2020-03-13 DIAGNOSIS — R002 Palpitations: Secondary | ICD-10-CM

## 2020-03-13 DIAGNOSIS — E0590 Thyrotoxicosis, unspecified without thyrotoxic crisis or storm: Secondary | ICD-10-CM

## 2020-03-13 DIAGNOSIS — F419 Anxiety disorder, unspecified: Secondary | ICD-10-CM

## 2020-03-13 DIAGNOSIS — R0602 Shortness of breath: Secondary | ICD-10-CM

## 2020-03-13 DIAGNOSIS — J449 Chronic obstructive pulmonary disease, unspecified: Secondary | ICD-10-CM

## 2020-03-13 DIAGNOSIS — Z09 Encounter for follow-up examination after completed treatment for conditions other than malignant neoplasm: Secondary | ICD-10-CM

## 2020-03-13 DIAGNOSIS — G4709 Other insomnia: Secondary | ICD-10-CM

## 2020-03-13 MED ORDER — ALBUTEROL SULFATE (2.5 MG/3ML) 0.083% IN NEBU: 2.5000 mg | INHALATION_SOLUTION | Freq: Four times a day (QID) | RESPIRATORY_TRACT | 11 refills | Status: DC | PRN

## 2020-03-13 MED ORDER — MONTELUKAST SODIUM 10 MG PO TABS: 10.0000 mg | ORAL_TABLET | Freq: Every day | ORAL | 3 refills | Status: DC

## 2020-03-13 MED ORDER — FLUTICASONE FUROATE-VILANTEROL 100-25 MCG/INH IN AEPB: 1.0000 | INHALATION_SPRAY | Freq: Every day | RESPIRATORY_TRACT | 1 refills | Status: DC

## 2020-03-13 MED FILL — ALBUTEROL 0.083% INHAL SOLN: (2.5 MG/3ML | 6 days supply | Qty: 75 | Fill #0

## 2020-03-13 MED FILL — MONTELUKAST SOD 10 MG TAB: 10 | 30 days supply | Qty: 30 | Fill #0

## 2020-03-13 NOTE — Progress Notes (Signed)
[facility]   Hospital Follow Up   Subjective:  Patient ID: Jennifer Stanton, female    DOB: 05-21-1960  Age: 60 y.o. MRN: [id number]  CC:  Chief Complaint  Patient presents with  . Follow-up    Follow up  for thyroid discuss labs      HPI Jennifer Stanton is a 60 year old female who presents for Hospital Follow Up today.   Patient Active Problem List   Diagnosis Date Noted  . Grieving 04/22/2019  . Hypothyroidism 10/16/2018  . Recent weight gain 10/16/2018  . Other insomnia 10/16/2018  . Right otitis media 08/13/2018  . Ear pain, right 08/13/2018  . Hyperthyroidism 08/13/2018  . Palpitations 08/13/2018  . History of seizure 06/20/2014  . Blurry vision 06/20/2014  . Cephalalgia 06/20/2014   Current Status: Since her last office visit, she has had an ED visit for a Finger Puncture Wound on 02/29/2020. Today she is doing well with no complaints. She does have history of Hyperthyroidism and COPD. She does have occasional heart palpitations. No chest pain, cough and shortness of breath reported. She denies fevers, chills, fatigue, recent infections, weight loss, and night sweats. She has not had any headaches, visual changes, dizziness, and falls.  Denies GI problems such as nausea, vomiting, diarrhea, and constipation. She has no reports of blood in stools, dysuria and hematuria. No depression or anxiety, and denies suicidal ideations, homicidal ideations, or auditory hallucinations. She is taking all medications as prescribed. She denies pain today.   Past Medical History:  Diagnosis Date  . Blurry vision 06/20/2014  . Cephalalgia 06/20/2014  . Grieving 04/2019  . Heart palpitations   . History of seizure 06/20/2014  . Hyperthyroidism   . Marijuana use 04/2019  . Otitis media   . Right ear pain   . Seizures (HCC)   . Thyroid disease     Past Surgical History:  Procedure Laterality Date  . INNER EAR SURGERY  at age  73  . surgey on thumb  02/29/2020  . TUBAL LIGATION  Jul 05, 1991    Family History  Problem Relation Age of Onset  . Cancer Mother   . COPD Mother   . Breast cancer Mother 20  . Heart disease Father   . Hemochromatosis Brother     Social History   Socioeconomic History  . Marital status: Divorced    Spouse name: Not on file  . Number of children: Not on file  . Years of education: Not on file  . Highest education level: Not on file  Occupational History  . Not on file  Tobacco Use  . Smoking status: Current Every Day Smoker    Packs/day: 0.10    Types: Cigarettes  . Smokeless tobacco: Never Used  Vaping Use  . Vaping Use: Never used  Substance and Sexual Activity  . Alcohol use: Yes    Comment: occ  . Drug use: Yes    Types: Marijuana  . Sexual activity: Not on file  Other Topics Concern  . Not on file  Social History Narrative  . Not on file   Social Determinants of Health   Financial Resource Strain: Not on file  Food Insecurity: Not on file  Transportation Needs: Not on file  Physical Activity: Not on file  Stress: Not on file  Social Connections: Not on file  Intimate Partner Violence: Not on file    Outpatient Medications Prior to Visit  Medication Sig Dispense Refill  . amitriptyline (ELAVIL) 50 MG tablet Take 1 tablet (50 mg total) by mouth at bedtime. 30 tablet 11  . cephALEXin (KEFLEX) 500 MG capsule Take 1 capsule (500 mg total) by mouth 4 (four) times daily. 20 capsule 0  . metoprolol tartrate (LOPRESSOR) 25 MG tablet TAKE 2 TABLETS BY MOUTH ONCE DAILY IN THE MORNING AND 1 TABLET ONCE DAILY IN THE EVENING. 270 tablet 3  . naproxen (NAPROSYN) 500 MG tablet Take 1 tablet (500 mg total) by mouth 2 (two) times daily. 30 tablet 0  . propylthiouracil (PTU) 50 MG tablet TAKE 4 TABLETS BY MOUTH 3 TIMES DAILY. 360 tablet 11  . VENTOLIN HFA 108 (90 Base) MCG/ACT inhaler INHALE 2 PUFFS INTO THE LUNGS EVERY 4 (FOUR) HOURS AS NEEDED FOR WHEEZING OR SHORTNESS  OF BREATH (COUGH, SHORTNESS OF BREATH OR WHEEZING.). 18 g 11  . fluticasone furoate-vilanterol (BREO ELLIPTA) 100-25 MCG/INH AEPB Inhale 1 puff into the lungs daily. 60 each 1  . diclofenac Sodium (VOLTAREN) 1 % GEL Apply 2 g topically 4 (four) times daily. (Patient not taking: Reported on 03/13/2020) 50 g 0  . HYDROcodone-acetaminophen (NORCO) 5-325 MG tablet Take 1-2 tablets by mouth every 6 (six) hours as needed for severe pain. (Patient not taking: Reported on 03/13/2020) 10 tablet 0  . methocarbamol (ROBAXIN) 500 MG tablet Take 1 tablet (500 mg total) by mouth 2 (two) times daily. 20 tablet 0   No facility-administered medications prior to visit.    Allergies  Allergen Reactions  . Codeine Rash    ROS Review of Systems  Constitutional: Negative.   HENT: Negative.   Eyes: Negative.   Respiratory: Positive for shortness of breath (occasional ).   Cardiovascular: Positive for palpitations (occasional ).  Gastrointestinal: Negative.   Endocrine: Negative.   Genitourinary: Negative.   Musculoskeletal: Positive for arthralgias (generalized ).  Skin: Negative.   Allergic/Immunologic: Negative.   Neurological: Positive for dizziness (occasional ) and headaches (occasional ).  Hematological: Negative.   Psychiatric/Behavioral: Negative.     Objective:    Physical Exam Vitals and nursing note reviewed.  Constitutional:      Appearance: Normal appearance.  HENT:     Head: Normocephalic and atraumatic.     Nose: Nose normal.     Mouth/Throat:     Mouth: Mucous membranes are moist.     Pharynx: Oropharynx is clear.  Cardiovascular:     Rate and Rhythm: Normal rate and regular rhythm.     Pulses: Normal pulses.     Heart sounds: Normal heart sounds.  Pulmonary:     Effort: Pulmonary effort is normal.     Breath sounds: Normal breath sounds.  Abdominal:     General: Bowel sounds are normal.     Palpations: Abdomen is soft.  Musculoskeletal:        General: Normal range of  motion.     Cervical back: Normal range of motion and neck supple.  Skin:    General: Skin is warm and dry.  Neurological:     General: No focal deficit present.     Mental Status: She is alert and oriented to person, place, and time.  Psychiatric:        Mood and Affect: Mood normal.        Behavior: Behavior normal.        Thought Content: Thought content normal.        Judgment: Judgment normal.     BP 112/66 (BP  Location: Left Arm, Patient Position: Sitting, Cuff Size: Normal)   Pulse 72   Temp 98.1 F (36.7 C) (Temporal)   Ht 5\' 6"  (1.676 m)   Wt 148 lb 12.8 oz (67.5 kg)   SpO2 98%   BMI 24.02 kg/m  Wt Readings from Last 3 Encounters:  03/13/20 148 lb 12.8 oz (67.5 kg)  02/29/20 150 lb (68 kg)  10/18/19 141 lb 9.6 oz (64.2 kg)     Health Maintenance Due  Topic Date Due  . Hepatitis C Screening  Never done  . COVID-19 Vaccine (1) Never done  . HIV Screening  Never done  . COLONOSCOPY (Pts 45-68yrs Insurance coverage will need to be confirmed)  Never done  . MAMMOGRAM  06/02/2019  . INFLUENZA VACCINE  10/06/2019    There are no preventive care reminders to display for this patient.  Lab Results  Component Value Date   TSH <0.005 (L) 03/13/2020   Lab Results  Component Value Date   WBC 4.2 10/18/2019   HGB 14.7 10/18/2019   HCT 42.3 10/18/2019   MCV 82 10/18/2019   PLT 265 10/18/2019   Lab Results  Component Value Date   NA 139 10/18/2019   K 4.4 10/18/2019   CO2 25 10/18/2019   GLUCOSE 97 10/18/2019   BUN 14 10/18/2019   CREATININE 0.53 (L) 10/18/2019   BILITOT 0.5 10/18/2019   ALKPHOS 97 10/18/2019   AST 27 10/18/2019   ALT 35 (H) 10/18/2019   PROT 7.3 10/18/2019   ALBUMIN 4.2 10/18/2019   CALCIUM 9.7 10/18/2019   ANIONGAP 7 02/14/2017   Lab Results  Component Value Date   CHOL 153 10/18/2019   Lab Results  Component Value Date   HDL 51 10/18/2019   Lab Results  Component Value Date   LDLCALC 89 10/18/2019   Lab Results   Component Value Date   TRIG 64 10/18/2019   Lab Results  Component Value Date   CHOLHDL 3.0 10/18/2019   Lab Results  Component Value Date   HGBA1C 5.3 04/19/2019      Assessment & Plan:   1. Chronic obstructive pulmonary disease, unspecified COPD type (HCC) Stable. No signs and symptoms of respiratory distress noted or reported.  - fluticasone furoate-vilanterol (BREO ELLIPTA) 100-25 MCG/INH AEPB; Inhale 1 puff into the lungs daily.  Dispense: 60 each; Refill: 1 - montelukast (SINGULAIR) 10 MG tablet; Take 1 tablet (10 mg total) by mouth at bedtime.  Dispense: 90 tablet; Refill: 3 - albuterol (PROVENTIL) (2.5 MG/3ML) 0.083% nebulizer solution; Take 3 mLs (2.5 mg total) by nebulization every 6 (six) hours as needed for wheezing or shortness of breath.  Dispense: 75 mL; Refill: 11  2. Shortness of breath - fluticasone furoate-vilanterol (BREO ELLIPTA) 100-25 MCG/INH AEPB; Inhale 1 puff into the lungs daily.  Dispense: 60 each; Refill: 1 - montelukast (SINGULAIR) 10 MG tablet; Take 1 tablet (10 mg total) by mouth at bedtime.  Dispense: 90 tablet; Refill: 3 - albuterol (PROVENTIL) (2.5 MG/3ML) 0.083% nebulizer solution; Take 3 mLs (2.5 mg total) by nebulization every 6 (six) hours as needed for wheezing or shortness of breath.  Dispense: 75 mL; Refill: 11  3. Palpitations  4. Hyperthyroidism - T4, Free - T3, Free - Thyroid Panel With TSH  5. Other insomnia  6. Anxiety Stable today.   7. Follow up She will follow up in 10/2020 Annual Physical and Lab work.   Meds ordered this encounter  Medications  . fluticasone furoate-vilanterol (BREO ELLIPTA) 100-25  MCG/INH AEPB    Sig: Inhale 1 puff into the lungs daily.    Dispense:  60 each    Refill:  1  . montelukast (SINGULAIR) 10 MG tablet    Sig: Take 1 tablet (10 mg total) by mouth at bedtime.    Dispense:  90 tablet    Refill:  3  . albuterol (PROVENTIL) (2.5 MG/3ML) 0.083% nebulizer solution    Sig: Take 3 mLs (2.5 mg  total) by nebulization every 6 (six) hours as needed for wheezing or shortness of breath.    Dispense:  75 mL    Refill:  11    No orders of the defined types were placed in this encounter.   Referral Orders  No referral(s) requested today    Raliegh Ip, MSN, ANE, FNP-BC [facility] [address] [place], [place] [address] [phone] fax   Problem List Items Addressed This Visit      Endocrine   Hyperthyroidism     Other   Other insomnia   Palpitations    Other Visit Diagnoses    Chronic obstructive pulmonary disease, unspecified COPD type (HCC)    -  Primary   Relevant Medications   fluticasone furoate-vilanterol (BREO ELLIPTA) 100-25 MCG/INH AEPB   montelukast (SINGULAIR) 10 MG tablet   albuterol (PROVENTIL) (2.5 MG/3ML) 0.083% nebulizer solution   Shortness of breath       Relevant Medications   fluticasone furoate-vilanterol (BREO ELLIPTA) 100-25 MCG/INH AEPB   montelukast (SINGULAIR) 10 MG tablet   albuterol (PROVENTIL) (2.5 MG/3ML) 0.083% nebulizer solution   Anxiety       Follow up          Meds ordered this encounter  Medications  . fluticasone furoate-vilanterol (BREO ELLIPTA) 100-25 MCG/INH AEPB    Sig: Inhale 1 puff into the lungs daily.    Dispense:  60 each    Refill:  1  . montelukast (SINGULAIR) 10 MG tablet    Sig: Take 1 tablet (10 mg total) by mouth at bedtime.    Dispense:  90 tablet    Refill:  3  . albuterol (PROVENTIL) (2.5 MG/3ML) 0.083% nebulizer solution    Sig: Take 3 mLs (2.5 mg total) by nebulization every 6 (six) hours as needed for wheezing or shortness of breath.    Dispense:  75 mL    Refill:  11    Follow-up: No follow-ups on file.    Kallie Locks, FNP

## 2020-03-13 NOTE — Patient Instructions (Signed)
Montelukast oral tablets What is this medicine? MONTELUKAST (mon te LOO kast) is used to prevent and treat the symptoms of asthma. It is also used to treat allergies. Do not use for an acute asthma attack. This medicine may be used for other purposes; ask your health care provider or pharmacist if you have questions. COMMON BRAND NAME(S): Singulair What should I tell my health care provider before I take this medicine? They need to know if you have any of these conditions:  liver disease  an unusual or allergic reaction to montelukast, other medicines, foods, dyes, or preservatives  pregnant or trying to get pregnant  breast-feeding How should I use this medicine? This medicine should be given by mouth. Follow the directions on the prescription label. Take this medicine at the same time every day. You may take this medicine with or without meals. Do not chew the tablets. Do not stop taking your medicine unless your doctor tells you to. Talk to your pediatrician regarding the use of this medicine in children. Special care may be needed. While this drug may be prescribed for children as young as 41 years of age for selected conditions, precautions do apply. Overdosage: If you think you have taken too much of this medicine contact a poison control center or emergency room at once. NOTE: This medicine is only for you. Do not share this medicine with others. What if I miss a dose? If you miss a dose, skip it. Take your next dose at the normal time. Do not take extra or 2 doses at the same time to make up for the missed dose. What may interact with this medicine?  anti-infectives like rifampin and rifabutin  medicines for seizures like phenytoin, phenobarbital, and carbamazepine This list may not describe all possible interactions. Give your health care provider a list of all the medicines, herbs, non-prescription drugs, or dietary supplements you use. Also tell them if you smoke, drink alcohol,  or use illegal drugs. Some items may interact with your medicine. What should I watch for while using this medicine? Visit your doctor or health care professional for regular checks on your progress. Tell your doctor or health care professional if your allergy or asthma symptoms do not improve. Take your medicine even when you do not have symptoms. Do not stop taking any of your medicine(s) unless your doctor tells you to. If you have asthma, talk to your doctor about what to do in an acute asthma attack. Always have your inhaled rescue medicine for asthma attacks with you. Patients and their families should watch for new or worsening thoughts of suicide or depression. Also watch for sudden changes in feelings such as feeling anxious, agitated, panicky, irritable, hostile, aggressive, impulsive, severely restless, overly excited and hyperactive, or not being able to sleep. Any worsening of mood or thoughts of suicide or dying should be reported to your health care professional right away. What side effects may I notice from receiving this medicine? Side effects that you should report to your doctor or health care professional as soon as possible:  allergic reactions like skin rash or hives, or swelling of the face, lips, or tongue  breathing problems  changes in emotions or moods  confusion  depressed mood  fever or infection  hallucinations  joint pain  painful lumps under the skin  pain, tingling, numbness in the hands or feet  redness, blistering, peeling, or loosening of the skin, including inside the mouth  restlessness  seizures  sleep  walking  signs and symptoms of infection like fever; chills; cough; sore throat; flu-like illness  signs and symptoms of liver injury like dark yellow or brown urine; general ill feeling or flu-like symptoms; light-colored stools; loss of appetite; nausea; right upper belly pain; unusually weak or tired; yellowing of the eyes or  skin  sinus pain or swelling  stuttering  suicidal thoughts or other mood changes  tremors  trouble sleeping  uncontrolled muscle movements  unusual bleeding or bruising  vivid or bad dreams Side effects that usually do not require medical attention (report to your doctor or health care professional if they continue or are bothersome):  dizziness  drowsiness  headache  runny nose  stomach upset  tiredness This list may not describe all possible side effects. Call your doctor for medical advice about side effects. You may report side effects to FDA at 1-800-FDA-1088. Where should I keep my medicine? Keep out of the reach of children. Store at room temperature between 15 and 30 degrees C (59 and 86 degrees F). Protect from light and moisture. Keep this medicine in the original bottle. Throw away any unused medicine after the expiration date. NOTE: This sheet is a summary. It may not cover all possible information. If you have questions about this medicine, talk to your doctor, pharmacist, or health care provider.  2020 Elsevier/Gold Standard (2018-06-22 12:54:33) Albuterol inhalation solution What is this medicine? ALBUTEROL (al Gaspar Bidding) is a bronchodilator. It helps to open up the airways in your lungs to make it easier to breathe. This medicine is used to treat and to prevent bronchospasm. This medicine may be used for other purposes; ask your health care provider or pharmacist if you have questions. COMMON BRAND NAME(S): Accuneb, Proventil What should I tell my health care provider before I take this medicine? They need to know if you have any of the following conditions:  diabetes  heart disease or irregular heartbeat  high blood pressure  pheochromocytoma  seizures  thyroid disease  an unusual or allergic reaction to albuterol, levalbuterol, other medicines, foods, dyes, or preservatives  pregnant or trying to get pregnant  breast-feeding How  should I use this medicine? This medicine is used in a nebulizer. Nebulizers make a liquid into an aerosol that you breathe in through your mouth or your mouth and nose into your lungs. You will be taught how to use your nebulizer. Follow the directions on your prescription label. Take your medicine at regular intervals. Do not use more often than directed. Talk to your pediatrician regarding the use of this medicine in children. While this drug may be prescribed for children as young as 2 years for selected conditions, precautions do apply. Overdosage: If you think you have taken too much of this medicine contact a poison control center or emergency room at once. NOTE: This medicine is only for you. Do not share this medicine with others. What if I miss a dose? If you miss a dose, use it as soon as you can. If it is almost time for your next dose, use only that dose. Do not use double or extra doses. What may interact with this medicine?  anti-infectives like chloroquine and pentamidine  caffeine  cisapride  diuretics  medicines for colds  medicines for depression or emotional or psychotic conditions  medicines for weight loss including some herbal products  methadone  some antibiotics like clarithromycin, erythromycin, levofloxacin, and linezolid  some heart medicines  steroid hormones like dexamethasone, cortisone,  hydrocortisone  theophylline  thyroid hormones This list may not describe all possible interactions. Give your health care provider a list of all the medicines, herbs, non-prescription drugs, or dietary supplements you use. Also tell them if you smoke, drink alcohol, or use illegal drugs. Some items may interact with your medicine. What should I watch for while using this medicine? Tell your doctor or health care professional if your symptoms do not improve. Do not use extra albuterol. Call your doctor right away if your asthma or bronchitis gets worse while you are  using this medicine. If your mouth gets dry try chewing sugarless gum or sucking hard candy. Drink water as directed. What side effects may I notice from receiving this medicine? Side effects that you should report to your doctor or health care professional as soon as possible:  allergic reactions like skin rash, itching or hives, swelling of the face, lips, or tongue  breathing problems  chest pain  feeling faint or lightheaded, falls  high blood pressure  irregular heartbeat  fever  muscle cramps or weakness  pain, tingling, numbness in the hands or feet  vomiting Side effects that usually do not require medical attention (report to your doctor or health care professional if they continue or are bothersome):  changes in taste  cough  dry mouth  headache  nervousness or trembling  stomach upset  stuffy or runny nose  throat irritation  trouble sleeping This list may not describe all possible side effects. Call your doctor for medical advice about side effects. You may report side effects to FDA at 1-800-FDA-1088. Where should I keep my medicine? Keep out of the reach of children. Store between 2 and 25 degrees C (36 and 77 degrees F). Do not freeze. Protect from light. Throw away any unused medicine after the expiration date. Most products are kept in the foil package until time of use. Some products can be used up to 1 week after they are removed from the foil pouch. Check the instructions that come with your medicine. NOTE: This sheet is a summary. It may not cover all possible information. If you have questions about this medicine, talk to your doctor, pharmacist, or health care provider.  2020 Elsevier/Gold Standard (2018-06-07 13:12:34)

## 2020-03-14 LAB — THYROID PANEL WITH TSH
Free Thyroxine Index: 3.9 (ref 1.2–4.9)
T3 Uptake Ratio: 34 % (ref 24–39)
T4, Total: 11.6 ug/dL (ref 4.5–12.0)
TSH: <0.005 u[IU]/mL — ABNORMAL LOW (ref 0.450–4.500)

## 2020-03-14 LAB — T4, FREE: Free T4: 2.51 ng/dL — ABNORMAL HIGH (ref 0.82–1.77)

## 2020-03-14 LAB — T3, FREE: T3, Free: 5.8 pg/mL — ABNORMAL HIGH (ref 2.0–4.4)

## 2020-03-16 MED FILL — METOPROLOL TARTRATE 25 MG T: 25 | 30 days supply | Qty: 90 | Fill #1

## 2020-03-16 MED FILL — AMITRIPTYLINE HCL 50 MG TAB: 50 | 30 days supply | Qty: 30 | Fill #4

## 2020-03-19 ENCOUNTER — Encounter: Payer: Self-pay | Admitting: Family Medicine

## 2020-03-25 ENCOUNTER — Telehealth: Payer: Self-pay | Admitting: Family Medicine

## 2020-03-25 NOTE — Telephone Encounter (Signed)
Attempted to contact patient to review recent labs.  

## 2020-04-14 MED FILL — AMITRIPTYLINE HCL 50 MG TAB: 50 | 30 days supply | Qty: 30 | Fill #5

## 2020-04-14 MED FILL — ?TRAZODONE HCL 50 TABS: 50 | 30 days supply | Qty: 60 | Fill #1

## 2020-04-14 MED FILL — PROPYLTHIOURACIL 50 MG TABS: 50 | 30 days supply | Qty: 360 | Fill #0

## 2020-04-14 MED FILL — METOPROLOL TARTRATE 25 MG T: 25 | 30 days supply | Qty: 90 | Fill #2

## 2020-04-20 ENCOUNTER — Ambulatory Visit: Payer: No Typology Code available for payment source | Admitting: Family Medicine

## 2020-05-18 MED FILL — ?AMITRIPTYLINE HCL 50 MG TA: 50 | 30 days supply | Qty: 30 | Fill #6

## 2020-05-18 MED FILL — ?METOPROLOL TARTRATE 25MG T: 25 | 30 days supply | Qty: 90 | Fill #3

## 2020-06-17 ENCOUNTER — Other Ambulatory Visit: Payer: Self-pay

## 2020-06-17 MED ORDER — METOPROLOL TARTRATE 25 MG PO TABS
25.0000 mg | ORAL_TABLET | Freq: Three times a day (TID) | ORAL | 11 refills | Status: DC
Start: 2020-02-12 — End: 2021-03-13
  Filled 2020-06-17: qty 90, 30d supply, fill #0
  Filled 2020-07-15: qty 90, 30d supply, fill #1
  Filled 2020-08-16: qty 90, 30d supply, fill #2
  Filled 2020-09-22: qty 90, 30d supply, fill #3
  Filled 2020-11-02: qty 90, 30d supply, fill #4
  Filled 2020-12-18: qty 90, 30d supply, fill #5
  Filled 2021-02-01: qty 90, 30d supply, fill #6

## 2020-06-17 MED FILL — Propylthiouracil Tab 50 MG: ORAL | 30 days supply | Qty: 360 | Fill #0 | Status: AC

## 2020-06-17 MED FILL — Amitriptyline HCl Tab 50 MG: ORAL | 30 days supply | Qty: 30 | Fill #0 | Status: AC

## 2020-06-18 ENCOUNTER — Other Ambulatory Visit: Payer: Self-pay

## 2020-07-15 ENCOUNTER — Other Ambulatory Visit: Payer: Self-pay

## 2020-07-15 MED FILL — Propylthiouracil Tab 50 MG: ORAL | 30 days supply | Qty: 360 | Fill #1 | Status: CN

## 2020-07-15 MED FILL — Amitriptyline HCl Tab 50 MG: ORAL | 30 days supply | Qty: 30 | Fill #1 | Status: AC

## 2020-07-22 ENCOUNTER — Other Ambulatory Visit: Payer: Self-pay

## 2020-08-16 MED FILL — Propylthiouracil Tab 50 MG: ORAL | 30 days supply | Qty: 360 | Fill #1 | Status: AC

## 2020-08-16 MED FILL — Amitriptyline HCl Tab 50 MG: ORAL | 30 days supply | Qty: 30 | Fill #2 | Status: AC

## 2020-08-17 ENCOUNTER — Other Ambulatory Visit: Payer: Self-pay

## 2020-09-22 MED FILL — Amitriptyline HCl Tab 50 MG: ORAL | 30 days supply | Qty: 30 | Fill #3 | Status: AC

## 2020-09-22 MED FILL — Propylthiouracil Tab 50 MG: ORAL | 30 days supply | Qty: 360 | Fill #2 | Status: AC

## 2020-09-23 ENCOUNTER — Other Ambulatory Visit: Payer: Self-pay

## 2020-09-28 ENCOUNTER — Other Ambulatory Visit: Payer: Self-pay

## 2020-10-09 ENCOUNTER — Ambulatory Visit: Payer: No Typology Code available for payment source | Admitting: Nurse Practitioner

## 2020-10-12 ENCOUNTER — Encounter: Payer: No Typology Code available for payment source | Admitting: Family Medicine

## 2020-11-02 ENCOUNTER — Other Ambulatory Visit: Payer: Self-pay | Admitting: Nurse Practitioner

## 2020-11-02 ENCOUNTER — Other Ambulatory Visit: Payer: Self-pay

## 2020-11-03 ENCOUNTER — Other Ambulatory Visit: Payer: Self-pay

## 2020-11-04 NOTE — Telephone Encounter (Signed)
Left voicemail to call office for an appointment.

## 2020-11-05 NOTE — Telephone Encounter (Signed)
Pt has an appt on 11/11/20.

## 2020-11-11 ENCOUNTER — Other Ambulatory Visit: Payer: Self-pay

## 2020-11-11 ENCOUNTER — Ambulatory Visit (INDEPENDENT_AMBULATORY_CARE_PROVIDER_SITE_OTHER): Payer: Self-pay | Admitting: Nurse Practitioner

## 2020-11-11 ENCOUNTER — Encounter: Payer: Self-pay | Admitting: Nurse Practitioner

## 2020-11-11 VITALS — BP 121/69 | HR 53 | Temp 97.0°F | Ht 66.0 in | Wt 146.0 lb

## 2020-11-11 DIAGNOSIS — F419 Anxiety disorder, unspecified: Secondary | ICD-10-CM

## 2020-11-11 DIAGNOSIS — Z0000 Encounter for general adult medical examination without abnormal findings: Secondary | ICD-10-CM

## 2020-11-11 DIAGNOSIS — E0590 Thyrotoxicosis, unspecified without thyrotoxic crisis or storm: Secondary | ICD-10-CM

## 2020-11-11 DIAGNOSIS — G4709 Other insomnia: Secondary | ICD-10-CM

## 2020-11-11 MED ORDER — TRAZODONE HCL 50 MG PO TABS
50.0000 mg | ORAL_TABLET | Freq: Every evening | ORAL | 1 refills | Status: DC | PRN
Start: 2020-11-11 — End: 2021-03-17
  Filled 2020-11-11: qty 60, 30d supply, fill #0

## 2020-11-11 MED ORDER — AMITRIPTYLINE HCL 50 MG PO TABS
ORAL_TABLET | Freq: Every day | ORAL | 11 refills | Status: DC
  Filled 2020-11-11 – 2021-10-26 (×3): qty 30, 30d supply, fill #0

## 2020-11-11 MED ORDER — PROPYLTHIOURACIL 50 MG PO TABS
ORAL_TABLET | ORAL | 11 refills | Status: DC
  Filled 2020-11-11 – 2021-10-26 (×3): qty 360, 30d supply, fill #0

## 2020-11-11 NOTE — Progress Notes (Signed)
[facility] Patient [facility] [address] [place], [place]  [address] Phone:  [phone]   Fax:  [phone] Subjective:   Patient ID: Jennifer Stanton, female    DOB: 1961-02-16, 60 y.o.   MRN: [id number]  Chief Complaint  Patient presents with   Follow-up    No questions or concerns.    HPI Jennifer Stanton 60 y.o. female presents to the [facility] for follow up for chronic illnesses. Patient states that she has been compliant with medications for COPD, with no acute changes in symptoms. States that she has an upcoming appointment with pulmonology. Has not had medications for hyperthyroidism in 2 mths, due to inability to complete follow up. States that she did not have access to transportation. States that she applied for financial assistance, but was unsuccessful. Currently does have access to transportation and is able to complete any upcoming appointments. Requesting refill of medications. Currently smokes 5-7 cigarettes/ day, working on quitting. Declined any prescription for nicotine patch due to financial difficulties. Denies any fever, chest pain or acute changes in dyspnea symptoms. Currently monitors diet and exercises regularly.     Past Medical History:  Diagnosis Date   Blurry vision 06/20/2014   Cephalalgia 06/20/2014   Grieving 04/2019   Heart palpitations    History of seizure 06/20/2014   Hyperthyroidism    Marijuana use 04/2019   Otitis media    Right ear pain    Seizures (HCC)    Thyroid disease     Past Surgical History:  Procedure Laterality Date   INNER EAR SURGERY  at age 65   surgey on thumb  02/29/2020   TUBAL LIGATION  Jul 05, 1991    Family History  Problem Relation Age of Onset   Cancer Mother    COPD Mother    Breast cancer Mother 58   Heart disease Father    Hemochromatosis Brother     Social History   Socioeconomic History   Marital status: Divorced    Spouse name: Not on file   Number of children: Not on file   Years of  education: Not on file   Highest education level: Not on file  Occupational History   Not on file  Tobacco Use   Smoking status: Every Day    Packs/day: 0.10    Types: Cigarettes   Smokeless tobacco: Never  Vaping Use   Vaping Use: Never used  Substance and Sexual Activity   Alcohol use: Yes    Comment: occ   Drug use: Yes    Types: Marijuana   Sexual activity: Not on file  Other Topics Concern   Not on file  Social History Narrative   Not on file   Social Determinants of Health   Financial Resource Strain: Not on file  Food Insecurity: Not on file  Transportation Needs: Not on file  Physical Activity: Not on file  Stress: Not on file  Social Connections: Not on file  Intimate Partner Violence: Not on file    Outpatient Medications Prior to Visit  Medication Sig Dispense Refill   albuterol (PROVENTIL) (2.5 MG/3ML) 0.083% nebulizer solution TAKE 3 MLS (2.5 MG TOTAL) BY NEBULIZATION EVERY 6 (SIX) HOURS AS NEEDED FOR WHEEZING OR SHORTNESS OF BREATH. 75 mL 11   amitriptyline (ELAVIL) 50 MG tablet Take 1 tablet (50 mg total) by mouth at bedtime. 30 tablet 11   fluticasone furoate-vilanterol (BREO ELLIPTA) 100-25 MCG/INH AEPB INHALE 1 PUFF INTO THE LUNGS DAILY. 60  each 1   metoprolol tartrate (LOPRESSOR) 25 MG tablet TAKE 2 TABLETS BY MOUTH ONCE DAILY IN THE MORNING AND 1 TABLET ONCE DAILY IN THE EVENING. 90 tablet 11   VENTOLIN HFA 108 (90 Base) MCG/ACT inhaler INHALE 2 PUFFS INTO THE LUNGS EVERY 4 (FOUR) HOURS AS NEEDED FOR WHEEZING OR SHORTNESS OF BREATH (COUGH, SHORTNESS OF BREATH OR WHEEZING.). 18 g 11   montelukast (SINGULAIR) 10 MG tablet TAKE 1 TABLET (10 MG TOTAL) BY MOUTH AT BEDTIME. (Patient not taking: Reported on 11/11/2020) 90 tablet 3   naproxen (NAPROSYN) 500 MG tablet Take 1 tablet (500 mg total) by mouth 2 (two) times daily. (Patient not taking: Reported on 11/11/2020) 30 tablet 0   albuterol (PROVENTIL) (2.5 MG/3ML) 0.083% nebulizer solution Take 3 mLs (2.5 mg  total) by nebulization every 6 (six) hours as needed for wheezing or shortness of breath. 75 mL 11   amitriptyline (ELAVIL) 50 MG tablet TAKE 1 TABLET (50 MG TOTAL) BY MOUTH AT BEDTIME. 30 tablet 11   cephALEXin (KEFLEX) 500 MG capsule Take 1 capsule (500 mg total) by mouth 4 (four) times daily. 20 capsule 0   diclofenac Sodium (VOLTAREN) 1 % GEL Apply 2 g topically 4 (four) times daily. (Patient not taking: Reported on 03/13/2020) 50 g 0   fluticasone furoate-vilanterol (BREO ELLIPTA) 100-25 MCG/INH AEPB Inhale 1 puff into the lungs daily. 60 each 1   metoprolol tartrate (LOPRESSOR) 25 MG tablet TAKE 2 TABLETS BY MOUTH ONCE DAILY IN THE MORNING AND 1 TABLET ONCE DAILY IN THE EVENING. 270 tablet 3   metoprolol tartrate (LOPRESSOR) 25 MG tablet TAKE 2 TABLETS BY MOUTH ONCE DAILY IN THE MORNING AND 1 TABLET ONCE DAILY IN THE EVENING. 90 tablet 3   montelukast (SINGULAIR) 10 MG tablet Take 1 tablet (10 mg total) by mouth at bedtime. (Patient not taking: Reported on 11/11/2020) 90 tablet 3   propylthiouracil (PTU) 50 MG tablet TAKE 4 TABLETS BY MOUTH 3 TIMES DAILY. 360 tablet 11   propylthiouracil (PTU) 50 MG tablet TAKE 4 TABLETS BY MOUTH 3 TIMES DAILY. 360 tablet 11   No facility-administered medications prior to visit.    Allergies  Allergen Reactions   Codeine Rash    Review of Systems  Constitutional:  Negative for chills, fever and malaise/fatigue.  Eyes: Negative.   Respiratory:  Negative for cough and shortness of breath.   Cardiovascular:  Negative for chest pain, palpitations and leg swelling.  Gastrointestinal:  Negative for abdominal pain, blood in stool, constipation, diarrhea, nausea and vomiting.  Musculoskeletal: Negative.   Skin: Negative.   Neurological: Negative.   Psychiatric/Behavioral:  Negative for depression. The patient is not nervous/anxious.   All other systems reviewed and are negative.     Objective:    Physical Exam Vitals reviewed.  Constitutional:       General: She is not in acute distress.    Appearance: Normal appearance. She is not ill-appearing.  HENT:     Head: Normocephalic.  Cardiovascular:     Rate and Rhythm: Normal rate and regular rhythm.     Pulses: Normal pulses.     Heart sounds: Normal heart sounds.     Comments: No obvious peripheral edema Pulmonary:     Effort: Pulmonary effort is normal.     Breath sounds: Normal breath sounds.  Skin:    General: Skin is warm and dry.     Capillary Refill: Capillary refill takes less than 2 seconds.  Neurological:  General: No focal deficit present.     Mental Status: She is alert and oriented to person, place, and time. Mental status is at baseline.  Psychiatric:        Mood and Affect: Mood normal.        Behavior: Behavior normal.        Thought Content: Thought content normal.        Judgment: Judgment normal.    BP 121/69   Pulse (!) 53   Temp (!) 97 F (36.1 C)   Ht 5\' 6"  (1.676 m)   Wt 146 lb (66.2 kg)   SpO2 99%   BMI 23.57 kg/m  Wt Readings from Last 3 Encounters:  11/11/20 146 lb (66.2 kg)  03/13/20 148 lb 12.8 oz (67.5 kg)  02/29/20 150 lb (68 kg)    Immunization History  Administered Date(s) Administered   Influenza,inj,Quad PF,6+ Mos 04/28/2017, 02/13/2018   PFIZER(Purple Top)SARS-COV-2 Vaccination 07/10/2019, 08/01/2019, 05/13/2020   Tdap 01/01/2013    Diabetic Foot Exam - Simple   No data filed     Lab Results  Component Value Date   TSH <0.005 (L) 03/13/2020   Lab Results  Component Value Date   WBC 4.2 10/18/2019   HGB 14.7 10/18/2019   HCT 42.3 10/18/2019   MCV 82 10/18/2019   PLT 265 10/18/2019   Lab Results  Component Value Date   NA 139 10/18/2019   K 4.4 10/18/2019   CO2 25 10/18/2019   GLUCOSE 97 10/18/2019   BUN 14 10/18/2019   CREATININE 0.53 (L) 10/18/2019   BILITOT 0.5 10/18/2019   ALKPHOS 97 10/18/2019   AST 27 10/18/2019   ALT 35 (H) 10/18/2019   PROT 7.3 10/18/2019   ALBUMIN 4.2 10/18/2019   CALCIUM 9.7  10/18/2019   ANIONGAP 7 02/14/2017   Lab Results  Component Value Date   CHOL 153 10/18/2019   CHOL 193 10/16/2018   Lab Results  Component Value Date   HDL 51 10/18/2019   HDL 56 10/16/2018   Lab Results  Component Value Date   LDLCALC 89 10/18/2019   LDLCALC 118 (H) 10/16/2018   Lab Results  Component Value Date   TRIG 64 10/18/2019   TRIG 93 10/16/2018   Lab Results  Component Value Date   CHOLHDL 3.0 10/18/2019   CHOLHDL 3.4 10/16/2018   Lab Results  Component Value Date   HGBA1C 5.3 04/19/2019   HGBA1C 4.9 04/17/2018   HGBA1C 5.2 03/29/2017       Assessment & Plan:   Problem List Items Addressed This Visit       Endocrine   Hyperthyroidism - Primary   Relevant Orders   TSH Follow up in 6 wks for TSH     Other   Other insomnia   Relevant Medications   traZODone (DESYREL) 50 MG tablet   Other Visit Diagnoses     Anxiety       Relevant Medications   traZODone (DESYREL) 50 MG tablet   Health care maintenance       Relevant Orders   CBC with Differential/Platelet   Comprehensive metabolic panel   Hemoglobin A1c   Lipid panel Encouraged continued diet and exercise efforts  Encouraged continued compliance with medication  COPD Encouraged continued compliance with medications Encouraged to maintain follow up with pulmonology    Follow up in 6 wks for reevaluation of chronic illness    I have discontinued Mirjana L. Agostinelli's diclofenac Sodium, propylthiouracil, and cephALEXin. I am also  having her maintain her Ventolin HFA, naproxen, amitriptyline, albuterol, montelukast, fluticasone furoate-vilanterol, metoprolol tartrate, and traZODone.  Meds ordered this encounter  Medications   traZODone (DESYREL) 50 MG tablet    Sig: Take 1-2 tablets (50-100 mg total) by mouth at bedtime as needed for sleep.    Dispense:  60 tablet    Refill:  1     Kathrynn Speed, NP

## 2020-11-11 NOTE — Patient Instructions (Signed)
You were seen today in the [facility] for follow up for chronic illness. Labs were collected, results will be available via MyChart or, if abnormal, you will be contacted by clinic staff. You were prescribed medications, please take as directed. Please follow up in 6 wk for reevaluation. Please maintain follow up with pulmonology.

## 2020-11-12 LAB — LIPID PANEL
Chol/HDL Ratio: 3.8 ratio (ref 0.0–4.4)
Cholesterol, Total: 223 mg/dL — ABNORMAL HIGH (ref 100–199)
HDL: 59 mg/dL (ref >39)
LDL Chol Calc (NIH): 132 mg/dL — ABNORMAL HIGH (ref 0–99)
Triglycerides: 182 mg/dL — ABNORMAL HIGH (ref 0–149)
VLDL Cholesterol Cal: 32 mg/dL (ref 5–40)

## 2020-11-12 LAB — COMPREHENSIVE METABOLIC PANEL
ALT: 23 IU/L (ref 0–32)
AST: 21 IU/L (ref 0–40)
Albumin/Globulin Ratio: 1.6 (ref 1.2–2.2)
Albumin: 4.8 g/dL (ref 3.8–4.9)
Alkaline Phosphatase: 110 IU/L (ref 44–121)
BUN/Creatinine Ratio: 20 (ref 9–23)
BUN: 11 mg/dL (ref 6–24)
Bilirubin Total: 0.4 mg/dL (ref 0.0–1.2)
CO2: 25 mmol/L (ref 20–29)
Calcium: 9.6 mg/dL (ref 8.7–10.2)
Chloride: 100 mmol/L (ref 96–106)
Creatinine, Ser: 0.55 mg/dL — ABNORMAL LOW (ref 0.57–1.00)
Globulin, Total: 3 g/dL (ref 1.5–4.5)
Glucose: 94 mg/dL (ref 65–99)
Potassium: 5.2 mmol/L (ref 3.5–5.2)
Sodium: 137 mmol/L (ref 134–144)
Total Protein: 7.8 g/dL (ref 6.0–8.5)
eGFR: 107 mL/min/{1.73_m2} (ref >59)

## 2020-11-12 LAB — HEMOGLOBIN A1C
Est. average glucose Bld gHb Est-mCnc: 111 mg/dL
Hgb A1c MFr Bld: 5.5 % (ref 4.8–5.6)

## 2020-11-12 LAB — CBC WITH DIFFERENTIAL/PLATELET
Basophils Absolute: 0 10*3/uL (ref 0.0–0.2)
Basos: 0 %
EOS (ABSOLUTE): 0 10*3/uL (ref 0.0–0.4)
Eos: 0 %
Hematocrit: 46.9 % — ABNORMAL HIGH (ref 34.0–46.6)
Hemoglobin: 15.7 g/dL (ref 11.1–15.9)
Immature Grans (Abs): 0 10*3/uL (ref 0.0–0.1)
Immature Granulocytes: 0 %
Lymphocytes Absolute: 2.7 10*3/uL (ref 0.7–3.1)
Lymphs: 34 %
MCH: 29.3 pg (ref 26.6–33.0)
MCHC: 33.5 g/dL (ref 31.5–35.7)
MCV: 88 fL (ref 79–97)
Monocytes Absolute: 0.5 10*3/uL (ref 0.1–0.9)
Monocytes: 6 %
Neutrophils Absolute: 4.7 10*3/uL (ref 1.4–7.0)
Neutrophils: 60 %
Platelets: 306 10*3/uL (ref 150–450)
RBC: 5.35 x10E6/uL — ABNORMAL HIGH (ref 3.77–5.28)
RDW: 13.3 % (ref 11.7–15.4)
WBC: 8 10*3/uL (ref 3.4–10.8)

## 2020-11-12 LAB — TSH: TSH: 0.021 u[IU]/mL — ABNORMAL LOW (ref 0.450–4.500)

## 2020-11-18 ENCOUNTER — Other Ambulatory Visit: Payer: Self-pay

## 2020-12-18 ENCOUNTER — Other Ambulatory Visit: Payer: Self-pay

## 2020-12-18 IMAGING — DX DG SHOULDER 2+V*L*
3 series · 3 of 3 positions shown · non-contrast
Comparison: None.

CLINICAL DATA: Pain

EXAM:
LEFT SHOULDER - 2+ VIEW

[shoulder grashey]
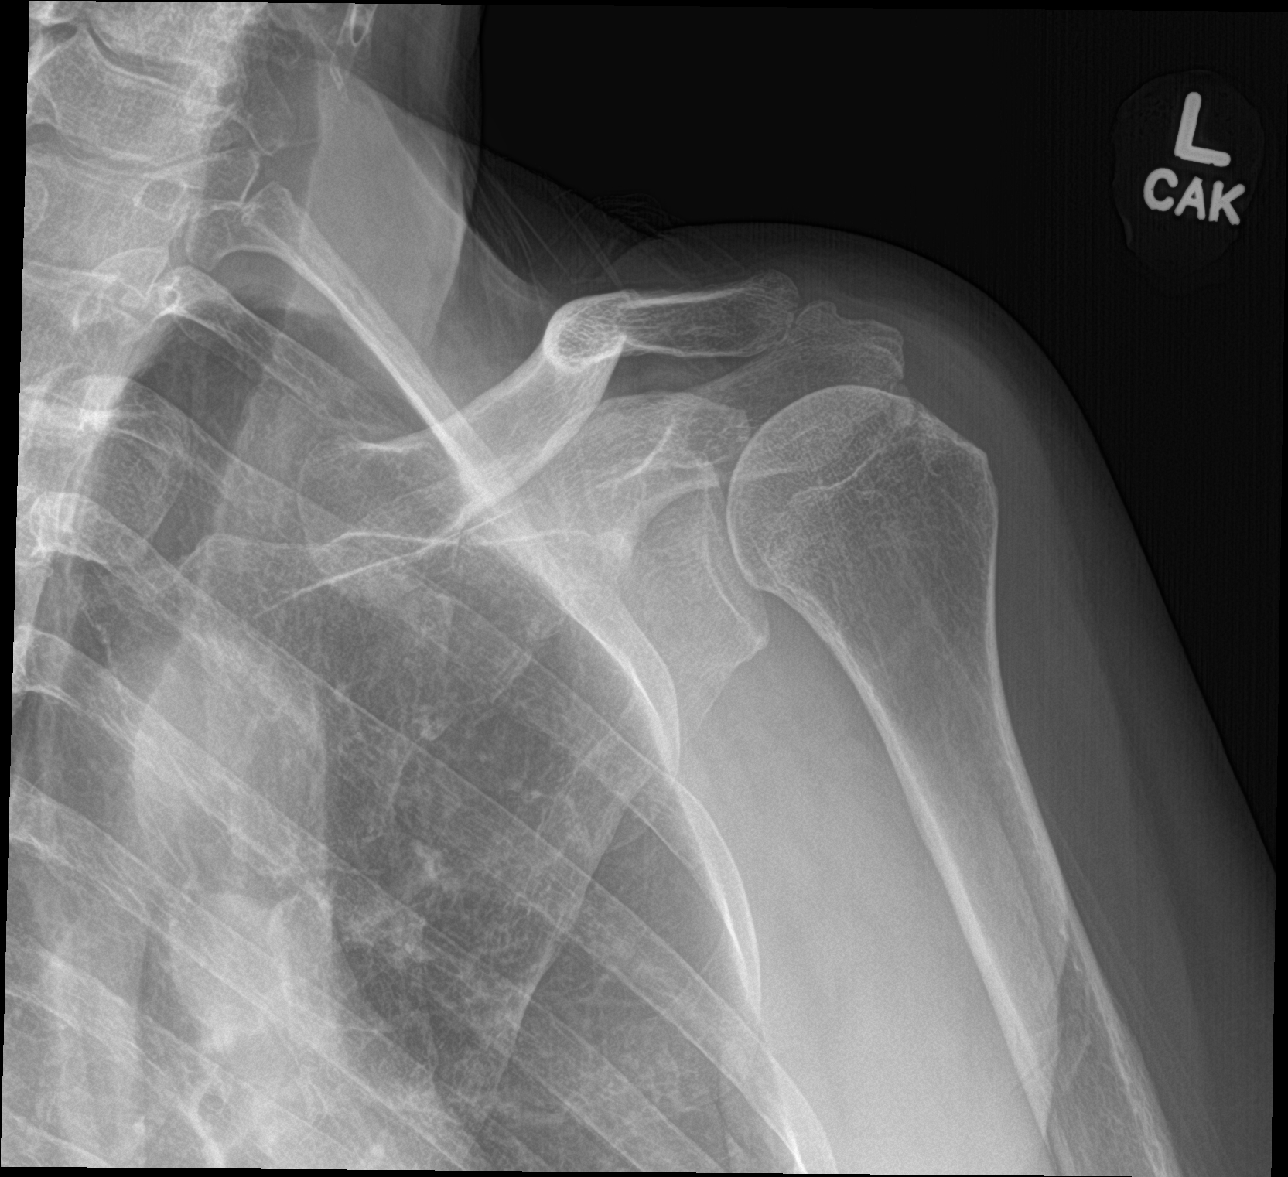

[shoulder y view]
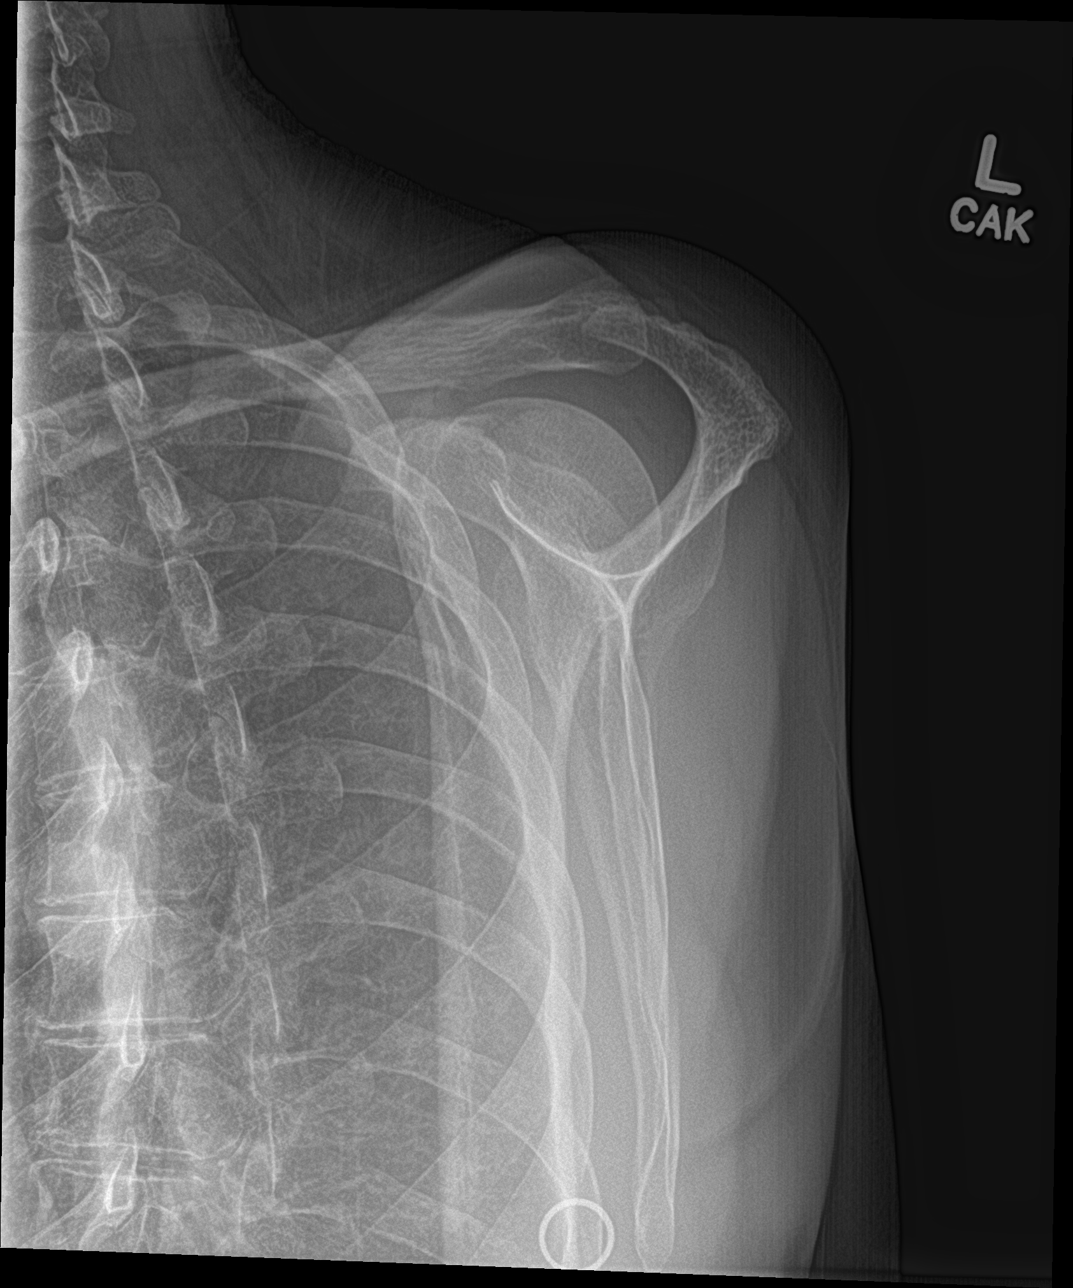

[shoulder ap neutral]
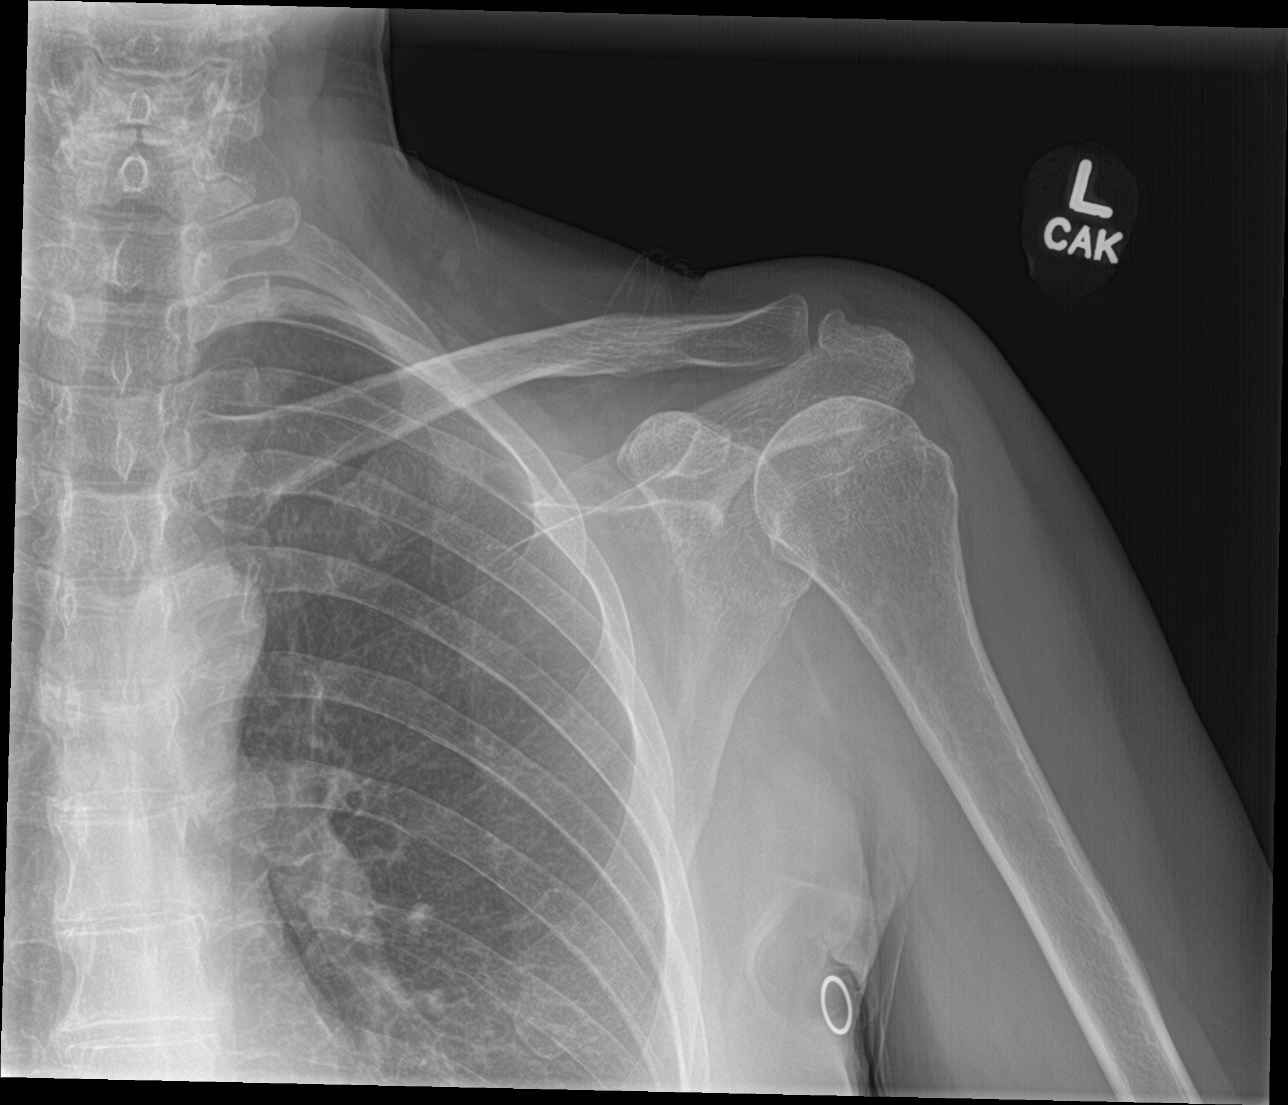

[3 of 3 positions shown; findings below may reference images not displayed]

FINDINGS: There is no evidence of fracture or dislocation. There is no
evidence of arthropathy or other focal bone abnormality. Soft
tissues are unremarkable.
IMPRESSION: Negative.

## 2020-12-23 ENCOUNTER — Other Ambulatory Visit: Payer: No Typology Code available for payment source

## 2020-12-23 ENCOUNTER — Other Ambulatory Visit: Payer: Self-pay | Admitting: Nurse Practitioner

## 2020-12-23 DIAGNOSIS — E0590 Thyrotoxicosis, unspecified without thyrotoxic crisis or storm: Secondary | ICD-10-CM

## 2020-12-25 ENCOUNTER — Other Ambulatory Visit: Payer: No Typology Code available for payment source

## 2021-02-01 ENCOUNTER — Other Ambulatory Visit: Payer: Self-pay

## 2021-03-05 ENCOUNTER — Other Ambulatory Visit: Payer: Self-pay

## 2021-03-09 ENCOUNTER — Other Ambulatory Visit: Payer: Self-pay

## 2021-03-09 ENCOUNTER — Other Ambulatory Visit: Payer: Self-pay | Admitting: Nurse Practitioner

## 2021-03-13 ENCOUNTER — Other Ambulatory Visit: Payer: Self-pay | Admitting: Nurse Practitioner

## 2021-03-13 ENCOUNTER — Other Ambulatory Visit: Payer: Self-pay

## 2021-03-15 ENCOUNTER — Other Ambulatory Visit: Payer: Self-pay

## 2021-03-15 MED ORDER — METOPROLOL TARTRATE 25 MG PO TABS
25.0000 mg | ORAL_TABLET | Freq: Three times a day (TID) | ORAL | 11 refills | Status: DC
  Filled 2021-03-15: qty 90, 30d supply, fill #0
  Filled 2021-04-30: qty 90, 30d supply, fill #1
  Filled 2021-06-15: qty 90, 30d supply, fill #2
  Filled 2021-07-30: qty 90, 30d supply, fill #3
  Filled 2021-09-10: qty 90, 30d supply, fill #4
  Filled 2021-10-25: qty 90, 30d supply, fill #5

## 2021-03-17 ENCOUNTER — Other Ambulatory Visit: Payer: Self-pay

## 2021-03-17 ENCOUNTER — Ambulatory Visit (INDEPENDENT_AMBULATORY_CARE_PROVIDER_SITE_OTHER): Payer: Self-pay | Admitting: Nurse Practitioner

## 2021-03-17 ENCOUNTER — Encounter: Payer: Self-pay | Admitting: Nurse Practitioner

## 2021-03-17 VITALS — BP 107/68 | HR 76 | Wt 141.2 lb

## 2021-03-17 DIAGNOSIS — H9201 Otalgia, right ear: Secondary | ICD-10-CM

## 2021-03-17 DIAGNOSIS — E0590 Thyrotoxicosis, unspecified without thyrotoxic crisis or storm: Secondary | ICD-10-CM

## 2021-03-17 DIAGNOSIS — E7849 Other hyperlipidemia: Secondary | ICD-10-CM

## 2021-03-17 DIAGNOSIS — R002 Palpitations: Secondary | ICD-10-CM

## 2021-03-17 DIAGNOSIS — Z720 Tobacco use: Secondary | ICD-10-CM

## 2021-03-17 DIAGNOSIS — G8929 Other chronic pain: Secondary | ICD-10-CM

## 2021-03-17 NOTE — Patient Instructions (Signed)
You were seen today in the [facility] for follow up visit. Labs were collected, results will be available via MyChart or, if abnormal, you will be contacted by clinic staff. You were prescribed medications, please take as directed. Please follow up in 6 mths for reevaluation of chronic illness.  [facility] That Accept Medicaid and/or [facility]  [facility] [address], [address]  [place], [place] [address] [phone] [email or web address]   [facility] [address] [place], [place] [address] [phone] [email or web address]   [facility] [place] [address] [place], [place] [address] Located next to [employer] Phone: [phone] [facility], [facility] [address] [place], [place] [address] Located next to [employer] Phone: [phone] [email or web address]   [facility] [address], [address] [place], [place] [address] [phone] [email or web address]   [facility] [address] [place], [place] [address] [phone] [email or web address]   [facility] [address]  [place], [place] [address] [phone] [email or web address]

## 2021-03-17 NOTE — Progress Notes (Signed)
[place], [place]  [address] Phone:  [phone]   Fax:  [phone] Subjective:   Patient ID: Jennifer Stanton, female    DOB: Feb 18, 1961, 61 y.o.   MRN: [id number]  No chief complaint on file.  HPI Jennifer Stanton 61 y.o. female  has a past medical history of Blurry vision (06/20/2014), Cephalalgia (06/20/2014), Grieving (04/2019), Heart palpitations, History of seizure (06/20/2014), Hyperthyroidism, Marijuana use (04/2019), Otitis media, Right ear pain, Seizures ([place]), and Thyroid disease. To the [facility] for follow up of chronic illnesses.   Patient states that she has only been compliant with Metoprolol due to finances. Patient states that she recently received insurance and will be able to afford medications. Refills requested. Patient does not currently monitor meals or adhere to a exercise regimen. Currently employed as a Scientist, water quality.   Requesting referral to ENT. Patient states that she has right ear pain with green drainage. States that ear pain typically resolves after removal of drainage. Has had ear pain since surgical repair 20+ years ago. Patient endorses having reduced hearing in right ear. Denies any pain during visit today.   Patient also requesting referral to opthalmology. States that she currently wears OTC reading glasses, but continues to have difficulty seeing at night.   When questioned about cigarette smoking, states that she smokes 0.25 cigarettes x 45 PPD. Not ready to quit at this time. Endorses smoking marijuana intermittently, but denies any other substance or alcohol usage. Denies any other complaints today. Denies any fatigue, chest pain, shortness of breath, HA or dizziness. Denies any blurred vision, numbness or tingling.  Denies any fatigue, chest pain, shortness of breath, HA or dizziness. Denies any blurred vision, numbness or tingling.  Past Medical History:  Diagnosis Date   Blurry vision 06/20/2014    Cephalalgia 06/20/2014   Grieving 04/2019   Heart palpitations    History of seizure 06/20/2014   Hyperthyroidism    Marijuana use 04/2019   Otitis media    Right ear pain    Seizures ([place])    Thyroid disease     Past Surgical History:  Procedure Laterality Date   INNER EAR SURGERY  at age 58   surgey on thumb  02/29/2020   TUBAL LIGATION  Jul 05, 1991    Family History  Problem Relation Age of Onset   Cancer Mother    COPD Mother    Breast cancer Mother 77   Heart disease Father    Hemochromatosis Brother     Social History   Socioeconomic History   Marital status: Divorced    Spouse name: Not on file   Number of children: Not on file   Years of education: Not on file   Highest education level: Not on file  Occupational History   Not on file  Tobacco Use   Smoking status: Every Day    Packs/day: 0.10    Types: Cigarettes   Smokeless tobacco: Never  Vaping Use   Vaping Use: Never used  Substance and Sexual Activity   Alcohol use: Yes    Comment: occ   Drug use: Yes    Types: Marijuana   Sexual activity: Not on file  Other Topics Concern   Not on file  Social History Narrative   Not on file   Social Determinants of Health   Financial Resource Strain: Not on file  Food Insecurity: Not on file  Transportation Needs: Not on file  Physical Activity:  Not on file  Stress: Not on file  Social Connections: Not on file  Intimate Partner Violence: Not on file    Outpatient Medications Prior to Visit  Medication Sig Dispense Refill   metoprolol tartrate (LOPRESSOR) 25 MG tablet TAKE 2 TABLETS BY MOUTH ONCE DAILY IN THE MORNING AND 1 TABLET ONCE DAILY IN THE EVENING. 90 tablet 11   propylthiouracil (PTU) 50 MG tablet TAKE 4 TABLETS BY MOUTH 3 TIMES DAILY. 360 tablet 11   albuterol (PROVENTIL) (2.5 MG/3ML) 0.083% nebulizer solution TAKE 3 MLS (2.5 MG TOTAL) BY NEBULIZATION EVERY 6 (SIX) HOURS AS NEEDED FOR WHEEZING OR SHORTNESS OF BREATH. 75 mL 11    amitriptyline (ELAVIL) 50 MG tablet Take 1 tablet (50 mg total) by mouth at bedtime. 30 tablet 11   amitriptyline (ELAVIL) 50 MG tablet TAKE 1 TABLET (50 MG TOTAL) BY MOUTH AT BEDTIME. 30 tablet 11   montelukast (SINGULAIR) 10 MG tablet TAKE 1 TABLET (10 MG TOTAL) BY MOUTH AT BEDTIME. (Patient not taking: Reported on 11/11/2020) 90 tablet 3   naproxen (NAPROSYN) 500 MG tablet Take 1 tablet (500 mg total) by mouth 2 (two) times daily. (Patient not taking: Reported on 11/11/2020) 30 tablet 0   VENTOLIN HFA 108 (90 Base) MCG/ACT inhaler INHALE 2 PUFFS INTO THE LUNGS EVERY 4 (FOUR) HOURS AS NEEDED FOR WHEEZING OR SHORTNESS OF BREATH (COUGH, SHORTNESS OF BREATH OR WHEEZING.). 18 g 11   traZODone (DESYREL) 50 MG tablet Take 1-2 tablets (50-100 mg total) by mouth at bedtime as needed for sleep. 60 tablet 1   No facility-administered medications prior to visit.    Allergies  Allergen Reactions   Codeine Rash    Review of Systems  Constitutional:  Negative for chills, fever and malaise/fatigue.  HENT:  Positive for ear discharge and ear pain. Negative for congestion, hearing loss, nosebleeds, sinus pain, sore throat and tinnitus.   Eyes:  Positive for blurred vision. Negative for double vision, photophobia, pain, discharge and redness.  Respiratory:  Negative for cough, shortness of breath and stridor.   Cardiovascular:  Negative for chest pain, palpitations and leg swelling.  Gastrointestinal:  Negative for abdominal pain, blood in stool, constipation, diarrhea, nausea and vomiting.  Skin: Negative.   Neurological: Negative.   Psychiatric/Behavioral:  Negative for depression. The patient is not nervous/anxious.   All other systems reviewed and are negative.     Objective:    Physical Exam Vitals reviewed.  Constitutional:      General: She is not in acute distress.    Appearance: Normal appearance. She is normal weight.  HENT:     Head: Normocephalic.     Right Ear: External ear normal.      Left Ear: Tympanic membrane, ear canal and external ear normal. There is no impacted cerumen.     Ears:     Comments: Unable to visualize right TM, canal occluded with green drainage    Nose: Nose normal. No congestion or rhinorrhea.     Mouth/Throat:     Mouth: Mucous membranes are moist.     Pharynx: Oropharynx is clear.  Eyes:     Extraocular Movements: Extraocular movements intact.     Conjunctiva/sclera: Conjunctivae normal.     Pupils: Pupils are equal, round, and reactive to light.  Neck:     Vascular: No carotid bruit.  Cardiovascular:     Rate and Rhythm: Normal rate and regular rhythm.     Pulses: Normal pulses.     Heart sounds:  Normal heart sounds.     Comments: No obvious peripheral edema Pulmonary:     Effort: Pulmonary effort is normal.     Breath sounds: Normal breath sounds.  Musculoskeletal:     Cervical back: Normal range of motion and neck supple. No rigidity or tenderness.  Lymphadenopathy:     Cervical: No cervical adenopathy.  Skin:    General: Skin is warm and dry.     Capillary Refill: Capillary refill takes less than 2 seconds.  Neurological:     General: No focal deficit present.     Mental Status: She is alert and oriented to person, place, and time.  Psychiatric:        Mood and Affect: Mood normal.        Behavior: Behavior normal.        Thought Content: Thought content normal.        Judgment: Judgment normal.    BP 107/68    Pulse 76    Wt 141 lb 3.2 oz (64 kg)    SpO2 99%    BMI 22.79 kg/m  Wt Readings from Last 3 Encounters:  03/17/21 141 lb 3.2 oz (64 kg)  11/11/20 146 lb (66.2 kg)  03/13/20 148 lb 12.8 oz (67.5 kg)    Immunization History  Administered Date(s) Administered   Influenza,inj,Quad PF,6+ Mos 04/28/2017, 02/13/2018   PFIZER(Purple Top)SARS-COV-2 Vaccination 07/10/2019, 08/01/2019, 05/13/2020   Tdap 01/01/2013    Diabetic Foot Exam - Simple   No data filed     Lab Results  Component Value Date   TSH 0.021 (L)  11/11/2020   Lab Results  Component Value Date   WBC 8.0 11/11/2020   HGB 15.7 11/11/2020   HCT 46.9 (H) 11/11/2020   MCV 88 11/11/2020   PLT 306 11/11/2020   Lab Results  Component Value Date   NA 137 11/11/2020   K 5.2 11/11/2020   CO2 25 11/11/2020   GLUCOSE 94 11/11/2020   BUN 11 11/11/2020   CREATININE 0.55 (L) 11/11/2020   BILITOT 0.4 11/11/2020   ALKPHOS 110 11/11/2020   AST 21 11/11/2020   ALT 23 11/11/2020   PROT 7.8 11/11/2020   ALBUMIN 4.8 11/11/2020   CALCIUM 9.6 11/11/2020   ANIONGAP 7 02/14/2017   EGFR 107 11/11/2020   Lab Results  Component Value Date   CHOL 223 (H) 11/11/2020   CHOL 153 10/18/2019   CHOL 193 10/16/2018   Lab Results  Component Value Date   HDL 59 11/11/2020   HDL 51 10/18/2019   HDL 56 10/16/2018   Lab Results  Component Value Date   LDLCALC 132 (H) 11/11/2020   LDLCALC 89 10/18/2019   LDLCALC 118 (H) 10/16/2018   Lab Results  Component Value Date   TRIG 182 (H) 11/11/2020   TRIG 64 10/18/2019   TRIG 93 10/16/2018   Lab Results  Component Value Date   CHOLHDL 3.8 11/11/2020   CHOLHDL 3.0 10/18/2019   CHOLHDL 3.4 10/16/2018   Lab Results  Component Value Date   HGBA1C 5.5 11/11/2020   HGBA1C 5.3 04/19/2019   HGBA1C 4.9 04/17/2018       Assessment & Plan:   Problem List Items Addressed This Visit       Endocrine   Hyperthyroidism   Relevant Orders   TSH+T4F+T3Free Encouraged continued compliance to medications Medications refilled      Other   Palpitations - Primary Encouraged continued compliance to medications Medications refilled   Other Visit Diagnoses  Other hyperlipidemia       Relevant Orders   Lipid panel Encouraged continued diet and exercise efforts  Encouraged continued compliance with medication     Chronic right ear pain       Relevant Orders   Ambulatory referral to ENT Informed to take OTC medications as needed for pain  Informed to complete follow up with specialist for  further evaluation and  management of pain    Tobacco abuse     Counseled on smoking cessation  Patient given referral list to opthalmology    Follow up in 6 mths for reevaluation of chronic illness, sooner as needed    I have discontinued Kelina L. Rosenburg's traZODone. I am also having her maintain her Ventolin HFA, naproxen, amitriptyline, albuterol, montelukast, amitriptyline, propylthiouracil, and metoprolol tartrate.  No orders of the defined types were placed in this encounter.    Teena Dunk, NP

## 2021-03-18 ENCOUNTER — Other Ambulatory Visit: Payer: Self-pay

## 2021-03-18 LAB — LIPID PANEL
Chol/HDL Ratio: 3.2 ratio (ref 0.0–4.4)
Cholesterol, Total: 184 mg/dL (ref 100–199)
HDL: 58 mg/dL (ref >39)
LDL Chol Calc (NIH): 112 mg/dL — ABNORMAL HIGH (ref 0–99)
Triglycerides: 78 mg/dL (ref 0–149)
VLDL Cholesterol Cal: 14 mg/dL (ref 5–40)

## 2021-03-18 LAB — TSH+T4F+T3FREE
Free T4: 3.93 ng/dL — ABNORMAL HIGH (ref 0.82–1.77)
T3, Free: 8.6 pg/mL — ABNORMAL HIGH (ref 2.0–4.4)
TSH: <0.005 u[IU]/mL — ABNORMAL LOW (ref 0.450–4.500)

## 2021-03-25 ENCOUNTER — Other Ambulatory Visit: Payer: Self-pay

## 2021-04-30 ENCOUNTER — Other Ambulatory Visit: Payer: Self-pay

## 2021-06-15 ENCOUNTER — Other Ambulatory Visit: Payer: Self-pay

## 2021-06-17 ENCOUNTER — Other Ambulatory Visit: Payer: Self-pay

## 2021-07-30 ENCOUNTER — Other Ambulatory Visit: Payer: Self-pay

## 2021-08-03 ENCOUNTER — Other Ambulatory Visit: Payer: Self-pay

## 2021-08-03 ENCOUNTER — Encounter (HOSPITAL_BASED_OUTPATIENT_CLINIC_OR_DEPARTMENT_OTHER): Payer: Self-pay | Admitting: Obstetrics and Gynecology

## 2021-08-03 ENCOUNTER — Emergency Department (HOSPITAL_BASED_OUTPATIENT_CLINIC_OR_DEPARTMENT_OTHER)
Admission: EM | Admit: 2021-08-03 | Discharge: 2021-08-03 | Disposition: A | Discharge status: Home / Self Care | Payer: 59 | Attending: Emergency Medicine | Admitting: Emergency Medicine

## 2021-08-03 ENCOUNTER — Other Ambulatory Visit (HOSPITAL_BASED_OUTPATIENT_CLINIC_OR_DEPARTMENT_OTHER): Payer: Self-pay

## 2021-08-03 DIAGNOSIS — H60501 Unspecified acute noninfective otitis externa, right ear: Secondary | ICD-10-CM | POA: Diagnosis not present

## 2021-08-03 DIAGNOSIS — L559 Sunburn, unspecified: Secondary | ICD-10-CM | POA: Diagnosis present

## 2021-08-03 DIAGNOSIS — L550 Sunburn of first degree: Secondary | ICD-10-CM | POA: Diagnosis not present

## 2021-08-03 MED ORDER — DICLOFENAC SODIUM 1 % EX GEL
1.0000 "application " | Freq: Two times a day (BID) | CUTANEOUS | 0 refills | Status: DC | PRN
  Filled 2021-08-03: qty 100, 50d supply, fill #0

## 2021-08-03 MED ORDER — HYDROCORTISONE-ACETIC ACID 1-2 % OT SOLN
3.0000 [drp] | Freq: Two times a day (BID) | OTIC | 0 refills | Status: DC
  Filled 2021-08-03: qty 10, 10d supply, fill #0

## 2021-08-03 MED ORDER — CIPROFLOXACIN-DEXAMETHASONE 0.3-0.1 % OT SUSP
4.0000 [drp] | Freq: Two times a day (BID) | OTIC | 0 refills | Status: DC
  Filled 2021-08-03: qty 7.5, 19d supply, fill #0

## 2021-08-03 MED ORDER — NEOMYCIN-POLYMYXIN-DEXAMETH 3.5-10000-0.1 OP SUSP
2.0000 [drp] | Freq: Four times a day (QID) | OPHTHALMIC | 0 refills | Status: DC
  Filled 2021-08-03: qty 5, 13d supply, fill #0

## 2021-08-03 NOTE — Discharge Instructions (Addendum)
Try over-the-counter remedies for sunburn including aloe vera, hypoallergenic lotion.  Avoid calamine lotion secondary to your allergy.  Continue take ibuprofen at home for pain.  You can also try the topical diclofenac/Voltaren cream.  Apply this to affected area up to twice per day.  Apply just left this cover affected areas.  Continue to drink plenty of fluids for rehydration.  Avoid sun exposure if possible.  Continue to wear sunscreen when exposed to sun.  I have provided an information packet regarding sunburns for you to read if you desire. I will also prescribe Ciprodex for your right outer ear infection.  Please follow-up with EENT for further evaluation/treatment of your symptoms.  Please do not hesitate to return to emergency department for worrisome signs and symptoms we discussed become apparent.
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
## 2021-08-03 NOTE — ED Triage Notes (Signed)
Patient reports to the ER for sunburn after accidentally using bronzer in [employer] instead of SPF. Patient reports she is concerned about sun poisoning. Patient also reports right sided ear pain. Patient reports she has been having increasing ear pain.

## 2021-09-06 ENCOUNTER — Other Ambulatory Visit: Payer: Self-pay

## 2021-09-06 MED ORDER — VENLAFAXINE HCL ER 37.5 MG PO CP24
ORAL_CAPSULE | ORAL | 0 refills | Status: DC
  Filled 2021-09-06: qty 7, 7d supply, fill #0

## 2021-09-06 MED ORDER — VENLAFAXINE HCL ER 75 MG PO CP24
ORAL_CAPSULE | ORAL | 0 refills | Status: DC
  Filled 2021-09-06: qty 21, 21d supply, fill #0

## 2021-09-10 ENCOUNTER — Other Ambulatory Visit: Payer: Self-pay

## 2021-09-14 ENCOUNTER — Ambulatory Visit: Payer: Self-pay | Admitting: Nurse Practitioner

## 2021-09-23 ENCOUNTER — Ambulatory Visit (INDEPENDENT_AMBULATORY_CARE_PROVIDER_SITE_OTHER): Payer: 59 | Admitting: Nurse Practitioner

## 2021-09-23 ENCOUNTER — Encounter: Payer: Self-pay | Admitting: Nurse Practitioner

## 2021-09-23 VITALS — BP 104/67 | HR 70 | Temp 98.0°F | Ht 66.0 in | Wt 131.2 lb

## 2021-09-23 DIAGNOSIS — E0590 Thyrotoxicosis, unspecified without thyrotoxic crisis or storm: Secondary | ICD-10-CM

## 2021-09-23 NOTE — Patient Instructions (Signed)
1. Hyperthyroidism  - Ambulatory referral to Endocrinology - Thyroid Panel With TSH - CBC - Comprehensive metabolic panel   Follow up:  Follow up in 6 months or sooner

## 2021-09-23 NOTE — Progress Notes (Signed)
@Patient  ID: , female    DOB: [birth year], 61 y.o.   MRN: [id number]  Chief Complaint  Patient presents with   Follow-up    Pt is here for 6 months follow up visit. Pt is requesting a refill on PTU, amitriptyline, metoprolol tartrate    Referring provider: [id number], NP   HPI  Jennifer Stanton 61 y.o. female  has a past medical history of Blurry vision (06/20/2014), Cephalalgia (06/20/2014), Grieving (04/2019), Heart palpitations, History of seizure (06/20/2014), Hyperthyroidism, Marijuana use (04/2019), Otitis media, Right ear pain, Seizures ([facility]), and Thyroid disease. To the [facility] for follow up of chronic illnesses.   Patient presents today for follow-up on her chronic conditions.  Overall she states that she has been doing well.  She does need refills on medications.  She denies any new issues or concerns today.  Patient does have a history of hyperthyroidism and thyroid panel has been abnormal the last couple checks with [facility].  We will recheck this today and refer patient to endocrinology for further management.  Denies f/c/s, n/v/d, hemoptysis, PND, leg swelling Denies chest pain or edema    Allergies  Allergen Reactions   Codeine Rash    Immunization History  Administered Date(s) Administered   Influenza,inj,Quad PF,6+ Mos 04/28/2017, 02/13/2018   PFIZER(Purple Top)SARS-COV-2 Vaccination 07/10/2019, 08/01/2019, 05/13/2020   Tdap 01/01/2013    Past Medical History:  Diagnosis Date   Blurry vision 06/20/2014   Cephalalgia 06/20/2014   Grieving 04/2019   Heart palpitations    History of seizure 06/20/2014   Hyperthyroidism    Marijuana use 04/2019   Otitis media    Right ear pain    Seizures ([facility])    Thyroid disease     Tobacco History: Social History   Tobacco Use  Smoking Status Every Day   Packs/day: 0.10   Types: Cigarettes  Smokeless Tobacco Never   Ready to quit: Not Answered Counseling given: Not  Answered   Outpatient Encounter Medications as of 09/23/2021  Medication Sig   amitriptyline (ELAVIL) 50 MG tablet Take 1 tablet (50 mg total) by mouth at bedtime.   amitriptyline (ELAVIL) 50 MG tablet TAKE 1 TABLET (50 MG TOTAL) BY MOUTH AT BEDTIME.   diclofenac Sodium (VOLTAREN) 1 % GEL 1 application. 2 (two) times daily as needed.   metoprolol tartrate (LOPRESSOR) 25 MG tablet TAKE 2 TABLETS BY MOUTH ONCE DAILY IN THE MORNING AND 1 TABLET ONCE DAILY IN THE EVENING.   neomycin-polymyxin b-dexamethasone (MAXITROL) 3.5-10000-0.1 SUSP Place 2 drops into the right ear every 6 (six) hours.   propylthiouracil (PTU) 50 MG tablet TAKE 4 TABLETS BY MOUTH 3 TIMES DAILY.   VENTOLIN HFA 108 (90 Base) MCG/ACT inhaler INHALE 2 PUFFS INTO THE LUNGS EVERY 4 (FOUR) HOURS AS NEEDED FOR WHEEZING OR SHORTNESS OF BREATH (COUGH, SHORTNESS OF BREATH OR WHEEZING.).   albuterol (PROVENTIL) (2.5 MG/3ML) 0.083% nebulizer solution TAKE 3 MLS (2.5 MG TOTAL) BY NEBULIZATION EVERY 6 (SIX) HOURS AS NEEDED FOR WHEEZING OR SHORTNESS OF BREATH.   montelukast (SINGULAIR) 10 MG tablet TAKE 1 TABLET (10 MG TOTAL) BY MOUTH AT BEDTIME. (Patient not taking: Reported on 11/11/2020)   naproxen (NAPROSYN) 500 MG tablet Take 1 tablet (500 mg total) by mouth 2 (two) times daily. (Patient not taking: Reported on 11/11/2020)   venlafaxine XR (EFFEXOR-XR) 37.5 MG 24 hr capsule take 1 capsule by mouth daily (Patient not taking: Reported on 09/23/2021)   venlafaxine XR (EFFEXOR-XR) 75 MG 24 hr capsule  Take 1 capsule by mouth once a day start after 1 week of 37.5mg  (Patient not taking: Reported on 09/23/2021)   No facility-administered encounter medications on file as of 09/23/2021.     Review of Systems  Review of Systems  Constitutional: Negative.   HENT: Negative.    Cardiovascular: Negative.   Gastrointestinal: Negative.   Allergic/Immunologic: Negative.   Neurological: Negative.   Psychiatric/Behavioral: Negative.         Physical  Exam  BP 104/67 (BP Location: Left Arm, Patient Position: Sitting, Cuff Size: Normal)   Pulse 70   Temp 98 F (36.7 C)   Ht 5\' 6"  (1.676 m)   Wt 131 lb 4 oz (59.5 kg)   SpO2 96%   BMI 21.18 kg/m   Wt Readings from Last 5 Encounters:  09/23/21 131 lb 4 oz (59.5 kg)  08/03/21 145 lb (65.8 kg)  03/17/21 141 lb 3.2 oz (64 kg)  11/11/20 146 lb (66.2 kg)  03/13/20 148 lb 12.8 oz (67.5 kg)     Physical Exam Vitals and nursing note reviewed.  Constitutional:      General: She is not in acute distress.    Appearance: She is well-developed.  Cardiovascular:     Rate and Rhythm: Normal rate and regular rhythm.  Pulmonary:     Effort: Pulmonary effort is normal.     Breath sounds: Normal breath sounds.  Neurological:     Mental Status: She is alert and oriented to person, place, and time.       Assessment & Plan:   Hyperthyroidism - Ambulatory referral to Endocrinology - Thyroid Panel With TSH - CBC - Comprehensive metabolic panel   Follow up:  Follow up in 6 months or sooner     05/11/20, NP 09/30/2021

## 2021-09-24 LAB — CBC
Hematocrit: 43.2 % (ref 34.0–46.6)
Hemoglobin: 14.6 g/dL (ref 11.1–15.9)
MCH: 28.2 pg (ref 26.6–33.0)
MCHC: 33.8 g/dL (ref 31.5–35.7)
MCV: 84 fL (ref 79–97)
Platelets: 214 10*3/uL (ref 150–450)
RBC: 5.17 x10E6/uL (ref 3.77–5.28)
RDW: 12.9 % (ref 11.7–15.4)
WBC: 4.4 10*3/uL (ref 3.4–10.8)

## 2021-09-24 LAB — COMPREHENSIVE METABOLIC PANEL
ALT: 13 IU/L (ref 0–32)
AST: 20 IU/L (ref 0–40)
Albumin/Globulin Ratio: 1.5 (ref 1.2–2.2)
Albumin: 4.1 g/dL (ref 3.8–4.9)
Alkaline Phosphatase: 112 IU/L (ref 44–121)
BUN/Creatinine Ratio: 19 (ref 9–23)
BUN: 11 mg/dL (ref 6–24)
Bilirubin Total: 0.4 mg/dL (ref 0.0–1.2)
CO2: 21 mmol/L (ref 20–29)
Calcium: 10.1 mg/dL (ref 8.7–10.2)
Chloride: 101 mmol/L (ref 96–106)
Creatinine, Ser: 0.59 mg/dL (ref 0.57–1.00)
Globulin, Total: 2.7 g/dL (ref 1.5–4.5)
Glucose: 89 mg/dL (ref 70–99)
Potassium: 4.6 mmol/L (ref 3.5–5.2)
Sodium: 136 mmol/L (ref 134–144)
Total Protein: 6.8 g/dL (ref 6.0–8.5)
eGFR: 104 mL/min/{1.73_m2} (ref >59)

## 2021-09-24 LAB — THYROID PANEL WITH TSH
Free Thyroxine Index: 5 — ABNORMAL HIGH (ref 1.2–4.9)
T3 Uptake Ratio: 38 % (ref 24–39)
T4, Total: 13.1 ug/dL — ABNORMAL HIGH (ref 4.5–12.0)
TSH: <0.005 u[IU]/mL — ABNORMAL LOW (ref 0.450–4.500)

## 2021-09-30 ENCOUNTER — Encounter: Payer: Self-pay | Admitting: Nurse Practitioner

## 2021-09-30 NOTE — Assessment & Plan Note (Signed)
-   Ambulatory referral to Endocrinology - Thyroid Panel With TSH - CBC - Comprehensive metabolic panel   Follow up:  Follow up in 6 months or sooner

## 2021-10-25 ENCOUNTER — Other Ambulatory Visit: Payer: Self-pay

## 2021-10-26 ENCOUNTER — Other Ambulatory Visit: Payer: Self-pay | Admitting: Nurse Practitioner

## 2021-10-27 ENCOUNTER — Other Ambulatory Visit: Payer: Self-pay

## 2021-10-27 MED ORDER — ALBUTEROL SULFATE (2.5 MG/3ML) 0.083% IN NEBU
3.0000 mL | INHALATION_SOLUTION | Freq: Four times a day (QID) | RESPIRATORY_TRACT | 11 refills | Status: DC | PRN
  Filled 2021-10-27: qty 75, 7d supply, fill #0
  Filled 2021-12-10: qty 90, 8d supply, fill #0

## 2021-10-28 ENCOUNTER — Other Ambulatory Visit: Payer: Self-pay

## 2021-10-29 ENCOUNTER — Telehealth: Payer: Self-pay | Admitting: Nurse Practitioner

## 2021-10-29 ENCOUNTER — Other Ambulatory Visit: Payer: Self-pay | Admitting: Nurse Practitioner

## 2021-10-29 ENCOUNTER — Other Ambulatory Visit: Payer: Self-pay

## 2021-10-29 DIAGNOSIS — J449 Chronic obstructive pulmonary disease, unspecified: Secondary | ICD-10-CM

## 2021-10-29 MED ORDER — ALBUTEROL SULFATE HFA 108 (90 BASE) MCG/ACT IN AERS
INHALATION_SPRAY | RESPIRATORY_TRACT | 11 refills | Status: DC
  Filled 2021-10-29: qty 6.7, 25d supply, fill #0
  Filled 2021-12-09: qty 6.7, 20d supply, fill #0
  Filled 2021-12-10: qty 6.7, 25d supply, fill #0
  Filled 2021-12-23 – 2021-12-30 (×3): qty 6.7, 25d supply, fill #1
  Filled 2022-01-10: qty 6.7, 16d supply, fill #2
  Filled 2022-01-30 – 2022-02-04 (×2): qty 6.7, 25d supply, fill #2
  Filled 2022-03-08: qty 6.7, 25d supply, fill #3
  Filled 2022-03-10: qty 6.7, 16d supply, fill #3
  Filled 2022-03-24: qty 6.7, 17d supply, fill #3

## 2021-10-29 NOTE — Telephone Encounter (Signed)
Patient picked up Albuterol prescription today, but states that it is for the solution, not inhaler. She does not have a nebulizer. Please advise

## 2021-11-04 ENCOUNTER — Other Ambulatory Visit: Payer: Self-pay

## 2021-11-04 DIAGNOSIS — G4709 Other insomnia: Secondary | ICD-10-CM

## 2021-11-05 ENCOUNTER — Other Ambulatory Visit: Payer: Self-pay

## 2021-11-05 ENCOUNTER — Telehealth: Payer: Self-pay

## 2021-11-05 NOTE — Telephone Encounter (Signed)
Pt called and said she confirmed her pharmacy to   [facility] on [address]

## 2021-11-10 ENCOUNTER — Telehealth: Payer: Self-pay

## 2021-11-10 MED ORDER — AMITRIPTYLINE HCL 50 MG PO TABS: ORAL_TABLET | Freq: Every day | ORAL | 11 refills | Status: DC

## 2021-11-10 MED ORDER — METOPROLOL TARTRATE 25 MG PO TABS
25.0000 mg | ORAL_TABLET | Freq: Three times a day (TID) | ORAL | 11 refills | Status: DC
Start: 2021-11-10 — End: 2022-05-03

## 2021-11-10 MED ORDER — PROPYLTHIOURACIL 50 MG PO TABS
ORAL_TABLET | ORAL | 11 refills | Status: DC
Start: 2021-11-10 — End: 2022-01-11

## 2021-11-10 NOTE — Telephone Encounter (Signed)
No additional notes

## 2021-11-12 NOTE — Telephone Encounter (Signed)
No additional notes needed  

## 2021-12-09 ENCOUNTER — Other Ambulatory Visit: Payer: Self-pay | Admitting: Nurse Practitioner

## 2021-12-10 ENCOUNTER — Other Ambulatory Visit (HOSPITAL_COMMUNITY): Payer: Self-pay

## 2021-12-10 ENCOUNTER — Other Ambulatory Visit: Payer: Self-pay | Admitting: Nurse Practitioner

## 2021-12-10 ENCOUNTER — Other Ambulatory Visit: Payer: Self-pay

## 2021-12-10 ENCOUNTER — Telehealth: Payer: Self-pay

## 2021-12-10 MED ORDER — ALBUTEROL SULFATE (2.5 MG/3ML) 0.083% IN NEBU
3.0000 mL | INHALATION_SOLUTION | Freq: Four times a day (QID) | RESPIRATORY_TRACT | 11 refills | Status: DC | PRN
  Filled 2021-12-10: qty 75, 7d supply, fill #0
  Filled 2021-12-10: qty 75, 15d supply, fill #0

## 2021-12-10 MED ORDER — MONTELUKAST SODIUM 10 MG PO TABS
ORAL_TABLET | Freq: Every day | ORAL | 3 refills | Status: DC
  Filled 2021-12-10: qty 58, 58d supply, fill #0
  Filled 2021-12-10: qty 32, 32d supply, fill #0

## 2021-12-10 NOTE — Telephone Encounter (Signed)
No additional notes needed  

## 2021-12-11 ENCOUNTER — Other Ambulatory Visit (HOSPITAL_COMMUNITY): Payer: Self-pay

## 2021-12-16 ENCOUNTER — Other Ambulatory Visit: Payer: Self-pay

## 2021-12-16 ENCOUNTER — Emergency Department (HOSPITAL_BASED_OUTPATIENT_CLINIC_OR_DEPARTMENT_OTHER)
Admission: EM | Admit: 2021-12-16 | Discharge: 2021-12-17 | Disposition: A | Discharge status: Home / Self Care | Payer: Commercial Managed Care - HMO | Attending: Emergency Medicine | Admitting: Emergency Medicine

## 2021-12-16 ENCOUNTER — Encounter (HOSPITAL_BASED_OUTPATIENT_CLINIC_OR_DEPARTMENT_OTHER): Payer: Self-pay | Admitting: Emergency Medicine

## 2021-12-16 ENCOUNTER — Other Ambulatory Visit (HOSPITAL_COMMUNITY): Payer: Self-pay

## 2021-12-16 ENCOUNTER — Ambulatory Visit
Admission: RE | Admit: 2021-12-16 | Discharge: 2021-12-16 | Disposition: A | Discharge status: Home / Self Care | Payer: Commercial Managed Care - HMO | Source: Ambulatory Visit | Attending: Emergency Medicine | Admitting: Emergency Medicine

## 2021-12-16 ENCOUNTER — Emergency Department (HOSPITAL_BASED_OUTPATIENT_CLINIC_OR_DEPARTMENT_OTHER): Payer: Commercial Managed Care - HMO | Admitting: Radiology

## 2021-12-16 VITALS — BP 118/73 | HR 78 | Temp 98.2°F | Resp 16

## 2021-12-16 DIAGNOSIS — J449 Chronic obstructive pulmonary disease, unspecified: Secondary | ICD-10-CM | POA: Diagnosis not present

## 2021-12-16 DIAGNOSIS — W1840XA Slipping, tripping and stumbling without falling, unspecified, initial encounter: Secondary | ICD-10-CM | POA: Insufficient documentation

## 2021-12-16 DIAGNOSIS — S76012A Strain of muscle, fascia and tendon of left hip, initial encounter: Secondary | ICD-10-CM

## 2021-12-16 DIAGNOSIS — M25552 Pain in left hip: Secondary | ICD-10-CM

## 2021-12-16 DIAGNOSIS — S79912A Unspecified injury of left hip, initial encounter: Secondary | ICD-10-CM | POA: Diagnosis present

## 2021-12-16 HISTORY — DX: Chronic obstructive pulmonary disease, unspecified: J44.9

## 2021-12-16 NOTE — ED Notes (Signed)
Pt asked for update from staff, under the impression from conversation with UC that this visit would only take 45 minutes to complete. UC is currently closed and unclear if patient was supposed to be registered for imaging only or for ER visit. Encouraged patient to stay and be seen by ER provider given this.  Offered pain medication from nursing orders but pt is unable to take tylenol or advil and is hesitant to take percocet given she has never taken this or medications like this before. Provided patient with heat pack to provide adjunct pain intervention. Ambulatory, A&Ox4

## 2021-12-16 NOTE — ED Provider Notes (Signed)
[facility]    CSN: [id number] Arrival date & time: 12/16/21  1652    HISTORY   Chief Complaint  Patient presents with   Leg Pain    Entered by patient   HPI Jennifer Stanton is a pleasant, 61 y.o. female who presents to urgent care today. Pt c/o left groin pain that shoots to her lower back, buttock and down her left leg that began about 2 weeks ago.  Has been taking ibuprofen with intermittent relief of pain.     Leg Pain  Past Medical History:  Diagnosis Date   Blurry vision 06/20/2014   Cephalalgia 06/20/2014   COPD (chronic obstructive pulmonary disease) (HCC)    Grieving 04/2019   Heart palpitations    History of seizure 06/20/2014   Hyperthyroidism    Marijuana use 04/2019   Otitis media    Right ear pain    Seizures (HCC)    Thyroid disease    Patient Active Problem List   Diagnosis Date Noted   Grieving 04/22/2019   Recent weight gain 10/16/2018   Other insomnia 10/16/2018   Right otitis media 08/13/2018   Ear pain, right 08/13/2018   Hyperthyroidism 08/13/2018   Palpitations 08/13/2018   History of seizure 06/20/2014   Blurry vision 06/20/2014   Cephalalgia 06/20/2014   Past Surgical History:  Procedure Laterality Date   INNER EAR SURGERY  at age 34   surgey on thumb  02/29/2020   TUBAL LIGATION  Jul 05, 1991   OB History     Gravida  4   Para      Term      Preterm      AB  1   Living  2      SAB      IAB  1   Ectopic      Multiple      Live Births  3          Home Medications    Prior to Admission medications   Medication Sig Start Date End Date Taking? Authorizing Provider  albuterol (PROVENTIL) (2.5 MG/3ML) 0.083% nebulizer solution INHALE 3 MLS (2.5 MG TOTAL) BY NEBULIZATION EVERY 6 (SIX) HOURS AS NEEDED FOR WHEEZING OR SHORTNESS OF BREATH. 12/10/21 12/10/22  Ivonne Andrew, NP  albuterol (VENTOLIN HFA) 108 (90 Base) MCG/ACT inhaler INHALE 2 PUFFS INTO THE LUNGS EVERY 4 (FOUR) HOURS AS NEEDED FOR  WHEEZING OR SHORTNESS OF BREATH (COUGH, SHORTNESS OF BREATH OR WHEEZING.). 10/29/21   Ivonne Andrew, NP  amitriptyline (ELAVIL) 50 MG tablet TAKE 1 TABLET (50 MG TOTAL) BY MOUTH AT BEDTIME. 11/10/21 11/10/22  Ivonne Andrew, NP  diclofenac Sodium (VOLTAREN) 1 % GEL 1 application. 2 (two) times daily as needed. 08/03/21   Sherian Maroon A, PA  metoprolol tartrate (LOPRESSOR) 25 MG tablet TAKE 2 TABLETS BY MOUTH ONCE DAILY IN THE MORNING AND 1 TABLET ONCE DAILY IN THE EVENING. 11/10/21 11/10/22  Ivonne Andrew, NP  montelukast (SINGULAIR) 10 MG tablet TAKE 1 TABLET (10 MG TOTAL) BY MOUTH AT BEDTIME. 12/10/21 12/10/22  Ivonne Andrew, NP  neomycin-polymyxin b-dexamethasone (MAXITROL) 3.5-10000-0.1 SUSP Place 2 drops into the right ear every 6 (six) hours. 08/03/21   Peter Garter, PA  propylthiouracil (PTU) 50 MG tablet TAKE 4 TABLETS BY MOUTH 3 TIMES DAILY. 11/10/21 11/10/22  Ivonne Andrew, NP    Family History Family History  Problem Relation Age of Onset   Cancer Mother    COPD  Mother    Breast cancer Mother 83   Heart disease Father    Hemochromatosis Brother    Social History Social History   Tobacco Use   Smoking status: Every Day    Packs/day: 0.10    Types: Cigarettes   Smokeless tobacco: Never  Vaping Use   Vaping Use: Never used  Substance Use Topics   Alcohol use: Yes    Comment: occ   Drug use: Yes    Types: Marijuana   Allergies   Codeine  Review of Systems Review of Systems Pertinent findings revealed after performing a 14 point review of systems has been noted in the history of present illness.  Physical Exam Triage Vital Signs ED Triage Vitals  Enc Vitals Group     BP 01/01/21 0827 (!) 147/82     Pulse Rate 01/01/21 0827 72     Resp 01/01/21 0827 18     Temp 01/01/21 0827 98.3 F (36.8 C)     Temp Source 01/01/21 0827 Oral     SpO2 01/01/21 0827 98 %     Weight --      Height --      Head Circumference --      Peak Flow --      Pain Score 01/01/21  0826 5     Pain Loc --      Pain Edu? --      Excl. in [place]? --   No data found.  Updated Vital Signs BP 118/73 (BP Location: Right Arm)   Pulse 78   Temp 98.2 F (36.8 C) (Oral)   Resp 16   SpO2 96%   Physical Exam  Visual Acuity Right Eye Distance:   Left Eye Distance:   Bilateral Distance:    Right Eye Near:   Left Eye Near:    Bilateral Near:     UC Couse / Diagnostics / Procedures:     Radiology DG Hip Unilat With Pelvis 2-3 Views Left  Result Date: 12/16/2021 CLINICAL DATA:  Left leg pain after slipping on a floor EXAM: DG HIP (WITH OR WITHOUT PELVIS) 2-3V LEFT COMPARISON:  None Available. FINDINGS: There is no evidence of hip fracture or dislocation. There is no evidence of arthropathy or other focal bone abnormality. IMPRESSION: Negative. Electronically Signed   By: Merilyn Baba M.D.   On: 12/16/2021 19:21    Procedures Procedures (including critical care time) EKG  Pending results:  Labs Reviewed - No data to display  Medications Ordered in UC: Medications - No data to display  UC Diagnoses / Final Clinical Impressions(s)   I have reviewed the triage vital signs and the nursing notes.  Pertinent labs & imaging results that were available during my care of the patient were reviewed by me and considered in my medical decision making (see chart for details).    Final diagnoses:  Acute hip pain, left   Patient was advised of the results of her x-ray was performed at an outside facility.  Patient was provided with a prescription for meloxicam.  ED Prescriptions   None    PDMP not reviewed this encounter.  Pending results:  Labs Reviewed - No data to display  Discharge Instructions: Discharge Instructions   None     Disposition Upon Discharge:  Condition: stable for discharge home  Patient presented with an acute illness with associated systemic symptoms and significant discomfort requiring urgent management. In my opinion, this is a condition  that a prudent lay person (  someone who possesses an average knowledge of health and medicine) may potentially expect to result in complications if not addressed urgently such as respiratory distress, impairment of bodily function or dysfunction of bodily organs.   Routine symptom specific, illness specific and/or disease specific instructions were discussed with the patient and/or caregiver at length.   As such, the patient has been evaluated and assessed, work-up was performed and treatment was provided in alignment with urgent care protocols and evidence based medicine.  Patient/parent/caregiver has been advised that the patient may require follow up for further testing and treatment if the symptoms continue in spite of treatment, as clinically indicated and appropriate.  Patient/parent/caregiver has been advised to return to the [facility] or PCP if no better; to PCP or the Emergency Department if new signs and symptoms develop, or if the current signs or symptoms continue to change or worsen for further workup, evaluation and treatment as clinically indicated and appropriate  The patient will follow up with their current PCP if and as advised. If the patient does not currently have a PCP we will assist them in obtaining one.   The patient may need specialty follow up if the symptoms continue, in spite of conservative treatment and management, for further workup, evaluation, consultation and treatment as clinically indicated and appropriate.   Patient/parent/caregiver verbalized understanding and agreement of plan as discussed.  All questions were addressed during visit.  Please see discharge instructions below for further details of plan.  This office note has been dictated using Teaching laboratory technician.  Unfortunately, this method of dictation can sometimes lead to typographical or grammatical errors.  I apologize for your inconvenience in advance if this occurs.  Please do not hesitate to  reach out to me if clarification is needed.      Theadora Rama Scales, PA-C 12/18/21 1242

## 2021-12-16 NOTE — ED Triage Notes (Signed)
Left leg pain after slipping on slick floor. Happened 2 weeks ago. Pain continues without relief. Feels like it was getting worse

## 2021-12-16 NOTE — ED Triage Notes (Addendum)
Pt c/o left groin pain that shoots to her lower back, buttock and down the left leg.    Started: 2 weeks ago  Home interventions; ibuprofen

## 2021-12-17 ENCOUNTER — Encounter (HOSPITAL_BASED_OUTPATIENT_CLINIC_OR_DEPARTMENT_OTHER): Payer: Self-pay | Admitting: Emergency Medicine

## 2021-12-17 ENCOUNTER — Other Ambulatory Visit: Payer: Self-pay

## 2021-12-17 MED ORDER — MELOXICAM 15 MG PO TABS
15.0000 mg | ORAL_TABLET | Freq: Every day | ORAL | 0 refills | Status: DC | PRN
  Filled 2021-12-17: qty 20, 20d supply, fill #0

## 2021-12-17 MED ORDER — NAPROXEN 250 MG PO TABS
500.0000 mg | ORAL_TABLET | Freq: Once | ORAL | Status: DC
  Filled 2021-12-17: qty 2

## 2021-12-17 NOTE — ED Provider Notes (Signed)
[facility] Provider Note: Georgena Spurling, MD, FACEP  CSN: [id number] MRN: [id number] ARRIVAL: 12/16/21 at [place]: [place]  Hip Pain   HISTORY OF PRESENT ILLNESS  12/17/21 12:10 AM Jennifer Stanton is a 61 y.o. female who did a "split" accidentally about 2 weeks ago.  In the process her left hip was hyperabducted.  Since then she has had pain in her left hip primarily in the groin fold but radiating to the left to a lesser extent.  She has been taking ibuprofen but states it aggravates her COPD she stopped taking it 2 days ago.  As a consequence her pain has worsened.  The pain is moderate to severe and worse with movement or ambulation.   Past Medical History:  Diagnosis Date   Blurry vision 06/20/2014   Cephalalgia 06/20/2014   COPD (chronic obstructive pulmonary disease) ([place])    Grieving 04/2019   Heart palpitations    History of seizure 06/20/2014   Hyperthyroidism    Marijuana use 04/2019   Otitis media    Right ear pain    Seizures ([place])    Thyroid disease     Past Surgical History:  Procedure Laterality Date   INNER EAR SURGERY  at age 14   surgey on thumb  02/29/2020   TUBAL LIGATION  Jul 05, 1991    Family History  Problem Relation Age of Onset   Cancer Mother    COPD Mother    Breast cancer Mother 87   Heart disease Father    Hemochromatosis Brother     Social History   Tobacco Use   Smoking status: Every Day    Packs/day: 0.10    Types: Cigarettes   Smokeless tobacco: Never  Vaping Use   Vaping Use: Never used  Substance Use Topics   Alcohol use: Yes    Comment: occ   Drug use: Yes    Types: Marijuana    Prior to Admission medications   Medication Sig Start Date End Date Taking? Authorizing Provider  meloxicam (MOBIC) 15 MG tablet Take 1 tablet daily as needed for hip pain. 12/17/21  Yes Shogo Larkey, MD  albuterol (PROVENTIL) (2.5 MG/3ML) 0.083% nebulizer solution INHALE 3 MLS (2.5 MG TOTAL) BY  NEBULIZATION EVERY 6 (SIX) HOURS AS NEEDED FOR WHEEZING OR SHORTNESS OF BREATH. 12/10/21 12/10/22  Fenton Foy, NP  albuterol (VENTOLIN HFA) 108 (90 Base) MCG/ACT inhaler INHALE 2 PUFFS INTO THE LUNGS EVERY 4 (FOUR) HOURS AS NEEDED FOR WHEEZING OR SHORTNESS OF BREATH (COUGH, SHORTNESS OF BREATH OR WHEEZING.). 10/29/21   Fenton Foy, NP  amitriptyline (ELAVIL) 50 MG tablet TAKE 1 TABLET (50 MG TOTAL) BY MOUTH AT BEDTIME. 11/10/21 11/10/22  Fenton Foy, NP  diclofenac Sodium (VOLTAREN) 1 % GEL 1 application. 2 (two) times daily as needed. 08/03/21   Dion Saucier A, PA  metoprolol tartrate (LOPRESSOR) 25 MG tablet TAKE 2 TABLETS BY MOUTH ONCE DAILY IN THE MORNING AND 1 TABLET ONCE DAILY IN THE EVENING. 11/10/21 11/10/22  Fenton Foy, NP  montelukast (SINGULAIR) 10 MG tablet TAKE 1 TABLET (10 MG TOTAL) BY MOUTH AT BEDTIME. 12/10/21 12/10/22  Fenton Foy, NP  propylthiouracil (PTU) 50 MG tablet TAKE 4 TABLETS BY MOUTH 3 TIMES DAILY. 11/10/21 11/10/22  Fenton Foy, NP    Allergies Codeine   REVIEW OF SYSTEMS  Negative except as noted here or in the History of Present Illness.   PHYSICAL EXAMINATION  Initial  Vital Signs Blood pressure 130/77, pulse 70, temperature 98.3 F (36.8 C), temperature source Oral, resp. rate 18, SpO2 99 %.  Examination General: Well-developed, well-nourished female in no acute distress; appearance consistent with age of record HENT: normocephalic; atraumatic Eyes: Normal appearance Neck: supple Heart: regular rate and rhythm Lungs: clear to auscultation bilaterally Abdomen: soft; nondistended; nontender; bowel sounds present Extremities: No deformity; full range of motion; tenderness of left groin fold and lesser tenderness of left SI joint Neurologic: Awake, alert and oriented; motor function intact in all extremities and symmetric; no facial droop Skin: Warm and dry Psychiatric: Normal mood and affect   RESULTS  Summary of this visit's results,  reviewed and interpreted by myself:   EKG Interpretation  Date/Time:    Ventricular Rate:    PR Interval:    QRS Duration:   QT Interval:    QTC Calculation:   R Axis:     Text Interpretation:         Laboratory Studies: No results found for this or any previous visit (from the past 24 hour(s)). Imaging Studies: DG Hip Unilat With Pelvis 2-3 Views Left  Result Date: 12/16/2021 CLINICAL DATA:  Left leg pain after slipping on a floor EXAM: DG HIP (WITH OR WITHOUT PELVIS) 2-3V LEFT COMPARISON:  None Available. FINDINGS: There is no evidence of hip fracture or dislocation. There is no evidence of arthropathy or other focal bone abnormality. IMPRESSION: Negative. Electronically Signed   By: Merilyn Baba M.D.   On: 12/16/2021 19:21    ED COURSE and MDM  Nursing notes, initial and subsequent vitals signs, including pulse oximetry, reviewed and interpreted by myself.  Vitals:   12/16/21 1832 12/17/21 0009  BP: 130/77 104/60  Pulse: 70 68  Resp: 18 18  Temp: 98.3 F (36.8 C) 98 F (36.7 C)  TempSrc: Oral Oral  SpO2: 99% 98%   Medications  naproxen (NAPROSYN) tablet 500 mg (500 mg Oral Patient Refused/Not Given 12/17/21 0051)    The patient's principal tenderness is in the left groin fold.  I suspect she has some tendinitis of the left anterior thigh musculature.  We will try her on Mobic as this may be better tolerated than the ibuprofen.  She does not take narcotic medication.  We will refer to orthopedics for further evaluation.  PROCEDURES  Procedures   ED DIAGNOSES     ICD-10-CM   1. Hip strain, left, initial encounter  P82.423N          Shanon Rosser, MD 12/17/21 [phone]

## 2021-12-17 NOTE — ED Notes (Signed)
Pt provided with frozen dinner tray and cranberry juice at this time.

## 2021-12-18 ENCOUNTER — Telehealth: Payer: Self-pay | Admitting: Emergency Medicine

## 2021-12-18 NOTE — Telephone Encounter (Signed)
Encounter opened in error

## 2021-12-20 ENCOUNTER — Other Ambulatory Visit: Payer: Self-pay

## 2021-12-20 ENCOUNTER — Other Ambulatory Visit (HOSPITAL_COMMUNITY): Payer: Self-pay | Admitting: Emergency Medicine

## 2021-12-22 ENCOUNTER — Other Ambulatory Visit (HOSPITAL_COMMUNITY): Payer: Self-pay

## 2021-12-22 ENCOUNTER — Other Ambulatory Visit: Payer: Self-pay

## 2021-12-22 MED ORDER — ALBUTEROL SULFATE (2.5 MG/3ML) 0.083% IN NEBU
3.0000 mL | INHALATION_SOLUTION | Freq: Four times a day (QID) | RESPIRATORY_TRACT | 11 refills | Status: DC | PRN
  Filled 2021-12-22 – 2021-12-27 (×3): qty 90, 8d supply, fill #0

## 2021-12-22 MED ORDER — MONTELUKAST SODIUM 10 MG PO TABS
10.0000 mg | ORAL_TABLET | Freq: Every day | ORAL | 3 refills | Status: DC
  Filled 2021-12-22: qty 90, fill #0
  Filled 2021-12-23 – 2022-02-24 (×9): qty 90, 90d supply, fill #0

## 2021-12-23 ENCOUNTER — Other Ambulatory Visit (HOSPITAL_COMMUNITY): Payer: Self-pay

## 2021-12-25 ENCOUNTER — Other Ambulatory Visit (HOSPITAL_COMMUNITY): Payer: Self-pay

## 2021-12-27 ENCOUNTER — Other Ambulatory Visit (HOSPITAL_COMMUNITY): Payer: Self-pay

## 2021-12-30 ENCOUNTER — Other Ambulatory Visit (HOSPITAL_COMMUNITY): Payer: Self-pay

## 2021-12-31 ENCOUNTER — Other Ambulatory Visit: Payer: Self-pay

## 2021-12-31 ENCOUNTER — Telehealth: Payer: Self-pay

## 2021-12-31 MED ORDER — MELOXICAM 15 MG PO TABS
15.0000 mg | ORAL_TABLET | Freq: Every day | ORAL | 0 refills | Status: DC | PRN
  Filled 2021-12-31: qty 20, 20d supply, fill #0

## 2021-12-31 NOTE — Telephone Encounter (Signed)
Expre

## 2021-12-31 NOTE — Telephone Encounter (Signed)
Yes - please send these for the patient. Thanks.

## 2021-12-31 NOTE — Telephone Encounter (Signed)
Pt is now getting her scripts through the mail order. She [facility] . The medications are meloxicam, albuterol inhaler and montelukast. All are requesting 90 days . Please advise [facility]

## 2022-01-01 ENCOUNTER — Other Ambulatory Visit (HOSPITAL_COMMUNITY): Payer: Self-pay

## 2022-01-08 ENCOUNTER — Other Ambulatory Visit (HOSPITAL_COMMUNITY): Payer: Self-pay

## 2022-01-10 ENCOUNTER — Other Ambulatory Visit (HOSPITAL_COMMUNITY): Payer: Self-pay

## 2022-01-10 ENCOUNTER — Other Ambulatory Visit: Payer: Self-pay

## 2022-01-10 ENCOUNTER — Ambulatory Visit (INDEPENDENT_AMBULATORY_CARE_PROVIDER_SITE_OTHER): Payer: Commercial Managed Care - HMO | Admitting: Internal Medicine

## 2022-01-10 ENCOUNTER — Encounter: Payer: Self-pay | Admitting: Internal Medicine

## 2022-01-10 VITALS — BP 122/80 | HR 64 | Ht 66.0 in | Wt 133.0 lb

## 2022-01-10 DIAGNOSIS — E0590 Thyrotoxicosis, unspecified without thyrotoxic crisis or storm: Secondary | ICD-10-CM | POA: Diagnosis not present

## 2022-01-10 LAB — TSH: TSH: 0.03 u[IU]/mL — ABNORMAL LOW (ref 0.35–5.50)

## 2022-01-10 LAB — T4, FREE: Free T4: 0.77 ng/dL (ref 0.60–1.60)

## 2022-01-10 NOTE — Progress Notes (Unsigned)
Name: Jennifer Stanton  MRN/ DOB: [id number], 1960-05-09    Age/ Sex: 61 y.o., female    PCP: Fenton Foy, NP   Reason for Endocrinology Evaluation: Hyperthyroidism     Date of Initial Endocrinology Evaluation: 01/10/2022     HPI: [name] is a 61 y.o. female with a past medical history of Hyperthyroidism. The patient presented for initial endocrinology clinic visit on 01/10/2022 for consultative assistance with her Hyperthyroidism.   Pt has been diagnosed with hyperthyroidism since 2018. Has been on PTU since 2019.  No prior use of methimazole    Thyroid ultrasound did  NOT show any thyroid nodules in 2019 and 2020.   Weight has been stable from July, 2023 but has noted weight loss prior to that   Has palpitations  Has occasional tremors Has itching of the eyes  Has occasional diarrhea   No FH of thyroid disease   No Biotin  NO prior radiation   She is on PTU 50 mg , 4 tabs TID   HISTORY:  Past Medical History:  Past Medical History:  Diagnosis Date  . Blurry vision 06/20/2014  . Cephalalgia 06/20/2014  . COPD (chronic obstructive pulmonary disease) ([place])   . Grieving 04/2019  . Heart palpitations   . History of seizure 06/20/2014  . Hyperthyroidism   . Marijuana use 04/2019  . Otitis media   . Right ear pain   . Seizures ([place])   . Thyroid disease    Past Surgical History:  Past Surgical History:  Procedure Laterality Date  . INNER EAR SURGERY  at age 15  . surgey on thumb  02/29/2020  . TUBAL LIGATION  Jul 05, 1991    Social History:  reports that she has been smoking cigarettes. She has been smoking an average of .1 packs per day. She has never used smokeless tobacco. She reports current alcohol use. She reports current drug use. Drug: Marijuana. Family History: family history includes Breast cancer (age of onset: 61) in her mother; COPD in her mother; Cancer in her mother; Heart disease in her father; Hemochromatosis in her  brother.   HOME MEDICATIONS: Allergies as of 01/10/2022       Reactions   Codeine Rash        Medication List        Accurate as of January 10, 2022 12:09 PM. If you have any questions, ask your nurse or doctor.          STOP taking these medications    diclofenac Sodium 1 % Gel Commonly known as: VOLTAREN Stopped by: Dorita Sciara, MD       TAKE these medications    albuterol 108 (90 Base) MCG/ACT inhaler Commonly known as: Ventolin HFA INHALE 2 PUFFS INTO THE LUNGS EVERY 4 (FOUR) HOURS AS NEEDED FOR WHEEZING OR SHORTNESS OF BREATH (COUGH, SHORTNESS OF BREATH OR WHEEZING.). What changed: Another medication with the same name was removed. Continue taking this medication, and follow the directions you see here. Changed by: Dorita Sciara, MD   amitriptyline 50 MG tablet Commonly known as: ELAVIL TAKE 1 TABLET (50 MG TOTAL) BY MOUTH AT BEDTIME.   meloxicam 15 MG tablet Commonly known as: Mobic Take 1 tablet daily as needed for hip pain.   metoprolol tartrate 25 MG tablet Commonly known as: LOPRESSOR TAKE 2 TABLETS BY MOUTH ONCE DAILY IN THE MORNING AND 1 TABLET ONCE DAILY IN THE EVENING.   montelukast 10 MG tablet  Commonly known as: SINGULAIR Take 1 tablet (10 mg total) by mouth at bedtime.   propylthiouracil 50 MG tablet Commonly known as: PTU TAKE 4 TABLETS BY MOUTH 3 TIMES DAILY.          REVIEW OF SYSTEMS: A comprehensive ROS was conducted with the patient and is negative except as per HPI     OBJECTIVE:  VS: BP 122/80 (BP Location: Left Arm, Patient Position: Sitting, Cuff Size: Normal)   Pulse 64   Ht _0  (1.676 m)   Wt 133 lb (60.3 kg)   SpO2 97%   BMI 21.47 kg/m    Wt Readings from Last 3 Encounters:  01/10/22 133 lb (60.3 kg)  09/23/21 131 lb 4 oz (59.5 kg)  08/03/21 145 lb (65.8 kg)     EXAM: General: Pt appears well and is in NAD  Eyes: External eye exam normal without stare, lid lag or exophthalmos.  EOM  intact.  PERRL.  Neck: General: Supple without adenopathy. Thyroid: Thyroid size normal.  No goiter or nodules appreciated. No thyroid bruit.  Lungs: Clear with good BS bilat with no rales, rhonchi, or wheezes  Heart: Auscultation: RRR.  Abdomen: Normoactive bowel sounds, soft, nontender, without masses or organomegaly palpable  Extremities:  BL LE: No pretibial edema normal ROM and strength.  Mental Status: Judgment, insight: Intact Orientation: Oriented to time, place, and person Mood and affect: No depression, anxiety, or agitation     DATA REVIEWED:  Latest Reference Range & Units 01/10/22 11:58  TSH 0.35 - 5.50 uIU/mL 0.03 Repeated and verified X2. (L)  Triiodothyronine (T3) 76 - 181 ng/dL 98  T4,Free(Direct) 0.60 - 1.60 ng/dL 0.77      Latest Reference Range & Units 09/23/21 11:07  Sodium 134 - 144 mmol/L 136  Potassium 3.5 - 5.2 mmol/L 4.6  Chloride 96 - 106 mmol/L 101  CO2 20 - 29 mmol/L 21  Glucose 70 - 99 mg/dL 89  BUN 6 - 24 mg/dL 11  Creatinine 0.57 - 1.00 mg/dL 0.59  Calcium 8.7 - 10.2 mg/dL 10.1  BUN/Creatinine Ratio 9 - 23  19  eGFR >59 mL/min/1.73 104  Alkaline Phosphatase 44 - 121 IU/L 112  Albumin 3.8 - 4.9 g/dL 4.1  Albumin/Globulin Ratio 1.2 - 2.2  1.5  AST 0 - 40 IU/L 20  ALT 0 - 32 IU/L 13  Total Protein 6.0 - 8.5 g/dL 6.8  Total Bilirubin 0.0 - 1.2 mg/dL 0.4    ASSESSMENT/PLAN/RECOMMENDATIONS:   Hyperthyroidism:  - Pt is clinically hyperthyroid - Most likely Graves' disease but toxic nodule is another differential although prior thyroid ultrasounds did not show and nodules  - We discussed that Graves' Disease is a result of an autoimmune condition involving the thyroid.    We discussed with pt the benefits of PTU in the Tx of hyperthyroidism, as well as the possible side effects/complications of anti-thyroid drug Tx (specifically detailing the rare, but serious side effect of agranulocytosis). She was informed of need for regular thyroid  function monitoring while on methimazole to ensure appropriate dosage without over-treatment. As well, we discussed the possible side effects of methimazole including the chance of rash, the small chance of liver irritation/juandice and the <=1 in 300-400 chance of sudden onset agranulocytosis.     - We extensively discussed the various treatment options for hyperthyroidism and Graves disease including ablation therapy with radioactive iodine versus antithyroid drug treatment versus surgical therapy.  Pt opted for surgical intervention    - I  carefully explained to the patient that one of the consequences of I-131 ablation treatment would likely be permanent hypothyroidism which would require long-term replacement therapy with LT4. -Patient is requiring higher doses of PTU, patient assures me compliance -Repeat labs today show normalization of T4 and T3, TSH continues to be abnormal but this lags behind   Medications : PTU 50 mg, 4 tabs TID   F/U in 4 months Labs in 2 months     Signed electronically by: Mack Guise, MD  [facility] Endocrinology  [facility] [address] [place], [place] [address] Phone: [phone] FAX: [phone]   CC: Fenton Foy, NP [place] [address], [place] [address] Phone: [phone] Fax: [phone]   Return to Endocrinology clinic as below: Future Appointments  Date Time Provider [place]  01/10/2022 12:10 PM Nollie Shiflett, Melanie Crazier, MD [facility] None  03/24/2022 10:40 AM Fenton Foy, NP [facility] None  05/26/2022  9:10 AM Prisha Hiley, Melanie Crazier, MD [facility] None

## 2022-01-11 ENCOUNTER — Other Ambulatory Visit: Payer: Self-pay

## 2022-01-11 MED ORDER — PROPYLTHIOURACIL 50 MG PO TABS
200.0000 mg | ORAL_TABLET | Freq: Three times a day (TID) | ORAL | 6 refills | Status: DC
  Filled 2022-01-11 – 2022-03-10 (×6): qty 360, 30d supply, fill #0

## 2022-01-12 ENCOUNTER — Other Ambulatory Visit: Payer: Self-pay | Admitting: *Deleted

## 2022-01-12 LAB — THYROID STIMULATING IMMUNOGLOBULIN: TSI: 465 % baseline — ABNORMAL HIGH (ref <140)

## 2022-01-12 LAB — T3: T3, Total: 98 ng/dL (ref 76–181)

## 2022-01-13 ENCOUNTER — Encounter: Payer: Self-pay | Admitting: Internal Medicine

## 2022-01-17 ENCOUNTER — Other Ambulatory Visit (HOSPITAL_COMMUNITY): Payer: Self-pay

## 2022-01-19 ENCOUNTER — Other Ambulatory Visit (HOSPITAL_COMMUNITY): Payer: Self-pay

## 2022-01-24 ENCOUNTER — Other Ambulatory Visit (HOSPITAL_COMMUNITY): Payer: Self-pay

## 2022-01-30 ENCOUNTER — Other Ambulatory Visit (HOSPITAL_COMMUNITY): Payer: Self-pay

## 2022-01-31 ENCOUNTER — Other Ambulatory Visit (HOSPITAL_COMMUNITY): Payer: Self-pay

## 2022-02-05 ENCOUNTER — Other Ambulatory Visit (HOSPITAL_COMMUNITY): Payer: Self-pay

## 2022-02-08 ENCOUNTER — Ambulatory Visit: Payer: Self-pay | Admitting: Surgery

## 2022-02-24 ENCOUNTER — Other Ambulatory Visit: Payer: Self-pay

## 2022-03-08 ENCOUNTER — Other Ambulatory Visit: Payer: Self-pay

## 2022-03-09 ENCOUNTER — Encounter (HOSPITAL_COMMUNITY): Payer: Self-pay

## 2022-03-09 ENCOUNTER — Other Ambulatory Visit: Payer: Self-pay

## 2022-03-09 ENCOUNTER — Other Ambulatory Visit (HOSPITAL_COMMUNITY): Payer: Self-pay

## 2022-03-09 MED ORDER — MONTELUKAST SODIUM 10 MG PO TABS: 10.0000 mg | ORAL_TABLET | Freq: Every day | ORAL | 3 refills | Status: DC

## 2022-03-09 MED ORDER — IBUPROFEN 200 MG PO TABS
600.0000 mg | ORAL_TABLET | Freq: Three times a day (TID) | ORAL | 0 refills | Status: DC | PRN
  Filled 2022-03-09 – 2022-03-10 (×2): qty 30, 3d supply, fill #0

## 2022-03-09 NOTE — Telephone Encounter (Signed)
Caller & Relationship to patient:  MRN #  [id number]   Call Back Number:   Date of Last Office Visit: 03/09/2022     Date of Next Office Visit: 03/24/2022    Medication(s) to be Refilled: Ibuprofen  Preferred Pharmacy: [facility]   ** Please notify patient to allow 48-72 hours to process** **Let patient know to contact pharmacy at the end of the day to make sure medication is ready. ** **If patient has not been seen in a year or longer, book an appointment **Advise to use MyChart for refill requests OR to contact their pharmacy

## 2022-03-09 NOTE — Progress Notes (Addendum)
COVID Vaccine Completed:  Yes  Date of COVID positive in last 90 days:  No  PCP - Lazaro Arms, NP Cardiologist - N/A  Chest x-ray - N/A EKG - 03-11-22 Epic Stress Test - N/A ECHO - N/A Cardiac Cath - N/A Pacemaker/ICD device last checked: Spinal Cord Stimulator:N/A  Bowel Prep - N/A  Sleep Study - N/A CPAP -   Fasting Blood Sugar - N/A Checks Blood Sugar _____ times a day  Last dose of GLP1 agonist-  N/A GLP1 instructions:  N/A   Last dose of SGLT-2 inhibitors-  N/A SGLT-2 instructions: N/A  Blood Thinner Instructions:  N/A Aspirin Instructions: Last Dose:  Activity level:  Can go up a flight of stairs and perform activities of daily living without stopping and without symptoms of chest pain or shortness of breath.  Anesthesia review:  Hx of seizures (last seizure 15 years), COPD, incomplete RBBB, palpitations and tachycardia on Metoprolol.   Patient states that if she does not take her Metoprolol she will have tachycardia in the 130 to 140 range.  Has never seen cardiology.   Dr. Harlow Asa thinks related to hyperthyroidism.  Patient denies shortness of breath, fever, cough and chest pain at PAT appointment  Patient verbalized understanding of instructions that were given to them at the PAT appointment. Patient was also instructed that they will need to review over the PAT instructions again at home before surgery.

## 2022-03-09 NOTE — Patient Instructions (Addendum)
SURGICAL WAITING ROOM VISITATION Patients having surgery or a procedure may have no more than 2 support people in the waiting area - these visitors may rotate.    If the patient needs to stay at the hospital during part of their recovery, the visitor guidelines for inpatient rooms apply. Pre-op nurse will coordinate an appropriate time for 1 support person to accompany patient in pre-op.  This support person may not rotate.    Please refer to the [facility] website for the visitor guidelines for Inpatients (after your surgery is over and you are in a regular room).   Due to an increase in RSV and influenza rates and associated hospitalizations, children ages 32 and under may not visit patients in [place].     Your procedure is scheduled on: 03-18-22   Report to [facility] Main Entrance    Report to admitting at 6:45 AM   Call this number if you have problems the morning of surgery [phone]   Do not eat food :After Midnight.   After Midnight you may have the following liquids until 6:00 AM DAY OF SURGERY  Water Non-Citrus Juices (without pulp, NO RED) Carbonated Beverages Black Coffee (NO MILK/CREAM OR CREAMERS, sugar ok)  Clear Tea (NO MILK/CREAM OR CREAMERS, sugar ok) regular and decaf                             Plain Jell-O (NO RED)                                           Fruit ices (not with fruit pulp, NO RED)                                     Popsicles (NO RED)                                                               Sports drinks like Gatorade (NO RED)                      If you have questions, please contact your surgeon's office.   FOLLOW ANY ADDITIONAL PRE OP INSTRUCTIONS YOU RECEIVED FROM YOUR SURGEON'S OFFICE!!!     Oral Hygiene is also important to reduce your risk of infection.                                    Remember - BRUSH YOUR TEETH THE MORNING OF SURGERY WITH YOUR REGULAR TOOTHPASTE   Do NOT smoke after  Midnight   Take these medicines the morning of surgery with A SIP OF WATER:   Metoprolol  Okay to use Albuterol inhaler                              You may not have any metal on your body including hair pins, jewelry, and body piercing  Do not wear make-up, lotions, powders, perfumes or deodorant  Do not wear nail polish including gel and S&S, artificial/acrylic nails, or any other type of covering on natural nails including finger and toenails. If you have artificial nails, gel coating, etc. that needs to be removed by a nail salon please have this removed prior to surgery or surgery may need to be canceled/ delayed if the surgeon/ anesthesia feels like they are unable to be safely monitored.   Do not shave  48 hours prior to surgery.    Do not bring valuables to the hospital. [place].   Contacts, dentures or bridgework may not be worn into surgery.   Bring small overnight bag day of surgery.   DO NOT [place]. PHARMACY WILL DISPENSE MEDICATIONS LISTED ON YOUR MEDICATION LIST TO YOU DURING YOUR ADMISSION [place]!   Please read over the following fact sheets you were given: IF [place]  If you received a COVID test during your pre-op visit  it is requested that you wear a mask when out in public, stay away from anyone that may not be feeling well and notify your surgeon if you develop symptoms. If you test positive for Covid or have been in contact with anyone that has tested positive in the last 10 days please notify you surgeon.  [facility] - Preparing for Surgery Before surgery, you can play an important role.  Because skin is not sterile, your skin needs to be as free of germs as possible.  You can reduce the number of germs on your skin by washing with CHG (chlorahexidine gluconate) soap before surgery.  CHG is an  antiseptic cleaner which kills germs and bonds with the skin to continue killing germs even after washing. Please DO NOT use if you have an allergy to CHG or antibacterial soaps.  If your skin becomes reddened/irritated stop using the CHG and inform your nurse when you arrive at Short Stay. Do not shave (including legs and underarms) for at least 48 hours prior to the first CHG shower.  You may shave your face/neck.  Please follow these instructions carefully:  1.  Shower with CHG Soap the night before surgery and the  morning of surgery.  2.  If you choose to wash your hair, wash your hair first as usual with your normal  shampoo.  3.  After you shampoo, rinse your hair and body thoroughly to remove the shampoo.                             4.  Use CHG as you would any other liquid soap.  You can apply chg directly to the skin and wash.  Gently with a scrungie or clean washcloth.  5.  Apply the CHG Soap to your body ONLY FROM THE NECK DOWN.   Do   not use on face/ open                           Wound or open sores. Avoid contact with eyes, ears mouth and   genitals (private parts).                       Wash face,  Genitals (private parts) with your normal soap.  6.  Wash thoroughly, paying special attention to the area where your    surgery  will be performed.  7.  Thoroughly rinse your body with warm water from the neck down.  8.  DO NOT shower/wash with your normal soap after using and rinsing off the CHG Soap.                9.  Pat yourself dry with a clean towel.            10.  Wear clean pajamas.            11.  Place clean sheets on your bed the night of your first shower and do not  sleep with pets. Day of Surgery : Do not apply any lotions/deodorants the morning of surgery.  Please wear clean clothes to the hospital/surgery center.  FAILURE TO FOLLOW THESE INSTRUCTIONS MAY RESULT IN THE CANCELLATION OF YOUR SURGERY  PATIENT  SIGNATURE_________________________________  NURSE SIGNATURE__________________________________  ________________________________________________________________________

## 2022-03-10 ENCOUNTER — Encounter (HOSPITAL_COMMUNITY): Payer: Self-pay

## 2022-03-10 ENCOUNTER — Other Ambulatory Visit (HOSPITAL_COMMUNITY): Payer: Self-pay

## 2022-03-11 ENCOUNTER — Encounter (HOSPITAL_COMMUNITY)
Admission: RE | Admit: 2022-03-11 | Discharge: 2022-03-11 | Disposition: A | Discharge status: Home / Self Care | Payer: Commercial Managed Care - HMO | Source: Ambulatory Visit | Attending: Surgery | Admitting: Surgery

## 2022-03-11 ENCOUNTER — Encounter (HOSPITAL_COMMUNITY): Payer: Self-pay

## 2022-03-11 ENCOUNTER — Encounter: Payer: Self-pay | Admitting: Surgery

## 2022-03-11 ENCOUNTER — Other Ambulatory Visit: Payer: Self-pay

## 2022-03-11 VITALS — BP 134/78 | HR 59 | Temp 98.9°F | Resp 20 | Ht 66.0 in | Wt 138.2 lb

## 2022-03-11 DIAGNOSIS — I4510 Unspecified right bundle-branch block: Secondary | ICD-10-CM | POA: Insufficient documentation

## 2022-03-11 DIAGNOSIS — Z01818 Encounter for other preprocedural examination: Secondary | ICD-10-CM | POA: Insufficient documentation

## 2022-03-11 DIAGNOSIS — I2510 Atherosclerotic heart disease of native coronary artery without angina pectoris: Secondary | ICD-10-CM

## 2022-03-11 HISTORY — DX: Nausea with vomiting, unspecified: R11.2

## 2022-03-11 HISTORY — DX: Other specified postprocedural states: Z98.890

## 2022-03-11 HISTORY — DX: Anxiety disorder, unspecified: F41.9

## 2022-03-11 HISTORY — DX: Unspecified abnormal cytological findings in specimens from cervix uteri: R87.619

## 2022-03-11 HISTORY — DX: Tachycardia, unspecified: R00.0

## 2022-03-11 HISTORY — DX: Anemia, unspecified: D64.9

## 2022-03-11 LAB — BASIC METABOLIC PANEL
Anion gap: 7 (ref 5–15)
BUN: 14 mg/dL (ref 6–20)
CO2: 25 mmol/L (ref 22–32)
Calcium: 8.6 mg/dL — ABNORMAL LOW (ref 8.9–10.3)
Chloride: 105 mmol/L (ref 98–111)
Creatinine, Ser: 0.68 mg/dL (ref 0.44–1.00)
GFR, Estimated: >60 mL/min (ref >60)
Glucose, Bld: 104 mg/dL — ABNORMAL HIGH (ref 70–99)
Potassium: 3.6 mmol/L (ref 3.5–5.1)
Sodium: 137 mmol/L (ref 135–145)

## 2022-03-11 LAB — CBC
HCT: 42.7 % (ref 36.0–46.0)
Hemoglobin: 14.6 g/dL (ref 12.0–15.0)
MCH: 30.2 pg (ref 26.0–34.0)
MCHC: 34.2 g/dL (ref 30.0–36.0)
MCV: 88.4 fL (ref 80.0–100.0)
Platelets: 255 10*3/uL (ref 150–400)
RBC: 4.83 MIL/uL (ref 3.87–5.11)
RDW: 13.2 % (ref 11.5–15.5)
WBC: 5.4 10*3/uL (ref 4.0–10.5)
nRBC: 0 % (ref 0.0–0.2)

## 2022-03-11 NOTE — H&P (Signed)
REFERRING PHYSICIAN: Self  PROVIDER: Cherrish Vitali Charlotta Newton, MD   Chief Complaint: New Consultation ( Hyperthyroidism)  History of Present Illness:  Patient is referred by Dr. Vivia Ewing for surgical evaluation and management of hyperthyroidism. Patient has a history of hyperthyroidism dating back to 2018. She has been on antithyroid medication and is currently taking propylthiouracil. She is also on metoprolol. Patient has had complications including weight loss, tremors, palpitations, and eye bulging. She has had no significant visual changes. Patient believes she has been treated with oral radioactive iodine ablation in 2019, however I have not been able to find those records. Patient has been followed by her endocrinologist and is now referred for consideration for thyroidectomy for definitive management of hyperthyroidism. She is accompanied by her sister. There is no family history of thyroid disease. Patient has had no prior history of head or neck surgery.  Review of Systems: A complete review of systems was obtained from the patient. I have reviewed this information and discussed as appropriate with the patient. See HPI as well for other ROS.  Review of Systems  Constitutional: Positive for weight loss.  HENT: Positive for sore throat.  Eyes: Negative.  Respiratory: Negative.  Cardiovascular: Positive for palpitations.  Gastrointestinal: Negative.  Genitourinary: Negative.  Musculoskeletal: Negative.  Skin: Negative.  Neurological: Positive for tingling and tremors.  Endo/Heme/Allergies: Negative.  Psychiatric/Behavioral: The patient has insomnia.    Medical History: History reviewed. No pertinent past medical history.  Patient Active Problem List  Diagnosis  Hyperthyroidism   History reviewed. No pertinent surgical history.   No Known Allergies  Current Outpatient Medications on File Prior to Visit  Medication Sig Dispense Refill  amitriptyline (ELAVIL)  50 MG tablet Take 50 mg by mouth at bedtime  metoprolol tartrate (LOPRESSOR) 25 MG tablet Take by mouth  montelukast (SINGULAIR) 10 mg tablet Take 10 mg by mouth at bedtime  propylthiouraciL (PTU) 50 mg tablet Take by mouth   No current facility-administered medications on file prior to visit.   History reviewed. No pertinent family history.   Social History   Tobacco Use  Smoking Status Every Day  Packs/day: .5  Types: Cigarettes  Smokeless Tobacco Never    Social History   Socioeconomic History  Marital status: Single  Tobacco Use  Smoking status: Every Day  Packs/day: .5  Types: Cigarettes  Smokeless tobacco: Never  Substance and Sexual Activity  Alcohol use: Not Currently  Drug use: Yes  Types: Marijuana   Objective:   Vitals:  BP: (!) 140/90  Pulse: 75  Temp: 36.1 C (97 F)  SpO2: 98%  Weight: 61.7 kg (136 lb)  Height: 167.6 cm (5\' 6" )   Body mass index is 21.95 kg/m.  Physical Exam   GENERAL APPEARANCE Comfortable, no acute issues Development: normal Gross deformities: none  SKIN Rash, lesions, ulcers: none Induration, erythema: none Nodules: none palpable  EYES Conjunctiva and lids: normal Pupils: equal and reactive  EARS, NOSE, MOUTH, THROAT External ears: no lesion or deformity External nose: no lesion or deformity Hearing: grossly normal  NECK Symmetric: yes Trachea: midline Thyroid: no palpable nodules in the thyroid bed  CHEST Respiratory effort: normal Retraction or accessory muscle use: no Breath sounds: normal bilaterally Rales, rhonchi, wheeze: none  CARDIOVASCULAR Auscultation: regular rhythm, normal rate Murmurs: none Pulses: radial pulse 2+ palpable Lower extremity edema: none  ABDOMEN Not assessed  GENITOURINARY/RECTAL Not assessed  MUSCULOSKELETAL Station and gait: normal Digits and nails: no clubbing or cyanosis Muscle strength:  grossly normal all extremities Range of motion: grossly normal all  extremities Deformity: none  LYMPHATIC Cervical: none palpable Supraclavicular: none palpable  PSYCHIATRIC Oriented to person, place, and time: yes Mood and affect: normal for situation Judgment and insight: appropriate for situation   Assessment and Plan:   Hyperthyroidism  Patient is referred by her endocrinologist for consideration for total thyroidectomy for management of hyperthyroidism.  Patient provided with a copy of "The Thyroid Book: Medical and Surgical Treatment of Thyroid Problems", published by [employer], 16 pages. Book reviewed and explained to patient during visit today.  Today we reviewed her clinical history. We reviewed her previous imaging studies and her most recent laboratory studies. We discussed options for management of hyperthyroidism including treatment with antithyroid medications, radioactive iodine ablation, and total thyroidectomy. We discussed the pros and cons of each treatment. We discussed total thyroidectomy in detail. We discussed the size and location of the surgical incision. We discussed the risk of surgery including the risk of recurrent laryngeal nerve injury and injury to parathyroid glands. We discussed the hospital stay to be anticipated and the postoperative recovery. We discussed the need for lifelong thyroid hormone replacement therapy. The patient understands and wishes to proceed with thyroid surgery in the near future.   Armandina Gemma, MD [facility] [place] practice Office: [phone]

## 2022-03-17 NOTE — Anesthesia Preprocedure Evaluation (Addendum)
Anesthesia Evaluation  Patient identified by MRN, date of birth, ID band Patient awake    Reviewed: Allergy & Precautions, NPO status , Patient's Chart, lab work & pertinent test results, reviewed documented beta blocker date and time   History of Anesthesia Complications (+) PONV and history of anesthetic complications  Airway Mallampati: I  TM Distance: >3 FB Neck ROM: Full    Dental  (+) Dental Advisory Given, Edentulous Lower, Edentulous Upper   Pulmonary COPD, Current Smoker and Patient abstained from smoking. 1/2ppd Uses rescue inhaler every other day    Pulmonary exam normal breath sounds clear to auscultation       Cardiovascular negative cardio ROS Normal cardiovascular exam Rhythm:Regular Rate:Normal     Neuro/Psych  Headaches, Seizures - (remote hx, no meds), Well Controlled,  PSYCHIATRIC DISORDERS Anxiety        GI/Hepatic negative GI ROS,,,(+)       marijuana use  Endo/Other   Hyperthyroidism   Renal/GU negative Renal ROS  negative genitourinary   Musculoskeletal negative musculoskeletal ROS (+)    Abdominal   Peds  Hematology negative hematology ROS (+) Hb 14.6   Anesthesia Other Findings   Reproductive/Obstetrics negative OB ROS                             Anesthesia Physical Anesthesia Plan  ASA: 2  Anesthesia Plan: General   Post-op Pain Management: Tylenol PO (pre-op)*   Induction: Intravenous  PONV Risk Score and Plan: 4 or greater and Ondansetron, Dexamethasone, Midazolam, Treatment may vary due to age or medical condition and Scopolamine patch - Pre-op  Airway Management Planned: Oral ETT  Additional Equipment: None  Intra-op Plan:   Post-operative Plan: Extubation in OR  Informed Consent: I have reviewed the patients History and Physical, chart, labs and discussed the procedure including the risks, benefits and alternatives for the proposed  anesthesia with the patient or authorized representative who has indicated his/her understanding and acceptance.     Dental advisory given  Plan Discussed with: CRNA  Anesthesia Plan Comments:        Anesthesia Quick Evaluation

## 2022-03-18 ENCOUNTER — Inpatient Hospital Stay (HOSPITAL_COMMUNITY)
Admission: AD | Admit: 2022-03-18 | Discharge: 2022-03-22 | DRG: 627 | Disposition: A | Discharge status: Home / Self Care | Payer: Commercial Managed Care - HMO | Source: Ambulatory Visit | Attending: Surgery | Admitting: Surgery

## 2022-03-18 ENCOUNTER — Other Ambulatory Visit: Payer: Self-pay

## 2022-03-18 ENCOUNTER — Other Ambulatory Visit (HOSPITAL_COMMUNITY): Payer: Self-pay

## 2022-03-18 ENCOUNTER — Encounter (HOSPITAL_COMMUNITY)
Admission: AD | Disposition: A | Discharge status: Home / Self Care | Payer: Self-pay | Source: Ambulatory Visit | Attending: Surgery

## 2022-03-18 ENCOUNTER — Encounter (HOSPITAL_COMMUNITY): Payer: Self-pay | Admitting: Surgery

## 2022-03-18 ENCOUNTER — Ambulatory Visit (HOSPITAL_COMMUNITY): Payer: Commercial Managed Care - HMO | Admitting: Physician Assistant

## 2022-03-18 ENCOUNTER — Ambulatory Visit (HOSPITAL_COMMUNITY): Payer: Commercial Managed Care - HMO | Admitting: Certified Registered Nurse Anesthetist

## 2022-03-18 DIAGNOSIS — Z79899 Other long term (current) drug therapy: Secondary | ICD-10-CM

## 2022-03-18 DIAGNOSIS — E0500 Thyrotoxicosis with diffuse goiter without thyrotoxic crisis or storm: Principal | ICD-10-CM | POA: Diagnosis present

## 2022-03-18 DIAGNOSIS — R202 Paresthesia of skin: Secondary | ICD-10-CM | POA: Diagnosis not present

## 2022-03-18 DIAGNOSIS — F419 Anxiety disorder, unspecified: Secondary | ICD-10-CM | POA: Diagnosis present

## 2022-03-18 DIAGNOSIS — E0590 Thyrotoxicosis, unspecified without thyrotoxic crisis or storm: Secondary | ICD-10-CM

## 2022-03-18 DIAGNOSIS — T8189XA Other complications of procedures, not elsewhere classified, initial encounter: Secondary | ICD-10-CM | POA: Diagnosis not present

## 2022-03-18 DIAGNOSIS — F17210 Nicotine dependence, cigarettes, uncomplicated: Secondary | ICD-10-CM | POA: Diagnosis present

## 2022-03-18 DIAGNOSIS — Z885 Allergy status to narcotic agent status: Secondary | ICD-10-CM

## 2022-03-18 HISTORY — PX: THYROIDECTOMY: SHX17

## 2022-03-18 SURGERY — THYROIDECTOMY
Anesthesia: General

## 2022-03-18 MED ORDER — ROCURONIUM BROMIDE 100 MG/10ML IV SOLN
INTRAVENOUS | Status: DC | PRN
  Administered 2022-03-18: 10 mg via INTRAVENOUS
  Administered 2022-03-18: 60 mg via INTRAVENOUS

## 2022-03-18 MED ORDER — OXYCODONE HCL 5 MG PO TABS
5.0000 mg | ORAL_TABLET | ORAL | Status: DC | PRN
  Administered 2022-03-18 – 2022-03-20 (×6): 5 mg via ORAL
  Administered 2022-03-20 – 2022-03-21 (×5): 10 mg via ORAL
  Filled 2022-03-18: qty 1
  Filled 2022-03-18 (×3): qty 2
  Filled 2022-03-18 (×2): qty 1
  Filled 2022-03-18: qty 2
  Filled 2022-03-18 (×2): qty 1
  Filled 2022-03-18: qty 2
  Filled 2022-03-18: qty 1
  Filled 2022-03-18: qty 2

## 2022-03-18 MED ORDER — LIDOCAINE HCL (CARDIAC) PF 100 MG/5ML IV SOSY
PREFILLED_SYRINGE | INTRAVENOUS | Status: DC | PRN
  Administered 2022-03-18: 60 mg via INTRAVENOUS

## 2022-03-18 MED ORDER — EPHEDRINE 5 MG/ML INJ
INTRAVENOUS | Status: AC
  Filled 2022-03-18: qty 5

## 2022-03-18 MED ORDER — MIDAZOLAM HCL 2 MG/2ML IJ SOLN
INTRAMUSCULAR | Status: AC
  Filled 2022-03-18: qty 2

## 2022-03-18 MED ORDER — HYDROMORPHONE HCL 1 MG/ML IJ SOLN
INTRAMUSCULAR | Status: AC
  Administered 2022-03-18: 0.25 mg via INTRAVENOUS
  Filled 2022-03-18: qty 1

## 2022-03-18 MED ORDER — TRAMADOL HCL 50 MG PO TABS
50.0000 mg | ORAL_TABLET | Freq: Four times a day (QID) | ORAL | Status: DC | PRN
  Administered 2022-03-18: 50 mg via ORAL
  Filled 2022-03-18 (×2): qty 1

## 2022-03-18 MED ORDER — OXYCODONE HCL 5 MG/5ML PO SOLN: 5.0000 mg | Freq: Once | ORAL | Status: DC | PRN

## 2022-03-18 MED ORDER — AMISULPRIDE (ANTIEMETIC) 5 MG/2ML IV SOLN
INTRAVENOUS | Status: AC
  Administered 2022-03-18: 10 mg via INTRAVENOUS
  Filled 2022-03-18: qty 4

## 2022-03-18 MED ORDER — SODIUM CHLORIDE 0.45 % IV SOLN: INTRAVENOUS | Status: DC

## 2022-03-18 MED ORDER — PHENYLEPHRINE 80 MCG/ML (10ML) SYRINGE FOR IV PUSH (FOR BLOOD PRESSURE SUPPORT)
PREFILLED_SYRINGE | INTRAVENOUS | Status: DC | PRN
  Administered 2022-03-18 (×2): 120 ug via INTRAVENOUS
  Administered 2022-03-18: 80 ug via INTRAVENOUS

## 2022-03-18 MED ORDER — ALBUTEROL SULFATE (2.5 MG/3ML) 0.083% IN NEBU: 3.0000 mL | INHALATION_SOLUTION | RESPIRATORY_TRACT | Status: DC | PRN

## 2022-03-18 MED ORDER — ROCURONIUM BROMIDE 10 MG/ML (PF) SYRINGE
PREFILLED_SYRINGE | INTRAVENOUS | Status: AC
  Filled 2022-03-18: qty 10

## 2022-03-18 MED ORDER — MONTELUKAST SODIUM 10 MG PO TABS
10.0000 mg | ORAL_TABLET | Freq: Every day | ORAL | Status: DC
  Administered 2022-03-19 – 2022-03-21 (×3): 10 mg via ORAL
  Filled 2022-03-18 (×4): qty 1

## 2022-03-18 MED ORDER — FENTANYL CITRATE (PF) 100 MCG/2ML IJ SOLN
INTRAMUSCULAR | Status: AC
  Filled 2022-03-18: qty 2

## 2022-03-18 MED ORDER — CHLORHEXIDINE GLUCONATE 0.12 % MT SOLN
15.0000 mL | Freq: Once | OROMUCOSAL | Status: AC
  Administered 2022-03-18: 15 mL via OROMUCOSAL

## 2022-03-18 MED ORDER — MIDAZOLAM HCL 5 MG/5ML IJ SOLN
INTRAMUSCULAR | Status: DC | PRN
  Administered 2022-03-18: 2 mg via INTRAVENOUS

## 2022-03-18 MED ORDER — EPHEDRINE SULFATE-NACL 50-0.9 MG/10ML-% IV SOSY
PREFILLED_SYRINGE | INTRAVENOUS | Status: DC | PRN
  Administered 2022-03-18 (×2): 5 mg via INTRAVENOUS

## 2022-03-18 MED ORDER — TRAMADOL HCL 50 MG PO TABS
50.0000 mg | ORAL_TABLET | Freq: Four times a day (QID) | ORAL | 0 refills | Status: DC | PRN
  Filled 2022-03-18 – 2022-03-24 (×2): qty 15, 2d supply, fill #0

## 2022-03-18 MED ORDER — DEXAMETHASONE SODIUM PHOSPHATE 10 MG/ML IJ SOLN
INTRAMUSCULAR | Status: AC
  Filled 2022-03-18: qty 1

## 2022-03-18 MED ORDER — CALCIUM CARBONATE ANTACID 500 MG PO CHEW
2.0000 | CHEWABLE_TABLET | Freq: Three times a day (TID) | ORAL | 1 refills | Status: DC
  Filled 2022-03-18 – 2022-03-25 (×3): qty 90, 15d supply, fill #0

## 2022-03-18 MED ORDER — HEMOSTATIC AGENTS (NO CHARGE) OPTIME
TOPICAL | Status: DC | PRN
  Administered 2022-03-18: 1 via TOPICAL

## 2022-03-18 MED ORDER — AMITRIPTYLINE HCL 50 MG PO TABS
50.0000 mg | ORAL_TABLET | Freq: Every day | ORAL | Status: DC
  Administered 2022-03-18 – 2022-03-21 (×4): 50 mg via ORAL
  Filled 2022-03-18 (×4): qty 1

## 2022-03-18 MED ORDER — MEPERIDINE HCL 50 MG/ML IJ SOLN: 6.2500 mg | INTRAMUSCULAR | Status: DC | PRN

## 2022-03-18 MED ORDER — ONDANSETRON HCL 4 MG/2ML IJ SOLN
4.0000 mg | Freq: Once | INTRAMUSCULAR | Status: AC | PRN
  Administered 2022-03-18: 4 mg via INTRAVENOUS

## 2022-03-18 MED ORDER — PROPOFOL 10 MG/ML IV BOLUS
INTRAVENOUS | Status: AC
  Filled 2022-03-18: qty 20

## 2022-03-18 MED ORDER — PROPOFOL 10 MG/ML IV BOLUS
INTRAVENOUS | Status: DC | PRN
  Administered 2022-03-18: 150 mg via INTRAVENOUS

## 2022-03-18 MED ORDER — PROPOFOL 500 MG/50ML IV EMUL
INTRAVENOUS | Status: DC | PRN
  Administered 2022-03-18: 25 ug/kg/min via INTRAVENOUS

## 2022-03-18 MED ORDER — ACETAMINOPHEN 500 MG PO TABS
1000.0000 mg | ORAL_TABLET | Freq: Once | ORAL | Status: AC
  Administered 2022-03-18: 1000 mg via ORAL
  Filled 2022-03-18: qty 2

## 2022-03-18 MED ORDER — CALCIUM CARBONATE 1250 (500 CA) MG PO TABS
2.0000 | ORAL_TABLET | Freq: Three times a day (TID) | ORAL | Status: DC
  Administered 2022-03-18 – 2022-03-19 (×2): 2500 mg via ORAL
  Filled 2022-03-18 (×2): qty 2

## 2022-03-18 MED ORDER — ACETAMINOPHEN 325 MG PO TABS
650.0000 mg | ORAL_TABLET | Freq: Four times a day (QID) | ORAL | Status: DC | PRN
  Administered 2022-03-18 – 2022-03-21 (×2): 650 mg via ORAL
  Filled 2022-03-18 (×2): qty 2

## 2022-03-18 MED ORDER — PHENYLEPHRINE 80 MCG/ML (10ML) SYRINGE FOR IV PUSH (FOR BLOOD PRESSURE SUPPORT)
PREFILLED_SYRINGE | INTRAVENOUS | Status: AC
  Filled 2022-03-18: qty 20

## 2022-03-18 MED ORDER — ONDANSETRON HCL 4 MG/2ML IJ SOLN
4.0000 mg | Freq: Four times a day (QID) | INTRAMUSCULAR | Status: DC | PRN
  Administered 2022-03-21: 4 mg via INTRAVENOUS
  Filled 2022-03-18: qty 2

## 2022-03-18 MED ORDER — ONDANSETRON HCL 4 MG/2ML IJ SOLN
INTRAMUSCULAR | Status: DC | PRN
  Administered 2022-03-18: 4 mg via INTRAVENOUS

## 2022-03-18 MED ORDER — LACTATED RINGERS IV SOLN: INTRAVENOUS | Status: DC

## 2022-03-18 MED ORDER — ONDANSETRON HCL 4 MG/2ML IJ SOLN
INTRAMUSCULAR | Status: AC
  Filled 2022-03-18: qty 2

## 2022-03-18 MED ORDER — FENTANYL CITRATE (PF) 100 MCG/2ML IJ SOLN
INTRAMUSCULAR | Status: DC | PRN
  Administered 2022-03-18 (×2): 25 ug via INTRAVENOUS
  Administered 2022-03-18: 50 ug via INTRAVENOUS

## 2022-03-18 MED ORDER — CHLORHEXIDINE GLUCONATE CLOTH 2 % EX PADS: 6.0000 | MEDICATED_PAD | Freq: Once | CUTANEOUS | Status: DC

## 2022-03-18 MED ORDER — 0.9 % SODIUM CHLORIDE (POUR BTL) OPTIME
TOPICAL | Status: DC | PRN
  Administered 2022-03-18: 1000 mL

## 2022-03-18 MED ORDER — ONDANSETRON 4 MG PO TBDP: 4.0000 mg | ORAL_TABLET | Freq: Four times a day (QID) | ORAL | Status: DC | PRN

## 2022-03-18 MED ORDER — HYDROMORPHONE HCL 1 MG/ML IJ SOLN
0.2500 mg | INTRAMUSCULAR | Status: DC | PRN
  Administered 2022-03-18: 0.5 mg via INTRAVENOUS
  Administered 2022-03-18: 0.25 mg via INTRAVENOUS

## 2022-03-18 MED ORDER — ACETAMINOPHEN 650 MG RE SUPP: 650.0000 mg | Freq: Four times a day (QID) | RECTAL | Status: DC | PRN

## 2022-03-18 MED ORDER — ORAL CARE MOUTH RINSE: 15.0000 mL | Freq: Once | OROMUCOSAL | Status: AC

## 2022-03-18 MED ORDER — CEFAZOLIN SODIUM-DEXTROSE 2-4 GM/100ML-% IV SOLN
2.0000 g | INTRAVENOUS | Status: AC
  Administered 2022-03-18: 2 g via INTRAVENOUS
  Filled 2022-03-18: qty 100

## 2022-03-18 MED ORDER — SUGAMMADEX SODIUM 200 MG/2ML IV SOLN
INTRAVENOUS | Status: DC | PRN
  Administered 2022-03-18: 200 mg via INTRAVENOUS

## 2022-03-18 MED ORDER — SCOPOLAMINE 1 MG/3DAYS TD PT72
1.0000 | MEDICATED_PATCH | TRANSDERMAL | Status: DC
  Administered 2022-03-18: 1.5 mg via TRANSDERMAL
  Filled 2022-03-18: qty 1

## 2022-03-18 MED ORDER — HYDROMORPHONE HCL 1 MG/ML IJ SOLN: 1.0000 mg | INTRAMUSCULAR | Status: DC | PRN

## 2022-03-18 MED ORDER — LIDOCAINE HCL (PF) 2 % IJ SOLN
INTRAMUSCULAR | Status: AC
  Filled 2022-03-18: qty 5

## 2022-03-18 MED ORDER — METOPROLOL TARTRATE 25 MG PO TABS
25.0000 mg | ORAL_TABLET | Freq: Two times a day (BID) | ORAL | Status: DC
  Administered 2022-03-18 – 2022-03-20 (×4): 25 mg via ORAL
  Filled 2022-03-18 (×4): qty 1

## 2022-03-18 MED ORDER — DEXAMETHASONE SODIUM PHOSPHATE 10 MG/ML IJ SOLN
INTRAMUSCULAR | Status: DC | PRN
  Administered 2022-03-18: 5 mg via INTRAVENOUS

## 2022-03-18 MED ORDER — LEVOTHYROXINE SODIUM 88 MCG PO TABS
88.0000 ug | ORAL_TABLET | Freq: Every day | ORAL | 2 refills | Status: DC
  Filled 2022-03-18 – 2022-03-25 (×5): qty 30, 30d supply, fill #0
  Filled 2022-04-07: qty 30, 30d supply, fill #1

## 2022-03-18 MED ORDER — OXYCODONE HCL 5 MG PO TABS: 5.0000 mg | ORAL_TABLET | Freq: Once | ORAL | Status: DC | PRN

## 2022-03-18 MED ORDER — AMISULPRIDE (ANTIEMETIC) 5 MG/2ML IV SOLN: 10.0000 mg | Freq: Once | INTRAVENOUS | Status: AC | PRN

## 2022-03-18 SURGICAL SUPPLY — 29 items
ATTRACTOMAT 16X20 MAGNETIC DRP (DRAPES) ×1 IMPLANT
BAG COUNTER SPONGE SURGICOUNT (BAG) ×1 IMPLANT
BLADE SURG 15 STRL LF DISP TIS (BLADE) ×1 IMPLANT
BLADE SURG 15 STRL SS (BLADE) ×1
CHLORAPREP W/TINT 26 (MISCELLANEOUS) ×1 IMPLANT
CLIP TI MEDIUM 6 (CLIP) ×2 IMPLANT
CLIP TI WIDE RED SMALL 6 (CLIP) ×2 IMPLANT
COVER SURGICAL LIGHT HANDLE (MISCELLANEOUS) ×1 IMPLANT
DERMABOND ADVANCED .7 DNX12 (GAUZE/BANDAGES/DRESSINGS) ×1 IMPLANT
DRAPE LAPAROTOMY T 98X78 PEDS (DRAPES) ×1 IMPLANT
DRAPE UTILITY XL STRL (DRAPES) ×1 IMPLANT
ELECT PENCIL ROCKER SW 15FT (MISCELLANEOUS) ×1 IMPLANT
ELECT REM PT RETURN 15FT ADLT (MISCELLANEOUS) ×1 IMPLANT
GAUZE 4X4 16PLY ~~LOC~~+RFID DBL (SPONGE) ×1 IMPLANT
GLOVE SURG ORTHO 8.0 STRL STRW (GLOVE) ×1 IMPLANT
GOWN STRL REUS W/ TWL XL LVL3 (GOWN DISPOSABLE) ×2 IMPLANT
GOWN STRL REUS W/TWL XL LVL3 (GOWN DISPOSABLE) ×2
HEMOSTAT SURGICEL 2X4 FIBR (HEMOSTASIS) ×1 IMPLANT
ILLUMINATOR WAVEGUIDE N/F (MISCELLANEOUS) ×1 IMPLANT
KIT BASIN OR (CUSTOM PROCEDURE TRAY) ×1 IMPLANT
KIT TURNOVER KIT A (KITS) IMPLANT
PACK BASIC VI WITH GOWN DISP (CUSTOM PROCEDURE TRAY) ×1 IMPLANT
SHEARS HARMONIC 9CM CVD (BLADE) ×1 IMPLANT
SUT MNCRL AB 4-0 PS2 18 (SUTURE) ×1 IMPLANT
SUT VIC AB 3-0 SH 18 (SUTURE) ×2 IMPLANT
SYR BULB IRRIG 60ML STRL (SYRINGE) ×1 IMPLANT
TOWEL OR 17X26 10 PK STRL BLUE (TOWEL DISPOSABLE) ×1 IMPLANT
TOWEL OR NON WOVEN STRL DISP B (DISPOSABLE) ×1 IMPLANT
TUBING CONNECTING 10 (TUBING) ×1 IMPLANT

## 2022-03-18 NOTE — Anesthesia Procedure Notes (Signed)
Procedure Name: Intubation Date/Time: 03/18/2022 9:01 AM  Performed by: West Pugh, CRNAPre-anesthesia Checklist: Patient identified, Emergency Drugs available, Suction available, Patient being monitored and Timeout performed Patient Re-evaluated:Patient Re-evaluated prior to induction Oxygen Delivery Method: Circle system utilized Preoxygenation: Pre-oxygenation with 100% oxygen Induction Type: IV induction Ventilation: Mask ventilation without difficulty Laryngoscope Size: Mac and 3 Grade View: Grade I Tube type: Oral Tube size: 7.0 mm Number of attempts: 1 Airway Equipment and Method: Stylet Placement Confirmation: ETT inserted through vocal cords under direct vision, positive ETCO2, CO2 detector and breath sounds checked- equal and bilateral Secured at: 21 cm Tube secured with: Tape Dental Injury: Teeth and Oropharynx as per pre-operative assessment

## 2022-03-18 NOTE — Discharge Instructions (Signed)
[facility] - Dr. Haivyn Oravec  THYROID & PARATHYROID SURGERY:  POST-OP INSTRUCTIONS  Always review the instruction sheet provided by the hospital nurse at discharge.  A prescription for pain medication may be sent to your pharmacy at the time of discharge.  Take your pain medication as prescribed.  If narcotic pain medicine is not needed, then you may take acetaminophen (Tylenol) or ibuprofen (Advil) as needed for pain or soreness.  Take your normal home medications as prescribed unless otherwise directed.  If you need a refill on your pain medication, please contact the office during regular business hours.  Prescriptions will not be processed by the office after 5:00PM or on weekends.  Start with a light diet upon arrival home, such as soup and crackers or toast.  Be sure to drink plenty of fluids.  Resume your normal diet the day after surgery.  Most patients will experience some swelling and bruising on the chest and neck area.  Ice packs will help for the first 48 hours after arriving home.  Swelling and bruising will take several days to resolve.   It is common to experience some constipation after surgery.  Increasing fluid intake and taking a stool softener (Colace) will usually help to prevent this problem.  A mild laxative (Milk of Magnesia or Miralax) should be taken according to package directions if there has been no bowel movement after 48 hours.  Dermabond glue covers your incision. This seals the wound and you may shower at any time. The Dermabond will remain in place for about a week.  You may gradually remove the glue when it loosens around the edges.  If you need to loosen the Dermabond for removal, apply a layer of Vaseline to the wound for 15 minutes and then remove with a Kleenex. Your sutures are under the skin and will not show - they will dissolve on their own.  You may resume light daily activities beginning the day after discharge (such as self-care,  walking, climbing stairs), gradually increasing activities as tolerated. You may have sexual intercourse when it is comfortable. Refrain from any heavy lifting or straining until approved by your doctor. You may drive when you no longer are taking prescription pain medication, you can comfortably wear a seatbelt, and you can safely maneuver your car and apply the brakes.  You will see your doctor in the office for a follow-up appointment approximately three weeks after your surgery.  Make sure that you call for this appointment within a day or two after you arrive home to insure a convenient appointment time. Please have any requested laboratory tests performed a few days prior to your office visit so that the results will be available at your follow up appointment.  WHEN TO CALL THE [facility] OFFICE: -- Fever greater than 101.5 -- Inability to urinate -- Nausea and/or vomiting - persistent -- Extreme swelling or bruising -- Continued bleeding from incision -- Increased pain, redness, or drainage from the incision -- Difficulty swallowing or breathing -- Muscle cramping or spasms -- Numbness or tingling in hands or around lips  The clinic staff is available to answer your questions during regular business hours.  Please don't hesitate to call and ask to speak to one of the nurses if you have concerns.  [facility] OFFICE: [phone] (24 hours)  Please sign up for MyChart accounts. This will allow you to communicate directly with my nurse or myself without having to call the office. It will also allow you   to view your test results. You will need to enroll in MyChart for my office ([facility]) and for the hospital ([place]).  Travell Desaulniers, MD [facility] practice 

## 2022-03-18 NOTE — Anesthesia Postprocedure Evaluation (Signed)
Anesthesia Post Note  Patient: Jennifer Stanton  Procedure(s) Performed: TOTAL THYROIDECTOMY     Patient location during evaluation: PACU Anesthesia Type: General Level of consciousness: awake and alert, oriented and patient cooperative Pain management: pain level controlled Vital Signs Assessment: post-procedure vital signs reviewed and stable Respiratory status: spontaneous breathing, nonlabored ventilation and respiratory function stable Cardiovascular status: blood pressure returned to baseline and stable Postop Assessment: no apparent nausea or vomiting Anesthetic complications: no   No notable events documented.  Last Vitals:  Vitals:   03/18/22 1215 03/18/22 1238  BP: (!) 150/97 (!) 147/81  Pulse: (!) 57 (!) 49  Resp: 15 17  Temp:  (!) 36.4 C  SpO2: 96% 97%    Last Pain:  Vitals:   03/18/22 1238  TempSrc: Oral  PainSc:                  Pervis Hocking

## 2022-03-18 NOTE — Transfer of Care (Signed)
Immediate Anesthesia Transfer of Care Note  Patient: Jennifer Stanton  Procedure(s) Performed: TOTAL THYROIDECTOMY  Patient Location: PACU  Anesthesia Type:General  Level of Consciousness: drowsy and patient cooperative  Airway & Oxygen Therapy: Patient Spontanous Breathing and Patient connected to face mask oxygen  Post-op Assessment: Report given to RN and Post -op Vital signs reviewed and stable  Post vital signs: Reviewed and stable  Last Vitals:  Vitals Value Taken Time  BP 147/70 03/18/22 1049  Temp    Pulse 60 03/18/22 1052  Resp 16 03/18/22 1052  SpO2 100 % 03/18/22 1052  Vitals shown include unvalidated device data.  Last Pain:  Vitals:   03/18/22 0753  TempSrc:   PainSc: 0-No pain         Complications: No notable events documented.

## 2022-03-18 NOTE — Interval H&P Note (Signed)
History and Physical Interval Note:  03/18/2022 8:18 AM  Jennifer Stanton  has presented today for surgery, with the diagnosis of HYPERTHYROIDISM, GRAVE'S DISEASE.  The various methods of treatment have been discussed with the patient and family. After consideration of risks, benefits and other options for treatment, the patient has consented to    Procedure(s): TOTAL THYROIDECTOMY (N/A) as a surgical intervention.    The patient's history has been reviewed, patient examined, no change in status, stable for surgery.  I have reviewed the patient's chart and labs.  Questions were answered to the patient's satisfaction.    Armandina Gemma, [facility] A [place] practice Office: [place]

## 2022-03-18 NOTE — Op Note (Signed)
Procedure Note  Pre-operative Diagnosis:  hyperthyroidism  Post-operative Diagnosis:  same  Surgeon:  Armandina Gemma, MD  Assistant:  none   Procedure:  Total thyroidectomy  Anesthesia:  General  Estimated Blood Loss:  minimal  Drains: none         Specimen: thyroid to pathology  Indications:  Patient is referred by Dr. Vivia Ewing for surgical evaluation and management of hyperthyroidism. Patient has a history of hyperthyroidism dating back to 2018. She has been on antithyroid medication and is currently taking propylthiouracil. She is also on metoprolol. Patient has had complications including weight loss, tremors, palpitations, and eye bulging. She has had no significant visual changes. Patient believes she has been treated with oral radioactive iodine ablation in 2019, however I have not been able to find those records. Patient has been followed by her endocrinologist and is now referred for consideration for thyroidectomy for definitive management of hyperthyroidism.   Procedure Details: Procedure was done in OR #4 at the [facility]. The patient was brought to the operating room and placed in a supine position on the operating room table. Following administration of general anesthesia, the patient was positioned and then prepped and draped in the usual aseptic fashion. After ascertaining that an adequate level of anesthesia had been achieved, a small Kocher incision was made with #15 blade. Dissection was carried through subcutaneous tissues and platysma.Hemostasis was achieved with the electrocautery. Skin flaps were elevated cephalad and caudad from the thyroid notch to the sternal notch. A Mahorner self-retaining retractor was placed for exposure. Strap muscles were incised in the midline and dissection was begun on the left side.  Strap muscles were reflected laterally.  Left thyroid lobe was small and somewhat nodular.  The left lobe was gently mobilized with blunt  dissection. Superior pole vessels were dissected out and divided individually between small and medium ligaclips with the harmonic scalpel. The thyroid lobe was rolled anteriorly. Branches of the inferior thyroid artery were divided between small ligaclips with the harmonic scalpel. Inferior venous tributaries were divided between ligaclips. Both the superior and inferior parathyroid glands were identified and preserved on their vascular pedicles. The recurrent laryngeal nerve was identified and preserved along its course. The ligament of Gwenlyn Found was released with the electrocautery and the gland was mobilized onto the anterior trachea. Isthmus was mobilized across the midline. There was a small pyramidal lobe present which was resected with the isthmus. Dry pack was placed in the left neck.  The right thyroid lobe was gently mobilized with blunt dissection. Right thyroid lobe was mildly enlarged and slight nodular. Superior pole vessels were dissected out and divided between small and medium ligaclips with the Harmonic scalpel. Superior parathyroid was identified and preserved. Inferior venous tributaries were divided between medium ligaclips with the harmonic scalpel. The right thyroid lobe was rolled anteriorly and the branches of the inferior thyroid artery divided between small ligaclips. The right recurrent laryngeal nerve was identified and preserved along its course. The ligament of Gwenlyn Found was released with the electrocautery. The right thyroid lobe was mobilized onto the anterior trachea and the remainder of the thyroid was dissected off the anterior trachea and the thyroid was completely excised. A suture was used to mark the right lobe. The entire thyroid gland was submitted to pathology for review.  The neck was irrigated with warm saline. Fibrillar was placed throughout the operative field. Strap muscles were approximated in the midline with interrupted 3-0 Vicryl sutures. Platysma was closed with  interrupted  3-0 Vicryl sutures. Skin was closed with a running 4-0 Monocryl subcuticular suture. Wound was washed and Dermabond was applied. The patient was awakened from anesthesia and brought to the recovery room. The patient tolerated the procedure well.   Armandina Gemma, [facility] Office: [phone]

## 2022-03-19 ENCOUNTER — Encounter (HOSPITAL_COMMUNITY): Payer: Self-pay | Admitting: Surgery

## 2022-03-19 DIAGNOSIS — E0590 Thyrotoxicosis, unspecified without thyrotoxic crisis or storm: Secondary | ICD-10-CM | POA: Diagnosis present

## 2022-03-19 DIAGNOSIS — E0500 Thyrotoxicosis with diffuse goiter without thyrotoxic crisis or storm: Secondary | ICD-10-CM | POA: Diagnosis present

## 2022-03-19 DIAGNOSIS — F17210 Nicotine dependence, cigarettes, uncomplicated: Secondary | ICD-10-CM | POA: Diagnosis present

## 2022-03-19 DIAGNOSIS — Z79899 Other long term (current) drug therapy: Secondary | ICD-10-CM | POA: Diagnosis not present

## 2022-03-19 DIAGNOSIS — F419 Anxiety disorder, unspecified: Secondary | ICD-10-CM | POA: Diagnosis present

## 2022-03-19 DIAGNOSIS — Z885 Allergy status to narcotic agent status: Secondary | ICD-10-CM | POA: Diagnosis not present

## 2022-03-19 DIAGNOSIS — T8189XA Other complications of procedures, not elsewhere classified, initial encounter: Secondary | ICD-10-CM | POA: Diagnosis not present

## 2022-03-19 DIAGNOSIS — R202 Paresthesia of skin: Secondary | ICD-10-CM | POA: Diagnosis not present

## 2022-03-19 LAB — BASIC METABOLIC PANEL
Anion gap: 10 (ref 5–15)
BUN: 12 mg/dL (ref 6–20)
CO2: 24 mmol/L (ref 22–32)
Calcium: 6.9 mg/dL — ABNORMAL LOW (ref 8.9–10.3)
Chloride: 102 mmol/L (ref 98–111)
Creatinine, Ser: 0.7 mg/dL (ref 0.44–1.00)
GFR, Estimated: >60 mL/min (ref >60)
Glucose, Bld: 96 mg/dL (ref 70–99)
Potassium: 3.8 mmol/L (ref 3.5–5.1)
Sodium: 136 mmol/L (ref 135–145)

## 2022-03-19 LAB — CALCIUM: Calcium: 8.2 mg/dL — ABNORMAL LOW (ref 8.9–10.3)

## 2022-03-19 MED ORDER — CALCIUM CARBONATE 1250 (500 CA) MG PO TABS
2.0000 | ORAL_TABLET | Freq: Four times a day (QID) | ORAL | Status: DC
  Administered 2022-03-19 – 2022-03-22 (×12): 2500 mg via ORAL
  Filled 2022-03-19 (×12): qty 2

## 2022-03-19 MED ORDER — CALCITRIOL 0.25 MCG PO CAPS
0.2500 ug | ORAL_CAPSULE | Freq: Two times a day (BID) | ORAL | Status: DC
  Administered 2022-03-19 – 2022-03-22 (×7): 0.25 ug via ORAL
  Filled 2022-03-19 (×7): qty 1

## 2022-03-19 MED ORDER — CALCIUM GLUCONATE-NACL 2-0.675 GM/100ML-% IV SOLN
2.0000 g | INTRAVENOUS | Status: AC
  Administered 2022-03-19: 2000 mg via INTRAVENOUS
  Filled 2022-03-19: qty 100

## 2022-03-19 NOTE — Progress Notes (Signed)
1 Day Post-Op   Subjective/Chief Complaint: Some neck pain discomfort Tingling in hands Voice a little hoarse   Objective: Vital signs in last 24 hours: Temp:  [96.8 F (36 C)-98.5 F (36.9 C)] 98.1 F (36.7 C) (01/13 0541) Pulse Rate:  [43-70] 45 (01/13 0541) Resp:  [14-19] 18 (01/13 0541) BP: (109-159)/(52-97) 120/52 (01/13 0541) SpO2:  [95 %-100 %] 97 % (01/13 0541)    Intake/Output from previous day: 01/12 0701 - 01/13 0700 In: 2347.3 [P.O.:780; I.V.:1567.3] Out: 900 [Urine:800; Blood:100] Intake/Output this shift: No intake/output data recorded.  Alert, nontoxic Scant bruising on right neck incision, no hematoma   Lab Results:  No results for input(s): "WBC", "HGB", "HCT", "PLT" in the last 72 hours. BMET Recent Labs    03/19/22 0619  NA 136  K 3.8  CL 102  CO2 24  GLUCOSE 96  BUN 12  CREATININE 0.70  CALCIUM 6.9*   PT/INR No results for input(s): "LABPROT", "INR" in the last 72 hours. ABG No results for input(s): "PHART", "HCO3" in the last 72 hours.  Invalid input(s): "PCO2", "PO2"  Studies/Results: No results found.  Anti-infectives: Anti-infectives (From admission, onward)    Start     Dose/Rate Route Frequency Ordered Stop   03/18/22 0715  ceFAZolin (ANCEF) IVPB 2g/100 mL premix        2 g 200 mL/hr over 30 Minutes Intravenous On call to O.R. 03/18/22 0704 03/18/22 0902       Assessment/Plan: s/p Procedure(s): TOTAL THYROIDECTOMY (N/A) Dr Harlow Asa Hypocalcemia - pt symptomatic;  IV calcium gluconate ordered by Dr Harlow Asa; will increase oscal to 2 tab q6hrs; repeat Ca later today; repeat labs in am; will likely need addl iv calcium   Pt not safe for dc today due to symptomatic hypocalcemia   LOS: 0 days    Greer Pickerel 03/19/2022

## 2022-03-20 LAB — BASIC METABOLIC PANEL
Anion gap: 8 (ref 5–15)
BUN: 20 mg/dL (ref 6–20)
CO2: 25 mmol/L (ref 22–32)
Calcium: 7.1 mg/dL — ABNORMAL LOW (ref 8.9–10.3)
Chloride: 101 mmol/L (ref 98–111)
Creatinine, Ser: 0.71 mg/dL (ref 0.44–1.00)
GFR, Estimated: >60 mL/min (ref >60)
Glucose, Bld: 87 mg/dL (ref 70–99)
Potassium: 3.7 mmol/L (ref 3.5–5.1)
Sodium: 134 mmol/L — ABNORMAL LOW (ref 135–145)

## 2022-03-20 LAB — CALCIUM: Calcium: 8.1 mg/dL — ABNORMAL LOW (ref 8.9–10.3)

## 2022-03-20 MED ORDER — METOPROLOL TARTRATE 25 MG PO TABS
25.0000 mg | ORAL_TABLET | Freq: Every day | ORAL | Status: DC
  Administered 2022-03-20 – 2022-03-21 (×2): 25 mg via ORAL
  Filled 2022-03-20 (×2): qty 1

## 2022-03-20 MED ORDER — METOPROLOL TARTRATE 50 MG PO TABS
50.0000 mg | ORAL_TABLET | Freq: Every day | ORAL | Status: DC
  Administered 2022-03-21 – 2022-03-22 (×2): 50 mg via ORAL
  Filled 2022-03-20 (×2): qty 1

## 2022-03-20 MED ORDER — CALCIUM GLUCONATE-NACL 2-0.675 GM/100ML-% IV SOLN
2.0000 g | INTRAVENOUS | Status: AC
  Administered 2022-03-20: 2000 mg via INTRAVENOUS
  Filled 2022-03-20: qty 100

## 2022-03-20 MED ORDER — METOPROLOL TARTRATE 25 MG PO TABS: 25.0000 mg | ORAL_TABLET | Freq: Once | ORAL | Status: DC

## 2022-03-20 NOTE — Progress Notes (Signed)
2 Days Post-Op   Subjective/Chief Complaint:  neck pain discomfort better; voice better Tingling in hands still    Objective: Vital signs in last 24 hours: Temp:  [97.6 F (36.4 C)-98.7 F (37.1 C)] 97.6 F (36.4 C) (01/14 0533) Pulse Rate:  [51-55] 51 (01/14 0533) Resp:  [18] 18 (01/14 0533) BP: (122-125)/(63-66) 125/66 (01/14 0533) SpO2:  [96 %-98 %] 98 % (01/14 0533) Last BM Date : 03/17/22  Intake/Output from previous day: 01/13 0701 - 01/14 0700 In: 1261.9 [P.O.:1080; I.V.:181.9] Out: 300 [Urine:300] Intake/Output this shift: No intake/output data recorded.  Alert, nontoxic Voice nml Scant bruising on right neck incision, no hematoma   Lab Results:  No results for input(s): "WBC", "HGB", "HCT", "PLT" in the last 72 hours. BMET Recent Labs    03/19/22 0619 03/19/22 1323 03/20/22 0526  NA 136  --  134*  K 3.8  --  3.7  CL 102  --  101  CO2 24  --  25  GLUCOSE 96  --  87  BUN 12  --  20  CREATININE 0.70  --  0.71  CALCIUM 6.9* 8.2* 7.1*    PT/INR No results for input(s): "LABPROT", "INR" in the last 72 hours. ABG No results for input(s): "PHART", "HCO3" in the last 72 hours.  Invalid input(s): "PCO2", "PO2"  Studies/Results: No results found.  Anti-infectives: Anti-infectives (From admission, onward)    Start     Dose/Rate Route Frequency Ordered Stop   03/18/22 0715  ceFAZolin (ANCEF) IVPB 2g/100 mL premix        2 g 200 mL/hr over 30 Minutes Intravenous On call to O.R. 03/18/22 0704 03/18/22 0902       Assessment/Plan: s/p Procedure(s): TOTAL THYROIDECTOMY (N/A) Dr Harlow Asa POD 2 Hypocalcemia - pt symptomatic;  2 gm IV calcium gluconate, cont oscal to 2 tab q6hrs; repeat Ca later today; repeat labs in am;   Pt put back on correct home dose of lopressor - two 25mg  tabs qam/ 1 tab q pm Pt not safe for dc today due to symptomatic hypocalcemia   LOS: 1 day    Greer Pickerel 03/20/2022

## 2022-03-21 LAB — BASIC METABOLIC PANEL
Anion gap: 11 (ref 5–15)
BUN: 21 mg/dL — ABNORMAL HIGH (ref 6–20)
CO2: 27 mmol/L (ref 22–32)
Calcium: 8 mg/dL — ABNORMAL LOW (ref 8.9–10.3)
Chloride: 96 mmol/L — ABNORMAL LOW (ref 98–111)
Creatinine, Ser: 0.78 mg/dL (ref 0.44–1.00)
GFR, Estimated: >60 mL/min (ref >60)
Glucose, Bld: 89 mg/dL (ref 70–99)
Potassium: 4.2 mmol/L (ref 3.5–5.1)
Sodium: 134 mmol/L — ABNORMAL LOW (ref 135–145)

## 2022-03-21 LAB — CALCIUM: Calcium: 9.7 mg/dL (ref 8.9–10.3)

## 2022-03-21 LAB — SURGICAL PATHOLOGY

## 2022-03-21 MED ORDER — CALCIUM GLUCONATE-NACL 2-0.675 GM/100ML-% IV SOLN
2.0000 g | INTRAVENOUS | Status: AC
  Administered 2022-03-21: 2000 mg via INTRAVENOUS
  Filled 2022-03-21: qty 100

## 2022-03-21 NOTE — Progress Notes (Signed)
      Repeat calcium level at [facility] today is normal at 9.7 mg/dl.  Discussed discharge home with patient.  She complains of dizziness, sweating.  Plan to repeat calcium in AM 1/16 and discharge home after breakfast.  Continue oral calcium carbonate and Rocaltrol for one more week on discharge.  Armandina Gemma, MD [facility] [place] [facility] Office: [phone]

## 2022-03-21 NOTE — Progress Notes (Signed)
Patient had c/o some dizziness when awakened. Recent lab results showed calcium level did increase from previous results. Patient felt as if it would be safer to stay another night due to current symptoms. MD informed and agree as well. MD will follow up with patient in the AM.

## 2022-03-21 NOTE — Progress Notes (Signed)
    Assessment & Plan: POD#3 status post total thyroidectomy; post op hypocalcemia  Continue IV calcium gluconate, Rocaltrol orally, calcium carbonate orally  Check calcium level at 2 PM today  Possible discharge home today if symptomatically improved and calcium level higher; otherwise home tomorrow AM        Armandina Gemma, [facility] [place] [facility] Office: [phone]        Chief Complaint: hyperthyroidism  Subjective: Patient in bed, comfortable; some paresthesias, spasms; voice normal  Objective: Vital signs in last 24 hours: Temp:  [97.8 F (36.6 C)-98.8 F (37.1 C)] 98.8 F (37.1 C) (01/15 0450) Pulse Rate:  [47-65] 47 (01/15 0450) Resp:  [16] 16 (01/15 0450) BP: (108-143)/(55-89) 108/55 (01/15 0450) SpO2:  [95 %-97 %] 95 % (01/15 0450) Last BM Date : 03/17/22  Intake/Output from previous day: 01/14 0701 - 01/15 0700 In: 522.2 [P.O.:480; I.V.:42.2] Out: -  Intake/Output this shift: Total I/O In: 240 [P.O.:240] Out: 0   Physical Exam: HEENT - sclerae clear, mucous membranes moist Neck - wound dry and intact; Dermabond in place, mild STS, ecchymosis; voice normal Neuro - no Chvostek's  Lab Results:  No results for input(s): "WBC", "HGB", "HCT", "PLT" in the last 72 hours. BMET Recent Labs    03/20/22 0526 03/20/22 1358 03/21/22 0529  NA 134*  --  134*  K 3.7  --  4.2  CL 101  --  96*  CO2 25  --  27  GLUCOSE 87  --  89  BUN 20  --  21*  CREATININE 0.71  --  0.78  CALCIUM 7.1* 8.1* 8.0*   PT/INR No results for input(s): "LABPROT", "INR" in the last 72 hours. Comprehensive Metabolic Panel:    Component Value Date/Time   NA 134 (L) 03/21/2022 0529   NA 134 (L) 03/20/2022 0526   NA 136 09/23/2021 1107   NA 137 11/11/2020 1333   K 4.2 03/21/2022 0529   K 3.7 03/20/2022 0526   CL 96 (L) 03/21/2022 0529   CL 101 03/20/2022 0526   CO2 27 03/21/2022 0529   CO2 25 03/20/2022 0526   BUN 21 (H) 03/21/2022 0529   BUN 20  03/20/2022 0526   BUN 11 09/23/2021 1107   BUN 11 11/11/2020 1333   CREATININE 0.78 03/21/2022 0529   CREATININE 0.71 03/20/2022 0526   CREATININE 0.56 06/20/2014 1622   GLUCOSE 89 03/21/2022 0529   GLUCOSE 87 03/20/2022 0526   CALCIUM 8.0 (L) 03/21/2022 0529   CALCIUM 8.1 (L) 03/20/2022 1358   AST 20 09/23/2021 1107   AST 21 11/11/2020 1333   ALT 13 09/23/2021 1107   ALT 23 11/11/2020 1333   ALKPHOS 112 09/23/2021 1107   ALKPHOS 110 11/11/2020 1333   BILITOT 0.4 09/23/2021 1107   BILITOT 0.4 11/11/2020 1333   PROT 6.8 09/23/2021 1107   PROT 7.8 11/11/2020 1333   ALBUMIN 4.1 09/23/2021 1107   ALBUMIN 4.8 11/11/2020 1333    Studies/Results: No results found.    Armandina Gemma 03/21/2022  Patient ID: Jennifer Stanton, female   DOB: 05/16/1960, 62 y.o.   MRN: [id number]

## 2022-03-22 ENCOUNTER — Other Ambulatory Visit (HOSPITAL_COMMUNITY): Payer: Self-pay

## 2022-03-22 LAB — CALCIUM: Calcium: 8.7 mg/dL — ABNORMAL LOW (ref 8.9–10.3)

## 2022-03-22 MED ORDER — CALCITRIOL 0.25 MCG PO CAPS
0.2500 ug | ORAL_CAPSULE | Freq: Every day | ORAL | 0 refills | Status: DC
  Filled 2022-03-22 – 2022-03-23 (×2): qty 10, 10d supply, fill #0

## 2022-03-22 NOTE — Progress Notes (Signed)
Reviewed D/C instructions with pt and all questions answered. Pt verbalized understanding. Pt left with all belongings in [place] in stable condition

## 2022-03-22 NOTE — Discharge Summary (Signed)
Physician Discharge Summary   Patient ID: Jennifer Stanton MRN: [id number] DOB/AGE: 60-Dec-1962 60 y.o.  Admit date: 03/18/2022  Discharge date: 03/22/2022  Discharge Diagnoses:  Principal Problem:   Hyperthyroidism   Discharged Condition: good  Hospital Course: Patient was admitted for observation following thyroidectomy.  Post op course was complicated by hypocalcemia.  She was given IV calcium gluconate, oral calcium carbonate, calcitriol orally with stabilization of calcium levels and relief of symptoms.  Pain was well controlled.  Tolerated diet.  Calcium level on morning of discharge was 8.7 mg/dl.  Patient was prepared for discharge home on POD#4.  Consults: None  Treatments: surgery: total thyroidectomy  Discharge Exam: Blood pressure (!) 105/58, pulse (!) 51, temperature 98.2 F (36.8 C), temperature source Oral, resp. rate 16, height 5\' 6"  (1.676 m), weight 62.7 kg, SpO2 95 %. HEENT - clear Neck - wound dry and intact; Dermabond in place; mild STS and ecchymosis; voice normal  Disposition: Home  Discharge Instructions     Diet - low sodium heart healthy   Complete by: As directed    Increase activity slowly   Complete by: As directed    No dressing needed   Complete by: As directed       Allergies as of 03/22/2022       Reactions   Codeine Rash        Medication List     TAKE these medications    albuterol 108 (90 Base) MCG/ACT inhaler Commonly known as: Ventolin HFA INHALE 2 PUFFS INTO THE LUNGS EVERY 4 (FOUR) HOURS AS NEEDED FOR WHEEZING OR SHORTNESS OF BREATH (COUGH, SHORTNESS OF BREATH OR WHEEZING.).   amitriptyline 50 MG tablet Commonly known as: ELAVIL TAKE 1 TABLET (50 MG TOTAL) BY MOUTH AT BEDTIME.   calcitRIOL 0.25 MCG capsule Commonly known as: Rocaltrol Take 1 capsule (0.25 mcg total) by mouth daily.   calcium carbonate 500 MG chewable tablet Commonly known as: Tums Chew 2 tablets (400 mg of elemental calcium total) by mouth  3 (three) times daily.   ibuprofen 200 MG tablet Commonly known as: ADVIL Take 3-4 tablets (600-800 mg total) by mouth every 8 (eight) hours as needed for moderate pain or headache.   levothyroxine 88 MCG tablet Commonly known as: Synthroid Take 1 tablet (88 mcg total) by mouth daily before breakfast.   metoprolol tartrate 25 MG tablet Commonly known as: LOPRESSOR TAKE 2 TABLETS BY MOUTH ONCE DAILY IN THE MORNING AND 1 TABLET ONCE DAILY IN THE EVENING.   montelukast 10 MG tablet Commonly known as: SINGULAIR Take 1 tablet (10 mg total) by mouth at bedtime.   traMADol 50 MG tablet Commonly known as: ULTRAM Take 1-2 tablets (50-100 mg total) by mouth every 6 (six) hours as needed.               Discharge Care Instructions  (From admission, onward)           Start     Ordered   03/22/22 0000  No dressing needed        03/22/22 1062            Follow-up Information     Armandina Gemma, MD. Schedule an appointment as soon as possible for a visit in 3 week(s).   Specialty: General Surgery Why: For wound re-check Contact information: [address] [place] [phone]                 Sherren Mocha  Harlow Asa, [facility] Office: [phone]   Signed: Armandina Gemma 03/22/2022, 9:22 AM

## 2022-03-23 ENCOUNTER — Other Ambulatory Visit: Payer: Self-pay

## 2022-03-23 ENCOUNTER — Other Ambulatory Visit (HOSPITAL_COMMUNITY): Payer: Self-pay

## 2022-03-24 ENCOUNTER — Other Ambulatory Visit: Payer: Self-pay

## 2022-03-24 ENCOUNTER — Other Ambulatory Visit (HOSPITAL_COMMUNITY): Payer: Self-pay

## 2022-03-24 ENCOUNTER — Encounter (HOSPITAL_COMMUNITY): Payer: Self-pay

## 2022-03-24 ENCOUNTER — Ambulatory Visit: Payer: 59 | Admitting: Nurse Practitioner

## 2022-03-25 ENCOUNTER — Other Ambulatory Visit: Payer: Self-pay

## 2022-03-25 ENCOUNTER — Other Ambulatory Visit (HOSPITAL_COMMUNITY): Payer: Self-pay

## 2022-03-28 ENCOUNTER — Other Ambulatory Visit (HOSPITAL_COMMUNITY): Payer: Self-pay

## 2022-04-04 ENCOUNTER — Other Ambulatory Visit: Payer: Self-pay

## 2022-04-04 ENCOUNTER — Other Ambulatory Visit: Payer: Self-pay | Admitting: Nurse Practitioner

## 2022-04-04 MED ORDER — METOPROLOL TARTRATE 25 MG PO TABS
ORAL_TABLET | ORAL | 0 refills | Status: DC
  Filled 2022-04-04 – 2022-04-05 (×2): qty 90, 30d supply, fill #0

## 2022-04-05 ENCOUNTER — Other Ambulatory Visit: Payer: Self-pay

## 2022-04-07 ENCOUNTER — Other Ambulatory Visit: Payer: Self-pay

## 2022-04-08 ENCOUNTER — Ambulatory Visit (INDEPENDENT_AMBULATORY_CARE_PROVIDER_SITE_OTHER): Payer: Commercial Managed Care - HMO | Admitting: Nurse Practitioner

## 2022-04-08 ENCOUNTER — Encounter: Payer: Self-pay | Admitting: Nurse Practitioner

## 2022-04-08 ENCOUNTER — Other Ambulatory Visit: Payer: Self-pay

## 2022-04-08 VITALS — BP 130/71 | HR 63 | Temp 97.7°F | Ht 64.5 in | Wt 141.8 lb

## 2022-04-08 DIAGNOSIS — R42 Dizziness and giddiness: Secondary | ICD-10-CM | POA: Diagnosis not present

## 2022-04-08 DIAGNOSIS — Z1211 Encounter for screening for malignant neoplasm of colon: Secondary | ICD-10-CM | POA: Diagnosis not present

## 2022-04-08 DIAGNOSIS — Z1231 Encounter for screening mammogram for malignant neoplasm of breast: Secondary | ICD-10-CM | POA: Diagnosis not present

## 2022-04-08 DIAGNOSIS — E0590 Thyrotoxicosis, unspecified without thyrotoxic crisis or storm: Secondary | ICD-10-CM | POA: Diagnosis not present

## 2022-04-08 LAB — POCT GLUCOSE (DEVICE FOR HOME USE)
Glucose Fasting, POC: 96 mg/dL (ref 70–99)
POC Glucose: 96 mg/dl (ref 70–99)

## 2022-04-08 MED ORDER — LEVOTHYROXINE SODIUM 88 MCG PO TABS
88.0000 ug | ORAL_TABLET | Freq: Every day | ORAL | 1 refills | Status: DC
  Filled 2022-04-08 – 2022-04-18 (×4): qty 90, 90d supply, fill #0
  Filled 2022-04-20 – 2022-04-22 (×2): qty 30, 30d supply, fill #0
  Filled 2022-05-16: qty 30, 30d supply, fill #1
  Filled 2022-06-20: qty 30, 30d supply, fill #2
  Filled 2022-07-18: qty 30, 30d supply, fill #0
  Filled 2022-07-18: qty 30, 30d supply, fill #3
  Filled 2022-07-18: qty 30, 30d supply, fill #0
  Filled 2022-08-16: qty 30, 30d supply, fill #1
  Filled 2022-09-12: qty 30, 30d supply, fill #2

## 2022-04-08 NOTE — Progress Notes (Signed)
@Patient  ID: Jennifer Stanton, female    DOB: 03-29-1960, 62 y.o.   MRN: [id number]  Chief Complaint  Patient presents with   Follow-up    Had thyroid taken out 3 weeks ago, calcium dropped low and she is dizzy today. Dr Valda Favia wanted her calcium levels checked today.     Referring provider: Fenton Foy, NP   HPI  Patient presents today for follow-up visit.  She recently had complete thyroidectomy on 03/18/2022.  She is following with endocrinology and does have an appointment set up for the end of the month.  She states that she is having a hard time keeping her calcium level up in the hospital.  She states that she is dizzy today.  Vital signs are stable and blood sugar stable in office today.  We will check labs including calcium level today. Denies f/c/s, n/v/d, hemoptysis, PND, leg swelling Denies chest pain or edema       Allergies  Allergen Reactions   Codeine Rash    Immunization History  Administered Date(s) Administered   Influenza,inj,Quad PF,6+ Mos 04/28/2017, 02/13/2018   PFIZER(Purple Top)SARS-COV-2 Vaccination 07/10/2019, 08/01/2019, 05/13/2020   Tdap 01/01/2013    Past Medical History:  Diagnosis Date   Abnormal cells of cervix    Anemia    Anxiety    Blurry vision 06/20/2014   Cephalalgia 06/20/2014   COPD (chronic obstructive pulmonary disease) ([place])    Grieving 04/2019   Heart palpitations    History of seizure 06/20/2014   Hyperthyroidism    Marijuana use 04/2019   Otitis media    PONV (postoperative nausea and vomiting)    Right ear pain    Seizures ([facility])    Sinus tachycardia    Thyroid disease     Tobacco History: Social History   Tobacco Use  Smoking Status Every Day   Packs/day: 0.10   Years: 48.00   Total pack years: 4.80   Types: Cigarettes  Smokeless Tobacco Never   Ready to quit: Not Answered Counseling given: Not Answered   Outpatient Encounter Medications as of 04/08/2022  Medication Sig   albuterol  (VENTOLIN HFA) 108 (90 Base) MCG/ACT inhaler INHALE 2 PUFFS INTO THE LUNGS EVERY 4 (FOUR) HOURS AS NEEDED FOR WHEEZING OR SHORTNESS OF BREATH (COUGH, SHORTNESS OF BREATH OR WHEEZING.).   amitriptyline (ELAVIL) 50 MG tablet TAKE 1 TABLET (50 MG TOTAL) BY MOUTH AT BEDTIME.   calcitRIOL (ROCALTROL) 0.25 MCG capsule Take 1 capsule (0.25 mcg total) by mouth daily.   calcium carbonate (TUMS) 500 MG chewable tablet Chew 2 tablets (400 mg of elemental calcium total) by mouth 3 (three) times daily.   ibuprofen (ADVIL) 200 MG tablet Take 3-4 tablets (600-800 mg total) by mouth every 8 (eight) hours as needed for moderate pain or headache.   metoprolol tartrate (LOPRESSOR) 25 MG tablet TAKE 2 TABLETS BY MOUTH ONCE DAILY IN THE MORNING AND 1 TABLET ONCE DAILY IN THE EVENING.   metoprolol tartrate (LOPRESSOR) 25 MG tablet Take 2 tablets (50 mg total) by mouth in the morning AND 1 tablet (25 mg total) every evening.   montelukast (SINGULAIR) 10 MG tablet Take 1 tablet (10 mg total) by mouth at bedtime.   traMADol (ULTRAM) 50 MG tablet Take 1-2 tablets (50-100 mg total) by mouth every 6 (six) hours as needed.   [DISCONTINUED] levothyroxine (SYNTHROID) 88 MCG tablet Take 1 tablet (88 mcg total) by mouth daily before breakfast.   levothyroxine (SYNTHROID) 88 MCG tablet Take 1 tablet (  88 mcg total) by mouth daily before breakfast.   No facility-administered encounter medications on file as of 04/08/2022.     Review of Systems  Review of Systems  Constitutional: Negative.   HENT: Negative.    Cardiovascular: Negative.   Gastrointestinal: Negative.   Allergic/Immunologic: Negative.   Neurological:  Positive for dizziness.  Psychiatric/Behavioral: Negative.         Physical Exam  BP 130/71   Pulse 63   Temp 97.7 F (36.5 C) (Temporal)   Ht 5' 4.5" (1.638 m)   Wt 141 lb 12.8 oz (64.3 kg)   SpO2 98%   BMI 23.96 kg/m   Wt Readings from Last 5 Encounters:  04/08/22 141 lb 12.8 oz (64.3 kg)  03/18/22  138 lb 3.7 oz (62.7 kg)  03/11/22 138 lb 3.2 oz (62.7 kg)  01/10/22 133 lb (60.3 kg)  09/23/21 131 lb 4 oz (59.5 kg)     Physical Exam Vitals and nursing note reviewed.  Constitutional:      General: She is not in acute distress.    Appearance: She is well-developed.  Cardiovascular:     Rate and Rhythm: Normal rate and regular rhythm.  Pulmonary:     Effort: Pulmonary effort is normal.     Breath sounds: Normal breath sounds.  Neurological:     Mental Status: She is alert and oriented to person, place, and time.      Lab Results:  CBC    Component Value Date/Time   WBC 5.4 03/11/2022 1354   RBC 4.83 03/11/2022 1354   HGB 14.6 03/11/2022 1354   HGB 14.6 09/23/2021 1107   HCT 42.7 03/11/2022 1354   HCT 43.2 09/23/2021 1107   PLT 255 03/11/2022 1354   PLT 214 09/23/2021 1107   MCV 88.4 03/11/2022 1354   MCV 84 09/23/2021 1107   MCH 30.2 03/11/2022 1354   MCHC 34.2 03/11/2022 1354   RDW 13.2 03/11/2022 1354   RDW 12.9 09/23/2021 1107   LYMPHSABS 2.7 11/11/2020 1333   MONOABS 0.5 02/14/2017 1658   EOSABS 0.0 11/11/2020 1333   BASOSABS 0.0 11/11/2020 1333    BMET    Component Value Date/Time   NA 134 (L) 03/21/2022 0529   NA 136 09/23/2021 1107   K 4.2 03/21/2022 0529   CL 96 (L) 03/21/2022 0529   CO2 27 03/21/2022 0529   GLUCOSE 89 03/21/2022 0529   BUN 21 (H) 03/21/2022 0529   BUN 11 09/23/2021 1107   CREATININE 0.78 03/21/2022 0529   CREATININE 0.56 06/20/2014 1622   CALCIUM 8.7 (L) 03/22/2022 0441   GFRNONAA >60 03/21/2022 0529   GFRNONAA >89 06/20/2014 1622   GFRAA 123 10/18/2019 1524   GFRAA >89 06/20/2014 1622    BNP No results found for: "BNP"  ProBNP No results found for: "PROBNP"  Imaging: No results found.   Assessment & Plan:   Colon cancer screening - Cologuard - MM Digital Screening  2. Encounter for screening mammogram for malignant neoplasm of breast   3. Hyperthyroidism  - CBC - Comprehensive metabolic  panel   Follow up:  Follow up in 6 months     Fenton Foy, NP 04/08/2022

## 2022-04-08 NOTE — Patient Instructions (Signed)
1. Colon cancer screening  - Cologuard - MM Digital Screening  2. Encounter for screening mammogram for malignant neoplasm of breast   3. Hyperthyroidism  - CBC - Comprehensive metabolic panel   Follow up:  Follow up in 6 months

## 2022-04-08 NOTE — Assessment & Plan Note (Signed)
-   Cologuard - MM Digital Screening  2. Encounter for screening mammogram for malignant neoplasm of breast   3. Hyperthyroidism  - CBC - Comprehensive metabolic panel   Follow up:  Follow up in 6 months

## 2022-04-09 LAB — COMPREHENSIVE METABOLIC PANEL
ALT: 15 IU/L (ref 0–32)
AST: 18 IU/L (ref 0–40)
Albumin/Globulin Ratio: 1.3 (ref 1.2–2.2)
Albumin: 4.3 g/dL (ref 3.8–4.9)
Alkaline Phosphatase: 110 IU/L (ref 44–121)
BUN/Creatinine Ratio: 16 (ref 9–23)
BUN: 12 mg/dL (ref 6–24)
Bilirubin Total: 0.5 mg/dL (ref 0.0–1.2)
CO2: 24 mmol/L (ref 20–29)
Calcium: 8 mg/dL — ABNORMAL LOW (ref 8.7–10.2)
Chloride: 97 mmol/L (ref 96–106)
Creatinine, Ser: 0.75 mg/dL (ref 0.57–1.00)
Globulin, Total: 3.3 g/dL (ref 1.5–4.5)
Glucose: 94 mg/dL (ref 70–99)
Potassium: 4.1 mmol/L (ref 3.5–5.2)
Sodium: 143 mmol/L (ref 134–144)
Total Protein: 7.6 g/dL (ref 6.0–8.5)
eGFR: 92 mL/min/{1.73_m2} (ref >59)

## 2022-04-09 LAB — CBC
Hematocrit: 42.3 % (ref 34.0–46.6)
Hemoglobin: 14.6 g/dL (ref 11.1–15.9)
MCH: 30.3 pg (ref 26.6–33.0)
MCHC: 34.5 g/dL (ref 31.5–35.7)
MCV: 88 fL (ref 79–97)
Platelets: 328 10*3/uL (ref 150–450)
RBC: 4.82 x10E6/uL (ref 3.77–5.28)
RDW: 13.2 % (ref 11.7–15.4)
WBC: 7.2 10*3/uL (ref 3.4–10.8)

## 2022-04-13 ENCOUNTER — Other Ambulatory Visit: Payer: Self-pay

## 2022-04-14 ENCOUNTER — Other Ambulatory Visit: Payer: Self-pay

## 2022-04-18 ENCOUNTER — Other Ambulatory Visit: Payer: Self-pay

## 2022-04-20 ENCOUNTER — Other Ambulatory Visit: Payer: Self-pay

## 2022-04-21 ENCOUNTER — Other Ambulatory Visit: Payer: Self-pay

## 2022-04-22 ENCOUNTER — Other Ambulatory Visit: Payer: Self-pay

## 2022-04-24 ENCOUNTER — Encounter (HOSPITAL_BASED_OUTPATIENT_CLINIC_OR_DEPARTMENT_OTHER): Payer: Self-pay

## 2022-04-24 ENCOUNTER — Emergency Department (HOSPITAL_BASED_OUTPATIENT_CLINIC_OR_DEPARTMENT_OTHER): Payer: Commercial Managed Care - HMO

## 2022-04-24 ENCOUNTER — Emergency Department (HOSPITAL_BASED_OUTPATIENT_CLINIC_OR_DEPARTMENT_OTHER)
Admission: EM | Admit: 2022-04-24 | Discharge: 2022-04-24 | Disposition: A | Discharge status: Home / Self Care | Payer: Commercial Managed Care - HMO | Attending: Emergency Medicine | Admitting: Emergency Medicine

## 2022-04-24 DIAGNOSIS — R1111 Vomiting without nausea: Secondary | ICD-10-CM

## 2022-04-24 DIAGNOSIS — Z20822 Contact with and (suspected) exposure to covid-19: Secondary | ICD-10-CM | POA: Insufficient documentation

## 2022-04-24 DIAGNOSIS — I951 Orthostatic hypotension: Secondary | ICD-10-CM | POA: Insufficient documentation

## 2022-04-24 DIAGNOSIS — F17200 Nicotine dependence, unspecified, uncomplicated: Secondary | ICD-10-CM | POA: Insufficient documentation

## 2022-04-24 DIAGNOSIS — R42 Dizziness and giddiness: Secondary | ICD-10-CM | POA: Diagnosis present

## 2022-04-24 DIAGNOSIS — H6691 Otitis media, unspecified, right ear: Secondary | ICD-10-CM | POA: Diagnosis not present

## 2022-04-24 DIAGNOSIS — J449 Chronic obstructive pulmonary disease, unspecified: Secondary | ICD-10-CM | POA: Diagnosis not present

## 2022-04-24 DIAGNOSIS — R202 Paresthesia of skin: Secondary | ICD-10-CM | POA: Diagnosis not present

## 2022-04-24 LAB — RESP PANEL BY RT-PCR (RSV, FLU A&B, COVID)  RVPGX2
Influenza A by PCR: NEGATIVE
Influenza B by PCR: NEGATIVE
Resp Syncytial Virus by PCR: NEGATIVE
SARS Coronavirus 2 by RT PCR: NEGATIVE

## 2022-04-24 LAB — LIPASE, BLOOD: Lipase: 16 U/L (ref 11–51)

## 2022-04-24 LAB — CBC WITH DIFFERENTIAL/PLATELET
Abs Immature Granulocytes: 0.01 10*3/uL (ref 0.00–0.07)
Basophils Absolute: 0 10*3/uL (ref 0.0–0.1)
Basophils Relative: 1 %
Eosinophils Absolute: 0 10*3/uL (ref 0.0–0.5)
Eosinophils Relative: 0 %
HCT: 41.1 % (ref 36.0–46.0)
Hemoglobin: 14 g/dL (ref 12.0–15.0)
Immature Granulocytes: 0 %
Lymphocytes Relative: 55 %
Lymphs Abs: 4 10*3/uL (ref 0.7–4.0)
MCH: 30 pg (ref 26.0–34.0)
MCHC: 34.1 g/dL (ref 30.0–36.0)
MCV: 88.2 fL (ref 80.0–100.0)
Monocytes Absolute: 0.5 10*3/uL (ref 0.1–1.0)
Monocytes Relative: 7 %
Neutro Abs: 2.6 10*3/uL (ref 1.7–7.7)
Neutrophils Relative %: 37 %
Platelets: 274 10*3/uL (ref 150–400)
RBC: 4.66 MIL/uL (ref 3.87–5.11)
RDW: 13 % (ref 11.5–15.5)
WBC: 7.1 10*3/uL (ref 4.0–10.5)
nRBC: 0 % (ref 0.0–0.2)

## 2022-04-24 LAB — COMPREHENSIVE METABOLIC PANEL
ALT: 13 U/L (ref 0–44)
AST: 16 U/L (ref 15–41)
Albumin: 4.4 g/dL (ref 3.5–5.0)
Alkaline Phosphatase: 78 U/L (ref 38–126)
Anion gap: 8 (ref 5–15)
BUN: 18 mg/dL (ref 6–20)
CO2: 30 mmol/L (ref 22–32)
Calcium: 8.7 mg/dL — ABNORMAL LOW (ref 8.9–10.3)
Chloride: 97 mmol/L — ABNORMAL LOW (ref 98–111)
Creatinine, Ser: 0.7 mg/dL (ref 0.44–1.00)
GFR, Estimated: >60 mL/min (ref >60)
Glucose, Bld: 86 mg/dL (ref 70–99)
Potassium: 4.5 mmol/L (ref 3.5–5.1)
Sodium: 135 mmol/L (ref 135–145)
Total Bilirubin: 0.4 mg/dL (ref 0.3–1.2)
Total Protein: 7.8 g/dL (ref 6.5–8.1)

## 2022-04-24 LAB — TROPONIN I (HIGH SENSITIVITY): Troponin I (High Sensitivity): 2 ng/L (ref <18)

## 2022-04-24 LAB — CK: Total CK: 72 U/L (ref 38–234)

## 2022-04-24 MED ORDER — ONDANSETRON HCL 4 MG PO TABS
4.0000 mg | ORAL_TABLET | Freq: Four times a day (QID) | ORAL | 0 refills | Status: DC
  Filled 2022-04-24 – 2022-08-16 (×2): qty 5, 2d supply, fill #0

## 2022-04-24 MED ORDER — PROCHLORPERAZINE MALEATE 10 MG PO TABS
10.0000 mg | ORAL_TABLET | Freq: Once | ORAL | Status: AC
  Administered 2022-04-24: 10 mg via ORAL
  Filled 2022-04-24: qty 1

## 2022-04-24 MED ORDER — KETOROLAC TROMETHAMINE 60 MG/2ML IM SOLN
15.0000 mg | Freq: Once | INTRAMUSCULAR | Status: AC
  Administered 2022-04-24: 15 mg via INTRAMUSCULAR

## 2022-04-24 MED ORDER — PROCHLORPERAZINE EDISYLATE 10 MG/2ML IJ SOLN
10.0000 mg | Freq: Once | INTRAMUSCULAR | Status: DC
  Filled 2022-04-24: qty 2

## 2022-04-24 MED ORDER — AMOXICILLIN-POT CLAVULANATE 875-125 MG PO TABS
1.0000 | ORAL_TABLET | Freq: Two times a day (BID) | ORAL | 0 refills | Status: DC
  Filled 2022-04-24: qty 14, 7d supply, fill #0

## 2022-04-24 MED ORDER — KETOROLAC TROMETHAMINE 15 MG/ML IJ SOLN
15.0000 mg | Freq: Once | INTRAMUSCULAR | Status: DC
  Filled 2022-04-24: qty 1

## 2022-04-24 NOTE — ED Notes (Signed)
Lab contacted and they were going to add the Lipase and CK to the blood already sent.

## 2022-04-24 NOTE — Discharge Instructions (Addendum)
You were seen in the ER today for evaluation of your symptoms.  Your calcium is mildly low at 8.7 however is higher than it has been.  Your EKG is unchanged from previous.  The rest of your labs are unremarkable.  I am not sure what is causing the symptoms, it could be your body getting used to your thyroidectomy.  I would like for you to follow-up with your primary care doctor within the next few days for reevaluation.  You have declined any imaging of the abdomen or any ultrasounds of your gallbladder for vomiting.  I will send you home with a few Zofran medications to help with vomiting.  If you have any concerns, new or worsening symptoms, please return to the nurse for department for reevaluation.  Contact a doctor if: You have paresthesia that gets worse or does not go away. You lose feeling (have numbness) after an injury. Your burning or prickling feeling gets worse when you walk. You have pain or cramps. You feel dizzy or you faint. You have a rash. Get help right away if: You feel weak or have new weakness in an arm or leg. You have trouble walking or moving. You have problems speaking, understanding, or seeing. You feel confused. You cannot control when you pee (urinate) or poop (have a bowel movement). These symptoms may be an emergency. Get help right away. Call 911. Do not wait to see if the symptoms will go away. Do not drive yourself to the hospital.

## 2022-04-24 NOTE — ED Triage Notes (Signed)
Pt c/o tingling feet/ hands, mouth/ tongue, dizziness, emesis x2 today, associated fatigue, cramping.  Thyroidectomy 1/12, was hospitalized 5 days r/t hypocalcemia, concern for same.

## 2022-04-24 NOTE — ED Provider Notes (Signed)
[facility] Provider Note   CSN: [id number] Arrival date & time: 04/24/22  1807     History {Add pertinent medical, surgical, social history, OB history to HPI:1} Chief Complaint  Patient presents with   Dizziness   Numbness         Jennifer Stanton is a 62 y.o. female.   Dizziness      Home Medications Prior to Admission medications   Medication Sig Start Date End Date Taking? Authorizing Provider  albuterol (VENTOLIN HFA) 108 (90 Base) MCG/ACT inhaler INHALE 2 PUFFS INTO THE LUNGS EVERY 4 (FOUR) HOURS AS NEEDED FOR WHEEZING OR SHORTNESS OF BREATH (COUGH, SHORTNESS OF BREATH OR WHEEZING.). 10/29/21   Fenton Foy, NP  amitriptyline (ELAVIL) 50 MG tablet TAKE 1 TABLET (50 MG TOTAL) BY MOUTH AT BEDTIME. 11/10/21 11/10/22  Fenton Foy, NP  calcitRIOL (ROCALTROL) 0.25 MCG capsule Take 1 capsule (0.25 mcg total) by mouth daily. 03/22/22   Armandina Gemma, MD  calcium carbonate (TUMS) 500 MG chewable tablet Chew 2 tablets (400 mg of elemental calcium total) by mouth 3 (three) times daily. 03/18/22   Armandina Gemma, MD  ibuprofen (ADVIL) 200 MG tablet Take 3-4 tablets (600-800 mg total) by mouth every 8 (eight) hours as needed for moderate pain or headache. 03/09/22   Fenton Foy, NP  levothyroxine (SYNTHROID) 88 MCG tablet Take 1 tablet (88 mcg total) by mouth daily before breakfast. 04/08/22   Fenton Foy, NP  metoprolol tartrate (LOPRESSOR) 25 MG tablet TAKE 2 TABLETS BY MOUTH ONCE DAILY IN THE MORNING AND 1 TABLET ONCE DAILY IN THE EVENING. 11/10/21 11/10/22  Fenton Foy, NP  metoprolol tartrate (LOPRESSOR) 25 MG tablet Take 2 tablets (50 mg total) by mouth in the morning AND 1 tablet (25 mg total) every evening. 04/04/22   Fenton Foy, NP  montelukast (SINGULAIR) 10 MG tablet Take 1 tablet (10 mg total) by mouth at bedtime. 03/09/22   Fenton Foy, NP  traMADol (ULTRAM) 50 MG tablet Take 1-2 tablets (50-100 mg total) by  mouth every 6 (six) hours as needed. 03/18/22   Armandina Gemma, MD      Allergies    Codeine    Review of Systems   Review of Systems  Neurological:  Positive for dizziness.    Physical Exam Updated Vital Signs BP 111/62 (BP Location: Right Arm)   Pulse 63   Temp 98.5 F (36.9 C) (Oral)   Resp 20   SpO2 98%  Physical Exam  ED Results / Procedures / Treatments   Labs (all labs ordered are listed, but only abnormal results are displayed) Labs Reviewed  COMPREHENSIVE METABOLIC PANEL - Abnormal; Notable for the following components:      Result Value   Chloride 97 (*)    Calcium 8.7 (*)    All other components within normal limits  RESP PANEL BY RT-PCR (RSV, FLU A&B, COVID)  RVPGX2  CBC WITH DIFFERENTIAL/PLATELET  LIPASE, BLOOD  CK  TROPONIN I (HIGH SENSITIVITY)    EKG None  Radiology No results found.  Procedures Procedures  {Document cardiac monitor, telemetry assessment procedure when appropriate:1}  Medications Ordered in ED Medications  ketorolac (TORADOL) 15 MG/ML injection 15 mg (has no administration in time range)  prochlorperazine (COMPAZINE) injection 10 mg (has no administration in time range)    ED Course/ Medical Decision Making/ A&P   {   Click here for ABCD2, HEART and other calculatorsREFRESH Note before  signing :1}                          Medical Decision Making Amount and/or Complexity of Data Reviewed Labs: ordered. Radiology: ordered.  Risk Prescription drug management.   ***  {Document critical care time when appropriate:1} {Document review of labs and clinical decision tools ie heart score, Chads2Vasc2 etc:1}  {Document your independent review of radiology images, and any outside records:1} {Document your discussion with family members, caretakers, and with consultants:1} {Document social determinants of health affecting pt's care:1} {Document your decision making why or why not admission, treatments were needed:1} Final  Clinical Impression(s) / ED Diagnoses Final diagnoses:  None    Rx / DC Orders ED Discharge Orders     None

## 2022-04-25 ENCOUNTER — Other Ambulatory Visit: Payer: Self-pay

## 2022-04-25 ENCOUNTER — Telehealth: Payer: Self-pay

## 2022-04-25 NOTE — Transitions of Care (Post Inpatient/ED Visit) (Signed)
   04/25/2022  Name: Jennifer Stanton MRN: [id number] DOB: 1960-05-20  Today's TOC FU Call Status: Today's TOC FU Call Status:: Unsuccessul Call (1st Attempt) Unsuccessful Call (1st Attempt) Date: 04/25/22  Attempted to reach the patient regarding the most recent Inpatient/ED visit.  Follow Up Plan: Additional outreach attempts will be made to reach the patient to complete the Transitions of Care (Post Inpatient/ED visit) call.   Signature,   Elyse Jarvis RMA

## 2022-04-27 ENCOUNTER — Other Ambulatory Visit: Payer: Self-pay | Admitting: Nurse Practitioner

## 2022-04-27 DIAGNOSIS — R195 Other fecal abnormalities: Secondary | ICD-10-CM

## 2022-04-27 LAB — COLOGUARD: COLOGUARD: POSITIVE — AB

## 2022-04-28 ENCOUNTER — Other Ambulatory Visit: Payer: Self-pay | Admitting: Nurse Practitioner

## 2022-04-29 ENCOUNTER — Other Ambulatory Visit: Payer: Self-pay

## 2022-04-30 ENCOUNTER — Other Ambulatory Visit: Payer: Self-pay | Admitting: Nurse Practitioner

## 2022-05-02 ENCOUNTER — Other Ambulatory Visit: Payer: Self-pay

## 2022-05-02 ENCOUNTER — Other Ambulatory Visit: Payer: Self-pay | Admitting: Nurse Practitioner

## 2022-05-03 ENCOUNTER — Other Ambulatory Visit: Payer: Self-pay

## 2022-05-03 ENCOUNTER — Telehealth: Payer: Self-pay

## 2022-05-03 MED ORDER — METOPROLOL TARTRATE 25 MG PO TABS: 25.0000 mg | ORAL_TABLET | Freq: Three times a day (TID) | ORAL | 0 refills | Status: DC

## 2022-05-03 MED ORDER — METOPROLOL TARTRATE 25 MG PO TABS
ORAL_TABLET | ORAL | 0 refills | Status: DC
  Filled 2022-05-03: qty 270, 90d supply, fill #0

## 2022-05-03 NOTE — Telephone Encounter (Signed)
Pt was advised that med was sent in . [facility]

## 2022-05-04 ENCOUNTER — Other Ambulatory Visit: Payer: Self-pay

## 2022-05-16 ENCOUNTER — Other Ambulatory Visit: Payer: Self-pay

## 2022-05-17 ENCOUNTER — Other Ambulatory Visit: Payer: Self-pay

## 2022-05-18 ENCOUNTER — Other Ambulatory Visit (HOSPITAL_COMMUNITY): Payer: Self-pay

## 2022-05-20 ENCOUNTER — Other Ambulatory Visit: Payer: Self-pay

## 2022-05-26 ENCOUNTER — Ambulatory Visit: Payer: Commercial Managed Care - HMO | Admitting: Internal Medicine

## 2022-06-20 ENCOUNTER — Other Ambulatory Visit: Payer: Self-pay

## 2022-06-20 ENCOUNTER — Encounter: Payer: Self-pay | Admitting: *Deleted

## 2022-07-18 ENCOUNTER — Encounter: Payer: Self-pay | Admitting: Pharmacist

## 2022-07-18 ENCOUNTER — Other Ambulatory Visit: Payer: Self-pay

## 2022-07-18 ENCOUNTER — Encounter (HOSPITAL_COMMUNITY): Payer: Self-pay

## 2022-07-18 ENCOUNTER — Other Ambulatory Visit (HOSPITAL_COMMUNITY): Payer: Self-pay

## 2022-07-22 ENCOUNTER — Other Ambulatory Visit: Payer: Self-pay

## 2022-07-22 NOTE — Progress Notes (Signed)
   Layan Derflinger June 18, 1960 [id number]  Talked to patient about getting a home BP cuff  Patient seen by Francetta Found, PharmD Candidate on 07/22/2022 while they were picking up prescriptions at [facility].  Blood Pressure Readings: Last documented ambulatory systolic blood pressure: 110 Last documented ambulatory diastolic blood pressure: 63 Systolic BP today: 138 Diastolic BP today: 86 Does the patient have a validated home blood pressure machine?: No   Medication review was performed. Is the patient taking their medications as prescribed?: Yes   The following barriers to adherence were noted: Does the patient have cost concerns?: Yes Does the patient have transportation concerns?: No Does the patient need assistance obtaining refills?: No Does the patient occassionally forget to take some of their prescribed medications?: No Does the patient feel like one/some of their medications make them feel poorly?: No Does the patient have questions or concerns about their medications?: No Does the patient have a follow up scheduled with their primary care provider/cardiologist?: Yes   Interventions: Interventions Completed: Medications were reviewed, Patient was educated on goal blood pressures and long term health implications of elevated blood pressure  The patient has follow up scheduled:  PCP: Ivonne Andrew, NP   Francetta Found, Student-PharmD

## 2022-08-04 ENCOUNTER — Other Ambulatory Visit: Payer: Self-pay | Admitting: Nurse Practitioner

## 2022-08-05 ENCOUNTER — Other Ambulatory Visit: Payer: Self-pay

## 2022-08-05 MED ORDER — METOPROLOL TARTRATE 25 MG PO TABS
ORAL_TABLET | ORAL | 0 refills | Status: DC
  Filled 2022-08-05: qty 90, 30d supply, fill #0
  Filled 2022-09-02: qty 90, 30d supply, fill #1

## 2022-08-08 ENCOUNTER — Other Ambulatory Visit: Payer: Self-pay

## 2022-08-16 ENCOUNTER — Other Ambulatory Visit: Payer: Self-pay

## 2022-08-19 ENCOUNTER — Other Ambulatory Visit: Payer: Self-pay

## 2022-09-13 ENCOUNTER — Other Ambulatory Visit: Payer: Self-pay

## 2022-09-27 ENCOUNTER — Telehealth: Payer: Self-pay

## 2022-09-27 NOTE — Telephone Encounter (Signed)
Called and lvm for pt to call and advise if she would like to have a mammo here 8/6/ or 9/3. If so she will need to need to scheduled. Kh

## 2022-10-07 ENCOUNTER — Ambulatory Visit
Admission: EM | Admit: 2022-10-07 | Discharge: 2022-10-07 | Disposition: A | Discharge status: Home / Self Care | Payer: 59 | Attending: Nurse Practitioner | Admitting: Nurse Practitioner

## 2022-10-07 ENCOUNTER — Ambulatory Visit: Payer: Self-pay | Admitting: Nurse Practitioner

## 2022-10-07 DIAGNOSIS — Z23 Encounter for immunization: Secondary | ICD-10-CM

## 2022-10-07 DIAGNOSIS — S6992XA Unspecified injury of left wrist, hand and finger(s), initial encounter: Secondary | ICD-10-CM | POA: Diagnosis not present

## 2022-10-07 DIAGNOSIS — W232XXA Caught, crushed, jammed or pinched between a moving and stationary object, initial encounter: Secondary | ICD-10-CM | POA: Diagnosis not present

## 2022-10-07 MED ORDER — TETANUS-DIPHTH-ACELL PERTUSSIS 5-2.5-18.5 LF-MCG/0.5 IM SUSY
0.5000 mL | PREFILLED_SYRINGE | Freq: Once | INTRAMUSCULAR | Status: AC
  Administered 2022-10-07: 0.5 mL via INTRAMUSCULAR

## 2022-10-07 NOTE — Discharge Instructions (Signed)
Tdap was updated today.  It is good for the next 10 years. Keep the dressing in place for the next 24 hours.  When you remove the dressing, cleanse the area with warm water, or an antibacterial soap.  Cover the area to prevent further injury or trauma. Apply ice to the left index finger as needed for pain, swelling, or general discomfort. Continue to monitor the area for worsening symptoms to include increased redness, swelling, or foul-smelling drainage.  If you notice any of the symptoms, please follow-up in this clinic or with your primary care physician for further evaluation. Follow-up as needed.

## 2022-10-07 NOTE — ED Notes (Signed)
Site cleansed with hibiclense. Bacitracin applied. Nonadherent pad placed, secured with coban. Pt tolerated well.  Site management and infection prevention education reviewed. Pt verbalized understanding.

## 2022-10-07 NOTE — ED Triage Notes (Signed)
Pt c/o finger injury, pt was at work and shut her left pointer finger in the jewelry, occurred a few hours ago.

## 2022-10-07 NOTE — ED Provider Notes (Signed)
[facility]    CSN: [id number] Arrival date & time: 10/07/22  1944      History   Chief Complaint No chief complaint on file.   HPI Jennifer Stanton is a 62 y.o. female.   The history is provided by the patient.   The patient presents for complaints of an injury to the left index finger that occurred today.  Patient states she was at work closing a door case, when the skin of the tip of the index finger got caught in the case.  She denies involvement of the bone.  She states that the area bled through 5 Band-Aids.  She complains of a "throbbing" pain to the tip of the left index finger.  Patient denies fever, chills, numbness, tingling, or radiation of pain.  Patient's last tetanus shot was in October 2014.  Patient has elected not to file Worker's Comp.  Past Medical History:  Diagnosis Date   Abnormal cells of cervix    Anemia    Anxiety    Blurry vision 06/20/2014   Cephalalgia 06/20/2014   COPD (chronic obstructive pulmonary disease) (HCC)    Grieving 04/2019   Heart palpitations    History of seizure 06/20/2014   Hyperthyroidism    Marijuana use 04/2019   Otitis media    PONV (postoperative nausea and vomiting)    Right ear pain    Seizures (HCC)    Sinus tachycardia    Thyroid disease     Patient Active Problem List   Diagnosis Date Noted   Colon cancer screening 04/08/2022   Grieving 04/22/2019   Recent weight gain 10/16/2018   Other insomnia 10/16/2018   Right otitis media 08/13/2018   Ear pain, right 08/13/2018   Hyperthyroidism 08/13/2018   Palpitations 08/13/2018   History of seizure 06/20/2014   Blurry vision 06/20/2014   Cephalalgia 06/20/2014    Past Surgical History:  Procedure Laterality Date   CERVICAL CONE BIOPSY     INNER EAR SURGERY  at age 25   surgey on thumb  02/29/2020   THYROIDECTOMY N/A 03/18/2022   Procedure: TOTAL THYROIDECTOMY;  Surgeon: Darnell Level, MD;  Location: [facility];  Service: General;  Laterality: N/A;    TUBAL LIGATION  07/05/1991    OB History     Gravida  4   Para      Term      Preterm      AB  1   Living  2      SAB      IAB  1   Ectopic      Multiple      Live Births  3            Home Medications    Prior to Admission medications   Medication Sig Start Date End Date Taking? Authorizing Provider  albuterol (VENTOLIN HFA) 108 (90 Base) MCG/ACT inhaler INHALE 2 PUFFS INTO THE LUNGS EVERY 4 (FOUR) HOURS AS NEEDED FOR WHEEZING OR SHORTNESS OF BREATH (COUGH, SHORTNESS OF BREATH OR WHEEZING.). 10/29/21   Ivonne Andrew, NP  amitriptyline (ELAVIL) 50 MG tablet TAKE 1 TABLET (50 MG TOTAL) BY MOUTH AT BEDTIME. 11/10/21 11/10/22  Ivonne Andrew, NP  amoxicillin-clavulanate (AUGMENTIN) 875-125 MG tablet Take 1 tablet by mouth every 12 (twelve) hours. 04/24/22   Achille Rich, PA-C  calcitRIOL (ROCALTROL) 0.25 MCG capsule Take 1 capsule (0.25 mcg total) by mouth daily. 03/22/22   Darnell Level, MD  calcium carbonate (TUMS) 500 MG  chewable tablet Chew 2 tablets (400 mg of elemental calcium total) by mouth 3 (three) times daily. 03/18/22   Darnell Level, MD  ibuprofen (ADVIL) 200 MG tablet Take 3-4 tablets (600-800 mg total) by mouth every 8 (eight) hours as needed for moderate pain or headache. 03/09/22   Ivonne Andrew, NP  levothyroxine (SYNTHROID) 88 MCG tablet Take 1 tablet (88 mcg total) by mouth daily before breakfast. 04/08/22   Ivonne Andrew, NP  metoprolol tartrate (LOPRESSOR) 25 MG tablet TAKE 2 TABLETS BY MOUTH ONCE DAILY IN THE MORNING AND 1 TABLET ONCE DAILY IN THE EVENING. 05/03/22 08/01/22  Ivonne Andrew, NP  metoprolol tartrate (LOPRESSOR) 25 MG tablet Take 2 tablets (50 mg total) by mouth in the morning AND 1 tablet (25 mg total) every evening. 08/05/22   Ivonne Andrew, NP  montelukast (SINGULAIR) 10 MG tablet Take 1 tablet (10 mg total) by mouth at bedtime. 03/09/22   Ivonne Andrew, NP  ondansetron (ZOFRAN) 4 MG tablet Take 1 tablet (4 mg total) by mouth  every 6 (six) hours. 04/24/22   Achille Rich, PA-C  traMADol (ULTRAM) 50 MG tablet Take 1-2 tablets (50-100 mg total) by mouth every 6 (six) hours as needed. 03/18/22   Darnell Level, MD    Family History Family History  Problem Relation Age of Onset   Cancer Mother    COPD Mother    Breast cancer Mother 79   Heart disease Father    Hemochromatosis Brother     Social History Social History   Tobacco Use   Smoking status: Every Day    Current packs/day: 0.10    Average packs/day: 0.1 packs/day for 48.0 years (4.8 ttl pk-yrs)    Types: Cigarettes   Smokeless tobacco: Never  Vaping Use   Vaping status: Some Days  Substance Use Topics   Alcohol use: Not Currently   Drug use: Yes    Types: Marijuana    Comment: Uses daily     Allergies   Codeine   Review of Systems Review of Systems Per HPI  Physical Exam Triage Vital Signs ED Triage Vitals [10/07/22 1950]  Encounter Vitals Group     BP 126/82     Systolic BP Percentile      Diastolic BP Percentile      Pulse Rate 82     Resp 12     Temp 98.3 F (36.8 C)     Temp Source Oral     SpO2 95 %     Weight      Height      Head Circumference      Peak Flow      Pain Score      Pain Loc      Pain Education      Exclude from Growth Chart    No data found.  Updated Vital Signs BP 126/82 (BP Location: Right Arm)   Pulse 82   Temp 98.3 F (36.8 C) (Oral)   Resp 12   SpO2 95%   Visual Acuity Right Eye Distance:   Left Eye Distance:   Bilateral Distance:    Right Eye Near:   Left Eye Near:    Bilateral Near:     Physical Exam Vitals and nursing note reviewed.  Constitutional:      General: She is not in acute distress.    Appearance: Normal appearance.  Eyes:     Extraocular Movements: Extraocular movements intact.     Pupils:  Pupils are equal, round, and reactive to light.  Pulmonary:     Effort: Pulmonary effort is normal.  Musculoskeletal:     Cervical back: Normal range of motion.      Comments: Palmar aspect of the distal phalanx of the left index finger with a round area of bruising.  Area measures approximately 0.5 cm in diameter.  Bruising location consistent with where the skin was caught in the doorway case.  Bleeding is controlled at this time.  FROM of the left index finger.  Skin:    General: Skin is warm and dry.  Neurological:     General: No focal deficit present.     Mental Status: She is alert and oriented to person, place, and time.  Psychiatric:        Mood and Affect: Mood normal.        Behavior: Behavior normal.      UC Treatments / Results  Labs (all labs ordered are listed, but only abnormal results are displayed) Labs Reviewed - No data to display  EKG   Radiology No results found.  Procedures Procedures (including critical care time)  Medications Ordered in UC Medications - No data to display  Initial Impression / Assessment and Plan / UC Course  I have reviewed the triage vital signs and the nursing notes.  Pertinent labs & imaging results that were available during my care of the patient were reviewed by me and considered in my medical decision making (see chart for details).  The patient is well-appearing, she is in no acute distress, vital signs are stable.  Patient with an injury to the tip of the left index finger.  Skin of the tip of the left index finger was caught in a jewelry case.  No bony involvement, imaging was deferred.  Tdap updated.  Area was cleansed and antibiotic ointment along with gauze dressing and Coban applied to the left index finger.  Supportive care recommendations were provided and discussed with the patient to include over-the-counter analgesics for pain or discomfort, applying ice, and monitoring the area for worsening infection.  Patient was in agreement with this plan of care and verbalizes understanding.  All questions were answered.  Patient stable for discharge.  Final Clinical Impressions(s) / UC  Diagnoses   Final diagnoses:  None   Discharge Instructions   None    ED Prescriptions   None    PDMP not reviewed this encounter.   Abran Cantor, NP 10/07/22 2016

## 2022-10-10 ENCOUNTER — Encounter: Payer: Self-pay | Admitting: Nurse Practitioner

## 2022-10-10 ENCOUNTER — Other Ambulatory Visit: Payer: Self-pay

## 2022-10-10 ENCOUNTER — Ambulatory Visit (INDEPENDENT_AMBULATORY_CARE_PROVIDER_SITE_OTHER): Payer: 59 | Admitting: Nurse Practitioner

## 2022-10-10 ENCOUNTER — Other Ambulatory Visit: Payer: Self-pay | Admitting: Nurse Practitioner

## 2022-10-10 VITALS — BP 114/66 | HR 59 | Resp 16 | Wt 153.0 lb

## 2022-10-10 DIAGNOSIS — R221 Localized swelling, mass and lump, neck: Secondary | ICD-10-CM

## 2022-10-10 DIAGNOSIS — J449 Chronic obstructive pulmonary disease, unspecified: Secondary | ICD-10-CM

## 2022-10-10 DIAGNOSIS — Z0000 Encounter for general adult medical examination without abnormal findings: Secondary | ICD-10-CM | POA: Diagnosis not present

## 2022-10-10 DIAGNOSIS — Z13220 Encounter for screening for lipoid disorders: Secondary | ICD-10-CM

## 2022-10-10 MED ORDER — METOPROLOL TARTRATE 25 MG PO TABS
ORAL_TABLET | ORAL | 0 refills | Status: DC
  Filled 2022-10-10: qty 90, 30d supply, fill #0
  Filled 2022-11-07: qty 90, 30d supply, fill #1
  Filled 2022-12-08: qty 90, 30d supply, fill #2

## 2022-10-10 MED ORDER — AMITRIPTYLINE HCL 50 MG PO TABS
50.0000 mg | ORAL_TABLET | Freq: Every day | ORAL | 11 refills | Status: AC
  Filled 2022-10-10 (×2): qty 30, 30d supply, fill #0
  Filled 2022-12-08: qty 30, 30d supply, fill #1
  Filled 2023-02-05: qty 30, 30d supply, fill #2
  Filled 2023-02-25 – 2023-03-10 (×2): qty 30, 30d supply, fill #3

## 2022-10-10 MED ORDER — MONTELUKAST SODIUM 10 MG PO TABS
10.0000 mg | ORAL_TABLET | Freq: Every day | ORAL | 3 refills | Status: DC
  Filled 2022-10-10: qty 30, 30d supply, fill #0
  Filled 2022-11-07: qty 30, 30d supply, fill #1
  Filled 2022-12-08: qty 30, 30d supply, fill #2
  Filled 2023-01-05: qty 30, 30d supply, fill #3
  Filled 2023-01-25 – 2023-02-05 (×2): qty 30, 30d supply, fill #4
  Filled 2023-02-25 – 2023-03-10 (×2): qty 30, 30d supply, fill #5
  Filled 2023-05-12: qty 30, 30d supply, fill #7
  Filled 2023-06-14: qty 30, 30d supply, fill #8
  Filled 2023-07-12: qty 30, 30d supply, fill #9
  Filled 2023-08-12: qty 30, 30d supply, fill #10
  Filled 2023-09-21: qty 30, 30d supply, fill #11

## 2022-10-10 MED ORDER — ALBUTEROL SULFATE HFA 108 (90 BASE) MCG/ACT IN AERS
2.0000 | INHALATION_SPRAY | RESPIRATORY_TRACT | 11 refills | Status: AC | PRN
  Filled 2022-10-10: qty 6.7, 25d supply, fill #0
  Filled 2023-02-25: qty 6.7, 25d supply, fill #1
  Filled 2023-06-08: qty 6.7, 17d supply, fill #2
  Filled 2023-10-09: qty 6.7, 25d supply, fill #2

## 2022-10-10 NOTE — Assessment & Plan Note (Signed)
-   Lipid Panel   2. Routine adult health maintenance  - CBC - Comprehensive metabolic panel   3. Mass of left side of neck  - US Soft Tissue Head/Neck (NON-THYROID)   Follow up:  Follow up in 3 months

## 2022-10-10 NOTE — Progress Notes (Signed)
@Patient  ID: Jennifer Stanton, female    DOB: 02/23/1961, 62 y.o.   MRN: [id number]  Chief Complaint  Patient presents with   Follow-up    Referring provider: Ivonne Andrew, NP   HPI  62 year old female with history of thyroid disease, thyroidectomy (followed by endocrinology).  Patient presents today for follow-up visit.  Overall she has been doing well.  She is concerned about a nodule to her right neck.  She did have a complete thyroidectomy at the beginning of the year.  She does have an area of concern that is the size of a grape and is not mobile.  She states that it is not painful.  We will get ultrasound of her neck and send a message to her endocrinologist about this.  She does have an upcoming appointment with endocrinology in September.  Patient is due for blood work today. Denies f/c/s, n/v/d, hemoptysis, PND, leg swelling Denies chest pain or edema     Allergies  Allergen Reactions   Codeine Rash    Immunization History  Administered Date(s) Administered   Influenza,inj,Quad PF,6+ Mos 04/28/2017, 02/13/2018   PFIZER(Purple Top)SARS-COV-2 Vaccination 07/10/2019, 08/01/2019, 05/13/2020   Tdap 01/01/2013, 10/07/2022    Past Medical History:  Diagnosis Date   Abnormal cells of cervix    Anemia    Anxiety    Blurry vision 06/20/2014   Cephalalgia 06/20/2014   COPD (chronic obstructive pulmonary disease) (HCC)    Grieving 04/2019   Heart palpitations    History of seizure 06/20/2014   Hyperthyroidism    Marijuana use 04/2019   Otitis media    PONV (postoperative nausea and vomiting)    Right ear pain    Seizures (HCC)    Sinus tachycardia    Thyroid disease     Tobacco History: Social History   Tobacco Use  Smoking Status Every Day   Current packs/day: 0.10   Average packs/day: 0.1 packs/day for 48.0 years (4.8 ttl pk-yrs)   Types: Cigarettes  Smokeless Tobacco Never   Ready to quit: Not Answered Counseling given: Not  Answered   Outpatient Encounter Medications as of 10/10/2022  Medication Sig   albuterol (VENTOLIN HFA) 108 (90 Base) MCG/ACT inhaler INHALE 2 PUFFS INTO THE LUNGS EVERY 4 (FOUR) HOURS AS NEEDED FOR WHEEZING OR SHORTNESS OF BREATH (COUGH, SHORTNESS OF BREATH OR WHEEZING.).   amitriptyline (ELAVIL) 50 MG tablet TAKE 1 TABLET (50 MG TOTAL) BY MOUTH AT BEDTIME.   levothyroxine (SYNTHROID) 88 MCG tablet Take 1 tablet (88 mcg total) by mouth daily before breakfast.   metoprolol tartrate (LOPRESSOR) 25 MG tablet Take 2 tablets (50 mg total) by mouth in the morning AND 1 tablet (25 mg total) every evening.   montelukast (SINGULAIR) 10 MG tablet Take 1 tablet (10 mg total) by mouth at bedtime.   [DISCONTINUED] albuterol (VENTOLIN HFA) 108 (90 Base) MCG/ACT inhaler INHALE 2 PUFFS INTO THE LUNGS EVERY 4 (FOUR) HOURS AS NEEDED FOR WHEEZING OR SHORTNESS OF BREATH (COUGH, SHORTNESS OF BREATH OR WHEEZING.).   [DISCONTINUED] amitriptyline (ELAVIL) 50 MG tablet TAKE 1 TABLET (50 MG TOTAL) BY MOUTH AT BEDTIME.   [DISCONTINUED] amoxicillin-clavulanate (AUGMENTIN) 875-125 MG tablet Take 1 tablet by mouth every 12 (twelve) hours.   [DISCONTINUED] calcitRIOL (ROCALTROL) 0.25 MCG capsule Take 1 capsule (0.25 mcg total) by mouth daily.   [DISCONTINUED] calcium carbonate (TUMS) 500 MG chewable tablet Chew 2 tablets (400 mg of elemental calcium total) by mouth 3 (three) times daily.   [DISCONTINUED] ibuprofen (ADVIL)  200 MG tablet Take 3-4 tablets (600-800 mg total) by mouth every 8 (eight) hours as needed for moderate pain or headache.   [DISCONTINUED] metoprolol tartrate (LOPRESSOR) 25 MG tablet TAKE 2 TABLETS BY MOUTH ONCE DAILY IN THE MORNING AND 1 TABLET ONCE DAILY IN THE EVENING.   [DISCONTINUED] metoprolol tartrate (LOPRESSOR) 25 MG tablet Take 2 tablets (50 mg total) by mouth in the morning AND 1 tablet (25 mg total) every evening.   [DISCONTINUED] montelukast (SINGULAIR) 10 MG tablet Take 1 tablet (10 mg total) by  mouth at bedtime.   [DISCONTINUED] ondansetron (ZOFRAN) 4 MG tablet Take 1 tablet (4 mg total) by mouth every 6 (six) hours.   [DISCONTINUED] traMADol (ULTRAM) 50 MG tablet Take 1-2 tablets (50-100 mg total) by mouth every 6 (six) hours as needed.   No facility-administered encounter medications on file as of 10/10/2022.     Review of Systems  Review of Systems  Constitutional: Negative.   HENT: Negative.         Nodule to right neck  Cardiovascular: Negative.   Gastrointestinal: Negative.   Allergic/Immunologic: Negative.   Neurological: Negative.   Psychiatric/Behavioral: Negative.         Physical Exam  BP 114/66 (BP Location: Left Arm, Patient Position: Sitting, Cuff Size: Normal)   Pulse (!) 59   Resp 16   Wt 153 lb (69.4 kg)   SpO2 98%   BMI 25.86 kg/m   Wt Readings from Last 5 Encounters:  10/10/22 153 lb (69.4 kg)  04/08/22 141 lb 12.8 oz (64.3 kg)  03/18/22 138 lb 3.7 oz (62.7 kg)  03/11/22 138 lb 3.2 oz (62.7 kg)  01/10/22 133 lb (60.3 kg)     Physical Exam Vitals and nursing note reviewed.  Constitutional:      General: She is not in acute distress.    Appearance: She is well-developed.  Neck:     Thyroid: Thyroid mass present.   Cardiovascular:     Rate and Rhythm: Normal rate and regular rhythm.  Pulmonary:     Effort: Pulmonary effort is normal.     Breath sounds: Normal breath sounds.  Neurological:     Mental Status: She is alert and oriented to person, place, and time.      Lab Results:  CBC    Component Value Date/Time   WBC 7.1 04/24/2022 1845   RBC 4.66 04/24/2022 1845   HGB 14.0 04/24/2022 1845   HGB 14.6 04/08/2022 1141   HCT 41.1 04/24/2022 1845   HCT 42.3 04/08/2022 1141   PLT 274 04/24/2022 1845   PLT 328 04/08/2022 1141   MCV 88.2 04/24/2022 1845   MCV 88 04/08/2022 1141   MCH 30.0 04/24/2022 1845   MCHC 34.1 04/24/2022 1845   RDW 13.0 04/24/2022 1845   RDW 13.2 04/08/2022 1141   LYMPHSABS 4.0 04/24/2022 1845    LYMPHSABS 2.7 11/11/2020 1333   MONOABS 0.5 04/24/2022 1845   EOSABS 0.0 04/24/2022 1845   EOSABS 0.0 11/11/2020 1333   BASOSABS 0.0 04/24/2022 1845   BASOSABS 0.0 11/11/2020 1333    BMET    Component Value Date/Time   NA 135 04/24/2022 1845   NA 143 04/08/2022 1141   K 4.5 04/24/2022 1845   CL 97 (L) 04/24/2022 1845   CO2 30 04/24/2022 1845   GLUCOSE 86 04/24/2022 1845   BUN 18 04/24/2022 1845   BUN 12 04/08/2022 1141   CREATININE 0.70 04/24/2022 1845   CREATININE 0.56 06/20/2014 1622  CALCIUM 8.7 (L) 04/24/2022 1845   GFRNONAA >60 04/24/2022 1845   GFRNONAA >89 06/20/2014 1622   GFRAA 123 10/18/2019 1524   GFRAA >89 06/20/2014 1622      Assessment & Plan:   Lipid screening - Lipid Panel   2. Routine adult health maintenance  - CBC - Comprehensive metabolic panel   3. Mass of left side of neck  - US Soft Tissue Head/Neck (NON-THYROID)   Follow up:  Follow up in 3 months     Ivonne Andrew, NP 10/10/2022

## 2022-10-10 NOTE — Patient Instructions (Signed)
1. Lipid screening  - Lipid Panel   2. Routine adult health maintenance  - CBC - Comprehensive metabolic panel   3. Mass of left side of neck  - US Soft Tissue Head/Neck (NON-THYROID)   Follow up:  Follow up in 3 months

## 2022-10-10 NOTE — Progress Notes (Signed)
Pt presents for 61month f/u -has lump on right side of neck, had a thyroidectomy in Jan 24 -needs refill on Montelukast, metoprolol, levothyroxine, and albuterol inhaler

## 2022-10-12 ENCOUNTER — Other Ambulatory Visit: Payer: Self-pay

## 2022-10-12 ENCOUNTER — Other Ambulatory Visit: Payer: Self-pay | Admitting: Nurse Practitioner

## 2022-10-12 MED ORDER — ROSUVASTATIN CALCIUM 10 MG PO TABS
10.0000 mg | ORAL_TABLET | Freq: Every day | ORAL | 11 refills | Status: DC
  Filled 2022-10-12: qty 30, 30d supply, fill #0
  Filled 2022-11-07: qty 30, 30d supply, fill #1
  Filled 2022-12-08: qty 30, 30d supply, fill #2
  Filled 2023-01-05: qty 30, 30d supply, fill #3
  Filled 2023-01-25 – 2023-02-05 (×2): qty 30, 30d supply, fill #4
  Filled 2023-02-25 – 2023-03-10 (×2): qty 30, 30d supply, fill #5
  Filled 2023-04-15: qty 30, 30d supply, fill #6
  Filled 2023-05-12: qty 30, 30d supply, fill #7
  Filled 2023-06-07 – 2023-06-14 (×2): qty 30, 30d supply, fill #8
  Filled 2023-07-12: qty 30, 30d supply, fill #9
  Filled 2023-08-07: qty 30, 30d supply, fill #10
  Filled 2023-09-01: qty 30, 30d supply, fill #11

## 2022-10-12 MED ORDER — LEVOTHYROXINE SODIUM 88 MCG PO TABS
88.0000 ug | ORAL_TABLET | Freq: Every day | ORAL | 1 refills | Status: DC
  Filled 2022-10-12: qty 30, 30d supply, fill #0
  Filled 2022-11-07: qty 30, 30d supply, fill #1

## 2022-10-14 ENCOUNTER — Other Ambulatory Visit: Payer: Self-pay

## 2022-10-24 ENCOUNTER — Ambulatory Visit
Admission: RE | Admit: 2022-10-24 | Discharge: 2022-10-24 | Disposition: A | Discharge status: Home / Self Care | Payer: 59 | Source: Ambulatory Visit | Attending: Nurse Practitioner | Admitting: Nurse Practitioner

## 2022-10-24 DIAGNOSIS — R221 Localized swelling, mass and lump, neck: Secondary | ICD-10-CM | POA: Diagnosis not present

## 2022-10-26 ENCOUNTER — Other Ambulatory Visit: Payer: Self-pay

## 2022-10-28 ENCOUNTER — Telehealth: Payer: Self-pay

## 2022-10-28 ENCOUNTER — Other Ambulatory Visit: Payer: Self-pay | Admitting: Nurse Practitioner

## 2022-10-28 DIAGNOSIS — E215 Disorder of parathyroid gland, unspecified: Secondary | ICD-10-CM

## 2022-10-28 DIAGNOSIS — R9389 Abnormal findings on diagnostic imaging of other specified body structures: Secondary | ICD-10-CM

## 2022-10-28 NOTE — Telephone Encounter (Signed)
Patient is calling asking for results again on ultrasound.

## 2022-10-31 NOTE — Telephone Encounter (Signed)
Ultrasound results and result note from provider reviewed by patient through Mychart

## 2022-11-11 ENCOUNTER — Other Ambulatory Visit: Payer: Self-pay

## 2022-11-28 ENCOUNTER — Ambulatory Visit (INDEPENDENT_AMBULATORY_CARE_PROVIDER_SITE_OTHER): Payer: 59 | Admitting: Internal Medicine

## 2022-11-28 ENCOUNTER — Encounter: Payer: Self-pay | Admitting: Internal Medicine

## 2022-11-28 VITALS — BP 118/74 | HR 61 | Ht 64.5 in | Wt 155.4 lb

## 2022-11-28 DIAGNOSIS — R590 Localized enlarged lymph nodes: Secondary | ICD-10-CM

## 2022-11-28 DIAGNOSIS — E0590 Thyrotoxicosis, unspecified without thyrotoxic crisis or storm: Secondary | ICD-10-CM

## 2022-11-28 DIAGNOSIS — E890 Postprocedural hypothyroidism: Secondary | ICD-10-CM | POA: Diagnosis not present

## 2022-11-28 LAB — ALBUMIN: Albumin: 4.1 g/dL (ref 3.5–5.2)

## 2022-11-28 LAB — VITAMIN D 25 HYDROXY (VIT D DEFICIENCY, FRACTURES): VITD: 32.47 ng/mL (ref 30.00–100.00)

## 2022-11-28 LAB — TSH: TSH: 5.99 u[IU]/mL — ABNORMAL HIGH (ref 0.35–5.50)

## 2022-11-28 LAB — T4, FREE: Free T4: 0.85 ng/dL (ref 0.60–1.60)

## 2022-11-28 NOTE — Patient Instructions (Addendum)
Switch Tums to either Caltrate or Oscal-D , 1 tablet Twice daily      You are on levothyroxine - which is your thyroid hormone supplement. You MUST take this consistently.  You should take this first thing in the morning on an empty stomach with water. You should not take it with other medications. Wait to 1hr prior to eating. If you are taking any vitamins - please take these in the evening.   If you miss a dose, please take your missed dose the following day (double the dose for that day). You should have a pill box for ONLY levothyroxine on your bedside table to help you remember to take your medications.

## 2022-11-28 NOTE — Progress Notes (Unsigned)
Name: Jennifer Stanton  MRN/ DOB: [id number], April 10, 1960    Age/ Sex: 62 y.o., female    PCP: Ivonne Andrew, NP   Reason for Endocrinology Evaluation: Hyperthyroidism     Date of Initial Endocrinology Evaluation: 01/10/2022     HPI: Jennifer Stanton is a 62 y.o. female with a past medical history of Hyperthyroidism. The patient presented for initial endocrinology clinic visit on 01/10/2022 for consultative assistance with her Hyperthyroidism.   Pt has been diagnosed with hyperthyroidism since 2018. Has been on PTU since 2019.  No prior use of methimazole    Thyroid ultrasound did  NOT show any thyroid nodules in 2019 and 2020.   No FH of thyroid disease    NO prior radiation   She is S/P total thyroidectomy 03/18/2022, postoperative course was complicated by hypocalcemia, she received IV calcium gluconate as well as oral calcium carbonate and calcitriol   She was discharged on levothyroxine  SUBJECTIVE:    Today (11/28/22): Jennifer Stanton is here for a follow up on postoperative hypothyroidism. She is accompanied with her best friend Elon Jester   Patient has been noted with weight gain Has noted right neck swelling in 04/2022 , she had an ultrasound through her PCP , has noted an enlargement  Has occasional  palpitations , takes Metoprolol  Has diarrhea or loose stools  Has perioral tingling as well as hands and feet with muscle cramps   No Biotin   Levothyroxine 88 mcg daily She is on Tums      HISTORY:  Past Medical History:  Past Medical History:  Diagnosis Date   Abnormal cells of cervix    Anemia    Anxiety    Blurry vision 06/20/2014   Cephalalgia 06/20/2014   COPD (chronic obstructive pulmonary disease) (HCC)    Grieving 04/2019   Heart palpitations    History of seizure 06/20/2014   Hyperthyroidism    Marijuana use 04/2019   Otitis media    PONV (postoperative nausea and vomiting)    Right ear pain    Seizures (HCC)    Sinus  tachycardia    Thyroid disease    Past Surgical History:  Past Surgical History:  Procedure Laterality Date   CERVICAL CONE BIOPSY     INNER EAR SURGERY  at age 52   surgey on thumb  02/29/2020   THYROIDECTOMY N/A 03/18/2022   Procedure: TOTAL THYROIDECTOMY;  Surgeon: Darnell Level, MD;  Location: [facility];  Service: General;  Laterality: N/A;   TUBAL LIGATION  07/05/1991    Social History:  reports that she has been smoking cigarettes. She has a 4.8 pack-year smoking history. She has never used smokeless tobacco. She reports that she does not currently use alcohol. She reports current drug use. Drug: Marijuana. Family History: family history includes Breast cancer (age of onset: 40) in her mother; COPD in her mother; Cancer in her mother; Heart disease in her father; Hemochromatosis in her brother.   HOME MEDICATIONS: Allergies as of 11/28/2022       Reactions   Codeine Rash        Medication List        Accurate as of November 28, 2022  9:42 AM. If you have any questions, ask your nurse or doctor.          albuterol 108 (90 Base) MCG/ACT inhaler Commonly known as: Ventolin HFA Inhale 2 puffs into the lungs every 4 (four) hours as needed WHEEZING OR SHORTNESS  OF BREATH (COUGH, SHORTNESS OF BREATH OR WHEEZING.).   amitriptyline 50 MG tablet Commonly known as: ELAVIL Take 1 tablet (50 mg total) by mouth at bedtime.   levothyroxine 88 MCG tablet Commonly known as: Synthroid Take 1 tablet (88 mcg total) by mouth daily before breakfast.   metoprolol tartrate 25 MG tablet Commonly known as: LOPRESSOR Take 2 tablets (50 mg total) by mouth in the morning AND 1 tablet (25 mg total) every evening.   montelukast 10 MG tablet Commonly known as: SINGULAIR Take 1 tablet (10 mg total) by mouth at bedtime.   rosuvastatin 10 MG tablet Commonly known as: Crestor Take 1 tablet (10 mg total) by mouth daily.          REVIEW OF SYSTEMS: A comprehensive ROS was conducted with  the patient and is negative except as per HPI     OBJECTIVE:  VS: BP 118/74 (BP Location: Left Arm, Patient Position: Sitting, Cuff Size: Small)   Pulse 61   Ht 5' 4.5" (1.638 m)   Wt 155 lb 6.4 oz (70.5 kg)   SpO2 98%   BMI 26.26 kg/m    Wt Readings from Last 3 Encounters:  11/28/22 155 lb 6.4 oz (70.5 kg)  10/10/22 153 lb (69.4 kg)  04/08/22 141 lb 12.8 oz (64.3 kg)     EXAM: General: Pt appears well and is in NAD  Neck: General: Supple without adenopathy. Thyroid:  No goiter or nodules appreciated.  Right anterior neck few millimeter lymph node noted, soft and mobile  Lungs: Clear with good BS bilat   Heart: Auscultation: RRR.  Abdomen: Soft, nontender  Extremities:  BL LE: No pretibial edema   Mental Status: Judgment, insight: Intact Orientation: Oriented to time, place, and person Mood and affect: No depression, anxiety, or agitation     DATA REVIEWED:   Latest Reference Range & Units 11/28/22 10:18  Calcium 8.6 - 10.4 mg/dL 7.7 (L) (IP)  Albumin 3.5 - 5.2 g/dL 4.1    Latest Reference Range & Units 11/28/22 10:18  VITD 30.00 - 100.00 ng/mL 32.47    Latest Reference Range & Units 11/28/22 10:18  TSH 0.35 - 5.50 uIU/mL 5.99 (H)  Triiodothyronine (T3) 76 - 181 ng/dL 89  Z6,XWRU(EAVWUJ) 8.11 - 1.60 ng/dL 9.14  (H): Data is abnormally high   Latest Reference Range & Units 10/10/22 14:38  Sodium 134 - 144 mmol/L 139  Potassium 3.5 - 5.2 mmol/L 4.2  Chloride 96 - 106 mmol/L 101  CO2 20 - 29 mmol/L 22  Glucose 70 - 99 mg/dL 782 (H)  BUN 6 - 24 mg/dL 12  Creatinine 9.56 - 2.13 mg/dL 0.86  Calcium 8.7 - 57.8 mg/dL 8.0 (L)  BUN/Creatinine Ratio 9 - 23  17  eGFR >59 mL/min/1.73 100  Alkaline Phosphatase 44 - 121 IU/L 86  Albumin 3.8 - 4.9 g/dL 4.5  AST 0 - 40 IU/L 16  ALT 0 - 32 IU/L 11  Total Protein 6.0 - 8.5 g/dL 7.4  Total Bilirubin 0.0 - 1.2 mg/dL 0.3   Thyroid Ultrasound 10/24/2022   FINDINGS: Sonographic evaluation of patient's palpable area of  concern involving the superolateral aspect of the neck demonstrates a well-defined 1.5 x 1.3 x 0.5 cm hypoechoic nodule. This nodule is separate from the adjacent submandibular gland (image 3). While the nodule demonstrates an oval-shape, it does not definitively contain a hilum to suggest a nodule represents a lymph node.   IMPRESSION: Patient's palpable area of concern involving the superolateral aspect  of the neck corresponds to a well-defined 1.5 cm hypoechoic nodule. Differential considerations are broad and include but are not limited to a cervical lymph node, ectopic thyroid tissue versus a parathyroid adenoma. Clinical correlation (such as acquisition of thyroglobulin level) is advised.   Potential imaging strategies could entail a follow-up soft tissue neck ultrasound in 6 weeks to confirm persistence or resolution. Alternatively, further evaluation with contrast-enhanced neck CT could be performed as indicated.   Old records , labs and images have been reviewed.   ASSESSMENT/PLAN/RECOMMENDATIONS:   Postoperative Hypothyroidism:   -Pathology report benign, consistent with Graves' disease -Patient is clinically euthyroid -- Pt educated extensively on the correct way to take levothyroxine (first thing in the morning with water, 30 minutes before eating or taking other medications). - Pt encouraged to double dose the following day if she were to miss a dose given long half-life of levothyroxine.  -TSH elevated will increase levothyroxine as below  Medication Stop levothyroxine 88 mcg Start levothyroxine 100 mcg daily  2. Cervical Lymphadenopathy  -She had an ultrasound in August -This seems to just be a few millimeters in size on today's exam -Will repeat neck ultrasound in October -Reassurance provided at this time  3. Hypocalcemia   -Patient is symptomatic -Will stop Tums -Patient advised to start calcium carbonate (Caltrate or Os-Cal D) twice daily(1 tablet  with lunch and 1 tablet with supper) -Vitamin D is normal  Follow-up in 6 months  Signed electronically by: Lyndle Herrlich, MD  [facility]  [facility] [address] [place] [place], [place] [address] Phone: [phone] FAX: [phone]   CC: Ivonne Andrew, NP [address] [place] [address] Phone: [phone] Fax: [phone]   Return to Endocrinology clinic as below: Future Appointments  Date Time Provider Department Center  04/17/2023  3:40 PM Ivonne Andrew, NP [facility] None

## 2022-11-29 ENCOUNTER — Other Ambulatory Visit: Payer: Self-pay

## 2022-11-29 LAB — PTH, INTACT AND CALCIUM
Calcium: 7.7 mg/dL — ABNORMAL LOW (ref 8.6–10.4)
PTH: 11 pg/mL — ABNORMAL LOW (ref 16–77)

## 2022-11-29 LAB — T3: T3, Total: 89 ng/dL (ref 76–181)

## 2022-11-29 MED ORDER — LEVOTHYROXINE SODIUM 100 MCG PO TABS
100.0000 ug | ORAL_TABLET | Freq: Every day | ORAL | 3 refills | Status: DC
  Filled 2022-11-29: qty 30, 30d supply, fill #0
  Filled 2022-12-14 – 2022-12-23 (×4): qty 30, 30d supply, fill #1
  Filled 2023-01-05 – 2023-01-25 (×2): qty 30, 30d supply, fill #2
  Filled 2023-02-25: qty 30, 30d supply, fill #3
  Filled 2023-03-28: qty 30, 30d supply, fill #4
  Filled 2023-04-16 – 2023-04-26 (×2): qty 30, 30d supply, fill #5
  Filled 2023-05-12 – 2023-05-25 (×2): qty 30, 30d supply, fill #6
  Filled 2023-06-22: qty 30, 30d supply, fill #7
  Filled 2023-07-20: qty 30, 30d supply, fill #8
  Filled 2023-08-23: qty 30, 30d supply, fill #9
  Filled 2023-09-21: qty 30, 30d supply, fill #10
  Filled 2023-10-23: qty 30, 30d supply, fill #11

## 2022-12-01 ENCOUNTER — Other Ambulatory Visit: Payer: Self-pay

## 2022-12-02 ENCOUNTER — Encounter: Payer: Self-pay | Admitting: Internal Medicine

## 2022-12-02 ENCOUNTER — Telehealth: Payer: Self-pay

## 2022-12-02 NOTE — Telephone Encounter (Signed)
Spoke with patient and she states that she has been taking medication correctly and will increase medication as advise.

## 2022-12-12 ENCOUNTER — Encounter: Payer: Self-pay | Admitting: Nurse Practitioner

## 2022-12-12 ENCOUNTER — Ambulatory Visit
Admission: RE | Admit: 2022-12-12 | Discharge: 2022-12-12 | Disposition: A | Discharge status: Home / Self Care | Payer: 59 | Source: Ambulatory Visit | Attending: Internal Medicine | Admitting: Internal Medicine

## 2022-12-12 ENCOUNTER — Ambulatory Visit (INDEPENDENT_AMBULATORY_CARE_PROVIDER_SITE_OTHER): Payer: 59 | Admitting: Nurse Practitioner

## 2022-12-12 ENCOUNTER — Other Ambulatory Visit: Payer: Self-pay

## 2022-12-12 VITALS — BP 122/72 | HR 54 | Resp 16 | Ht 64.0 in | Wt 155.6 lb

## 2022-12-12 DIAGNOSIS — R202 Paresthesia of skin: Secondary | ICD-10-CM | POA: Diagnosis not present

## 2022-12-12 DIAGNOSIS — Z114 Encounter for screening for human immunodeficiency virus [HIV]: Secondary | ICD-10-CM

## 2022-12-12 DIAGNOSIS — I499 Cardiac arrhythmia, unspecified: Secondary | ICD-10-CM

## 2022-12-12 DIAGNOSIS — R590 Localized enlarged lymph nodes: Secondary | ICD-10-CM

## 2022-12-12 DIAGNOSIS — R200 Anesthesia of skin: Secondary | ICD-10-CM | POA: Diagnosis not present

## 2022-12-12 DIAGNOSIS — R221 Localized swelling, mass and lump, neck: Secondary | ICD-10-CM | POA: Diagnosis not present

## 2022-12-12 DIAGNOSIS — Z1159 Encounter for screening for other viral diseases: Secondary | ICD-10-CM | POA: Diagnosis not present

## 2022-12-12 DIAGNOSIS — Z9089 Acquired absence of other organs: Secondary | ICD-10-CM | POA: Diagnosis not present

## 2022-12-12 DIAGNOSIS — Z2821 Immunization not carried out because of patient refusal: Secondary | ICD-10-CM | POA: Diagnosis not present

## 2022-12-12 NOTE — Progress Notes (Signed)
Subjective   Patient ID: Jennifer Stanton, female    DOB: Dec 20, 1960, 62 y.o.   MRN: [id number]  Chief Complaint  Patient presents with   Numbness    2-3 weeks armpit to elbow     Referring provider: Ivonne Andrew, NP  Jennifer Stanton is a 62 y.o. female with Past Medical History: No date: Abnormal cells of cervix No date: Anemia No date: Anxiety 06/20/2014: Blurry vision 06/20/2014: Cephalalgia No date: COPD (chronic obstructive pulmonary disease) (HCC) 04/2019: Grieving No date: Heart palpitations 06/20/2014: History of seizure No date: Hyperthyroidism 04/2019: Marijuana use No date: Otitis media No date: PONV (postoperative nausea and vomiting) No date: Right ear pain No date: Seizures (HCC) No date: Sinus tachycardia No date: Thyroid disease   HPI  Patient presents today for an acute visit.  She states for the past 3 weeks she has been having intermittent numbness to bilateral biceps.  She states that when this happens she has to stop what she is doing because she cannot hold anything.  She has recently had her thyroid medication adjusted by endocrinology.  We discussed that she does need to speak with her endocrinologist to make sure that this is not associated with the numbness.  We will place a referral for neurology and check labs today.  Patient states that she is not currently having the numbness today.  Upon exam patient's heart rate was irregular and bradycardia was noted.  We will order EKG today.  Patient is due for health maintenance including hepatitis C screening and HIV screening.  Patient refuses flu vaccine today. Denies f/c/s, n/v/d, hemoptysis, PND, leg swelling Denies chest pain or edema     Allergies  Allergen Reactions   Codeine Rash    Immunization History  Administered Date(s) Administered   Influenza,inj,Quad PF,6+ Mos 04/28/2017, 02/13/2018   PFIZER(Purple Top)SARS-COV-2 Vaccination 07/10/2019, 08/01/2019, 05/13/2020   Tdap  01/01/2013, 10/07/2022    Tobacco History: Social History   Tobacco Use  Smoking Status Every Day   Current packs/day: 0.10   Average packs/day: 0.1 packs/day for 48.0 years (4.8 ttl pk-yrs)   Types: Cigarettes  Smokeless Tobacco Never   Ready to quit: Not Answered Counseling given: Not Answered   Outpatient Encounter Medications as of 12/12/2022  Medication Sig   albuterol (VENTOLIN HFA) 108 (90 Base) MCG/ACT inhaler Inhale 2 puffs into the lungs every 4 (four) hours as needed WHEEZING OR SHORTNESS OF BREATH (COUGH, SHORTNESS OF BREATH OR WHEEZING.).   amitriptyline (ELAVIL) 50 MG tablet Take 1 tablet (50 mg total) by mouth at bedtime.   levothyroxine (SYNTHROID) 100 MCG tablet Take 1 tablet (100 mcg total) by mouth daily.   metoprolol tartrate (LOPRESSOR) 25 MG tablet Take 2 tablets (50 mg total) by mouth in the morning AND 1 tablet (25 mg total) every evening.   montelukast (SINGULAIR) 10 MG tablet Take 1 tablet (10 mg total) by mouth at bedtime.   rosuvastatin (CRESTOR) 10 MG tablet Take 1 tablet (10 mg total) by mouth daily.   No facility-administered encounter medications on file as of 12/12/2022.    Review of Systems  Review of Systems  Constitutional: Negative.   HENT: Negative.    Cardiovascular: Negative.   Gastrointestinal: Negative.   Musculoskeletal:        Numbness and tingling to upper ext  Allergic/Immunologic: Negative.   Neurological: Negative.   Psychiatric/Behavioral: Negative.       Objective:   BP 122/72   Pulse (!) 54  Resp 16   Ht 5\' 4"  (1.626 m)   Wt 155 lb 9.6 oz (70.6 kg)   SpO2 98%   BMI 26.71 kg/m   Wt Readings from Last 5 Encounters:  12/12/22 155 lb 9.6 oz (70.6 kg)  11/28/22 155 lb 6.4 oz (70.5 kg)  10/10/22 153 lb (69.4 kg)  04/08/22 141 lb 12.8 oz (64.3 kg)  03/18/22 138 lb 3.7 oz (62.7 kg)     Physical Exam Vitals and nursing note reviewed.  Constitutional:      General: She is not in acute distress.    Appearance:  She is well-developed.  Cardiovascular:     Rate and Rhythm: Normal rate and regular rhythm.  Pulmonary:     Effort: Pulmonary effort is normal.     Breath sounds: Normal breath sounds.  Neurological:     Mental Status: She is alert and oriented to person, place, and time.       Assessment & Plan:   Need for hepatitis C screening test -     HCV Ab w Reflex to Quant PCR  Screening for HIV (human immunodeficiency virus) -     HIV Antibody (routine testing w rflx)  Numbness and tingling of both upper extremities -     CBC -     Comprehensive metabolic panel -     Ambulatory referral to Neurology -     Vitamin B12 -     VITAMIN D 25 Hydroxy (Vit-D Deficiency, Fractures)  Irregular heart rhythm -     EKG 12-Lead  Flu vaccine refused     No follow-ups on file.   Ivonne Andrew, NP 12/12/2022

## 2022-12-12 NOTE — Patient Instructions (Addendum)
1. Need for hepatitis C screening test  - HCV Ab w Reflex to Quant PCR   2. Screening for HIV (human immunodeficiency virus)  - HIV Antibody (routine testing w rflx)   3. Numbness and tingling of both upper extremities  - CBC - Comprehensive metabolic panel - Ambulatory referral to Neurology - Vitamin B12 - Vitamin D, 25-hydroxy   4. Irregular heart rhythm  - EKG 12-Lead    Follow up:  Follow up in 3 months

## 2022-12-13 LAB — COMPREHENSIVE METABOLIC PANEL
ALT: 12 [IU]/L (ref 0–32)
AST: 18 [IU]/L (ref 0–40)
Albumin: 4.4 g/dL (ref 3.8–4.9)
Alkaline Phosphatase: 81 [IU]/L (ref 44–121)
BUN/Creatinine Ratio: 24 — ABNORMAL HIGH (ref 9–23)
BUN: 15 mg/dL (ref 6–24)
Bilirubin Total: 0.3 mg/dL (ref 0.0–1.2)
CO2: 23 mmol/L (ref 20–29)
Calcium: 7.8 mg/dL — ABNORMAL LOW (ref 8.7–10.2)
Chloride: 103 mmol/L (ref 96–106)
Creatinine, Ser: 0.62 mg/dL (ref 0.57–1.00)
Globulin, Total: 3.3 g/dL (ref 1.5–4.5)
Glucose: 99 mg/dL (ref 70–99)
Potassium: 4.4 mmol/L (ref 3.5–5.2)
Sodium: 139 mmol/L (ref 134–144)
Total Protein: 7.7 g/dL (ref 6.0–8.5)
eGFR: 103 mL/min/{1.73_m2} (ref >59)

## 2022-12-13 LAB — CBC
Hematocrit: 42.7 % (ref 34.0–46.6)
Hemoglobin: 14.1 g/dL (ref 11.1–15.9)
MCH: 30.3 pg (ref 26.6–33.0)
MCHC: 33 g/dL (ref 31.5–35.7)
MCV: 92 fL (ref 79–97)
Platelets: 278 10*3/uL (ref 150–450)
RBC: 4.66 x10E6/uL (ref 3.77–5.28)
RDW: 12.9 % (ref 11.7–15.4)
WBC: 5.2 10*3/uL (ref 3.4–10.8)

## 2022-12-13 LAB — HIV ANTIBODY (ROUTINE TESTING W REFLEX): HIV Screen 4th Generation wRfx: NONREACTIVE

## 2022-12-13 LAB — HCV AB W REFLEX TO QUANT PCR: HCV Ab: NONREACTIVE

## 2022-12-13 LAB — VITAMIN D 25 HYDROXY (VIT D DEFICIENCY, FRACTURES): Vit D, 25-Hydroxy: 52.6 ng/mL (ref 30.0–100.0)

## 2022-12-13 LAB — HCV INTERPRETATION

## 2022-12-13 LAB — VITAMIN B12: Vitamin B-12: 598 pg/mL (ref 232–1245)

## 2022-12-15 ENCOUNTER — Encounter: Payer: Self-pay | Admitting: Neurology

## 2022-12-15 ENCOUNTER — Other Ambulatory Visit: Payer: Self-pay

## 2022-12-20 ENCOUNTER — Other Ambulatory Visit: Payer: Self-pay

## 2022-12-20 ENCOUNTER — Emergency Department (HOSPITAL_BASED_OUTPATIENT_CLINIC_OR_DEPARTMENT_OTHER)
Admission: EM | Admit: 2022-12-20 | Discharge: 2022-12-20 | Disposition: A | Discharge status: Home / Self Care | Payer: 59 | Attending: Emergency Medicine | Admitting: Emergency Medicine

## 2022-12-20 ENCOUNTER — Encounter (HOSPITAL_BASED_OUTPATIENT_CLINIC_OR_DEPARTMENT_OTHER): Payer: Self-pay | Admitting: Emergency Medicine

## 2022-12-20 DIAGNOSIS — M5459 Other low back pain: Secondary | ICD-10-CM | POA: Diagnosis not present

## 2022-12-20 DIAGNOSIS — M545 Low back pain, unspecified: Secondary | ICD-10-CM | POA: Insufficient documentation

## 2022-12-20 MED ORDER — METHOCARBAMOL 500 MG PO TABS
500.0000 mg | ORAL_TABLET | Freq: Three times a day (TID) | ORAL | 0 refills | Status: DC | PRN
  Filled 2022-12-20: qty 8, 3d supply, fill #0

## 2022-12-20 MED ORDER — PREDNISONE 20 MG PO TABS
20.0000 mg | ORAL_TABLET | Freq: Every day | ORAL | 0 refills | Status: DC
  Filled 2022-12-20: qty 3, 3d supply, fill #0

## 2022-12-20 MED ORDER — KETOROLAC TROMETHAMINE 15 MG/ML IJ SOLN
15.0000 mg | Freq: Once | INTRAMUSCULAR | Status: AC
  Administered 2022-12-20: 15 mg via INTRAMUSCULAR
  Filled 2022-12-20: qty 1

## 2022-12-20 NOTE — ED Triage Notes (Addendum)
Back pain mid to lower, x "couple days" Sitting makes the pain worse

## 2022-12-20 NOTE — ED Notes (Signed)
Pt given discharge instructions and reviewed prescriptions. Opportunities given for questions. Pt verbalizes understanding. Madi Bonfiglio R, RN 

## 2022-12-20 NOTE — ED Provider Notes (Signed)
[place] EMERGENCY DEPARTMENT AT [facility] Provider Note   CSN: [id number] Arrival date & time: 12/20/22  1447     History  Chief Complaint  Patient presents with   Back Pain    Jennifer Stanton is a 62 y.o. female.   Back Pain Patient presents with low back pain.  Has had for the last couple days.  No injury.  No extra physical activity.  No radiation.  Does not feel cramping.  No radiation down the legs.  No trauma.  No IV drug use.  No cancer history.  Has had issues with hypocalcemia however.     Home Medications Prior to Admission medications   Medication Sig Start Date End Date Taking? Authorizing Provider  methocarbamol (ROBAXIN) 500 MG tablet Take 1 tablet (500 mg total) by mouth every 8 (eight) hours as needed. 12/20/22  Yes Benjiman Core, MD  predniSONE (DELTASONE) 20 MG tablet Take 1 tablet (20 mg total) by mouth daily. 12/20/22  Yes Benjiman Core, MD  albuterol (VENTOLIN HFA) 108 (90 Base) MCG/ACT inhaler Inhale 2 puffs into the lungs every 4 (four) hours as needed WHEEZING OR SHORTNESS OF BREATH (COUGH, SHORTNESS OF BREATH OR WHEEZING.). 10/10/22   Ivonne Andrew, NP  amitriptyline (ELAVIL) 50 MG tablet Take 1 tablet (50 mg total) by mouth at bedtime. 10/10/22 10/10/23  Ivonne Andrew, NP  levothyroxine (SYNTHROID) 100 MCG tablet Take 1 tablet (100 mcg total) by mouth daily. 11/29/22   Shamleffer, Konrad Dolores, MD  metoprolol tartrate (LOPRESSOR) 25 MG tablet Take 2 tablets (50 mg total) by mouth in the morning AND 1 tablet (25 mg total) every evening. 10/10/22   Ivonne Andrew, NP  montelukast (SINGULAIR) 10 MG tablet Take 1 tablet (10 mg total) by mouth at bedtime. 10/10/22   Ivonne Andrew, NP  rosuvastatin (CRESTOR) 10 MG tablet Take 1 tablet (10 mg total) by mouth daily. 10/12/22 10/12/23  Ivonne Andrew, NP      Allergies    Codeine    Review of Systems   Review of Systems  Musculoskeletal:  Positive for back pain.    Physical  Exam Updated Vital Signs BP 121/75 (BP Location: Right Arm)   Pulse (!) 59   Temp 97.9 F (36.6 C) (Oral)   Resp 16   SpO2 99%  Physical Exam Vitals and nursing note reviewed.  Cardiovascular:     Rate and Rhythm: Regular rhythm.  Abdominal:     Tenderness: There is no abdominal tenderness.  Musculoskeletal:        General: Tenderness present.     Comments: Lumbar tenderness.  Particularly paraspinal.  Good strength in lower extremities.  Sensation grossly intact.  Neurological:     Mental Status: She is alert.     ED Results / Procedures / Treatments   Labs (all labs ordered are listed, but only abnormal results are displayed) Labs Reviewed - No data to display  EKG None  Radiology No results found.  Procedures Procedures    Medications Ordered in ED Medications  ketorolac (TORADOL) 15 MG/ML injection 15 mg (has no administration in time range)    ED Course/ Medical Decision Making/ A&P                                 Medical Decision Making Risk Prescription drug management.   Patient with low back pain.  Appears more musculoskeletal.  Reproducible with  palpation.  Doubt causes such as metastatic disease.  Has a history of hypocalcemia and I reviewed recent blood work and notes.  Discussed with potentially rechecking calcium of the do not think it is necessarily related to the back pain.  We will hold off on this for now.  Will treat with steroids muscle relaxers along with a shot of Toradol here.  Social determinants of health is marijuana use.        Final Clinical Impression(s) / ED Diagnoses Final diagnoses:  Acute midline low back pain without sciatica    Rx / DC Orders ED Discharge Orders          Ordered    methocarbamol (ROBAXIN) 500 MG tablet  Every 8 hours PRN        12/20/22 1536    predniSONE (DELTASONE) 20 MG tablet  Daily        12/20/22 1536              Benjiman Core, MD 12/20/22 1540

## 2022-12-21 ENCOUNTER — Other Ambulatory Visit: Payer: Self-pay

## 2022-12-23 ENCOUNTER — Other Ambulatory Visit: Payer: Self-pay

## 2022-12-23 ENCOUNTER — Other Ambulatory Visit: Payer: Self-pay | Admitting: Nurse Practitioner

## 2022-12-23 MED ORDER — KETOROLAC TROMETHAMINE 10 MG PO TABS
10.0000 mg | ORAL_TABLET | Freq: Three times a day (TID) | ORAL | 0 refills | Status: AC | PRN
  Filled 2022-12-23: qty 15, 5d supply, fill #0

## 2022-12-23 NOTE — Progress Notes (Signed)
Toradol  

## 2023-01-05 ENCOUNTER — Other Ambulatory Visit: Payer: Self-pay

## 2023-01-05 ENCOUNTER — Other Ambulatory Visit: Payer: Self-pay | Admitting: Nurse Practitioner

## 2023-01-05 MED ORDER — METOPROLOL TARTRATE 25 MG PO TABS
ORAL_TABLET | ORAL | 0 refills | Status: DC
Start: 2023-01-05 — End: 2023-03-28
  Filled 2023-01-05: qty 90, 30d supply, fill #0
  Filled 2023-01-25 – 2023-02-05 (×2): qty 90, 30d supply, fill #1
  Filled 2023-02-25 – 2023-03-10 (×2): qty 90, 30d supply, fill #2

## 2023-01-09 ENCOUNTER — Other Ambulatory Visit: Payer: Self-pay

## 2023-01-23 ENCOUNTER — Ambulatory Visit: Payer: 59 | Admitting: Neurology

## 2023-01-25 ENCOUNTER — Other Ambulatory Visit: Payer: Self-pay

## 2023-01-31 ENCOUNTER — Other Ambulatory Visit: Payer: Self-pay

## 2023-02-09 ENCOUNTER — Other Ambulatory Visit: Payer: Self-pay

## 2023-02-27 ENCOUNTER — Other Ambulatory Visit: Payer: Self-pay

## 2023-02-28 ENCOUNTER — Other Ambulatory Visit: Payer: Self-pay

## 2023-03-15 ENCOUNTER — Other Ambulatory Visit: Payer: Self-pay

## 2023-03-28 ENCOUNTER — Other Ambulatory Visit: Payer: Self-pay | Admitting: Nurse Practitioner

## 2023-03-29 ENCOUNTER — Other Ambulatory Visit: Payer: Self-pay

## 2023-03-29 MED ORDER — METOPROLOL TARTRATE 25 MG PO TABS
ORAL_TABLET | ORAL | 0 refills | Status: DC
Start: 2023-03-29 — End: 2023-07-10
  Filled 2023-03-29 – 2023-04-15 (×2): qty 90, 30d supply, fill #0
  Filled 2023-05-12: qty 90, 30d supply, fill #1
  Filled 2023-06-14: qty 90, 30d supply, fill #2

## 2023-04-03 ENCOUNTER — Ambulatory Visit: Payer: 59 | Admitting: Internal Medicine

## 2023-04-03 NOTE — Progress Notes (Deleted)
Name: Jennifer Stanton  MRN/ DOB: [id number], 02-19-1961    Age/ Sex: 63 y.o., female    PCP: Ivonne Andrew, NP   Reason for Endocrinology Evaluation: Hyperthyroidism     Date of Initial Endocrinology Evaluation: 01/10/2022     HPI: [name] is a 63 y.o. female with a past medical history of Hyperthyroidism. The patient presented for initial endocrinology clinic visit on 01/10/2022 for consultative assistance with her Hyperthyroidism.   Pt has been diagnosed with hyperthyroidism since 2018. Has been on PTU since 2019.  No prior use of methimazole    Thyroid ultrasound did  NOT show any thyroid nodules in 2019 and 2020.   No FH of thyroid disease    NO prior radiation   She is S/P total thyroidectomy 03/18/2022, postoperative course was complicated by hypocalcemia, she received IV calcium gluconate as well as oral calcium carbonate and calcitriol   She was discharged on levothyroxine  SUBJECTIVE:    Today (04/03/23): [name] is here for a follow up on postoperative hypothyroidism. She is accompanied with her best friend Jennifer Stanton   Patient has been noted with weight gain Has occasional  palpitations , takes Metoprolol  Has diarrhea or loose stools  Has perioral tingling as well as hands and feet with muscle cramps   No Biotin   Levothyroxine 100 mcg daily Caltrate BID       HISTORY:  Past Medical History:  Past Medical History:  Diagnosis Date   Abnormal cells of cervix    Anemia    Anxiety    Blurry vision 06/20/2014   Cephalalgia 06/20/2014   COPD (chronic obstructive pulmonary disease) (HCC)    Grieving 04/2019   Heart palpitations    History of seizure 06/20/2014   Hyperthyroidism    Marijuana use 04/2019   Otitis media    PONV (postoperative nausea and vomiting)    Right ear pain    Seizures (HCC)    Sinus tachycardia    Thyroid disease    Past Surgical History:  Past Surgical History:  Procedure Laterality Date    CERVICAL CONE BIOPSY     INNER EAR SURGERY  at age 24   surgey on thumb  02/29/2020   THYROIDECTOMY N/A 03/18/2022   Procedure: TOTAL THYROIDECTOMY;  Surgeon: Darnell Level, MD;  Location: [facility];  Service: General;  Laterality: N/A;   TUBAL LIGATION  07/05/1991    Social History:  reports that she has been smoking cigarettes. She has a 4.8 pack-year smoking history. She has never used smokeless tobacco. She reports that she does not currently use alcohol. She reports current drug use. Drug: Marijuana. Family History: family history includes Breast cancer (age of onset: 70) in her mother; COPD in her mother; Cancer in her mother; Heart disease in her father; Hemochromatosis in her brother.   HOME MEDICATIONS: Allergies as of 04/03/2023       Reactions   Codeine Rash        Medication List        Accurate as of April 03, 2023  7:43 AM. If you have any questions, ask your nurse or doctor.          albuterol 108 (90 Base) MCG/ACT inhaler Commonly known as: Ventolin HFA Inhale 2 puffs into the lungs every 4 (four) hours as needed WHEEZING OR SHORTNESS OF BREATH (COUGH, SHORTNESS OF BREATH OR WHEEZING.).   amitriptyline 50 MG tablet Commonly known as: ELAVIL Take 1 tablet (50  mg total) by mouth at bedtime.   levothyroxine 100 MCG tablet Commonly known as: SYNTHROID Take 1 tablet (100 mcg total) by mouth daily.   methocarbamol 500 MG tablet Commonly known as: ROBAXIN Take 1 tablet (500 mg total) by mouth every 8 (eight) hours as needed.   metoprolol tartrate 25 MG tablet Commonly known as: LOPRESSOR Take 2 tablets (50 mg total) by mouth in the morning AND 1 tablet (25 mg total) every evening.   montelukast 10 MG tablet Commonly known as: SINGULAIR Take 1 tablet (10 mg total) by mouth at bedtime.   predniSONE 20 MG tablet Commonly known as: DELTASONE Take 1 tablet (20 mg total) by mouth daily.   rosuvastatin 10 MG tablet Commonly known as: Crestor Take 1 tablet (10  mg total) by mouth daily.          REVIEW OF SYSTEMS: A comprehensive ROS was conducted with the patient and is negative except as per HPI     OBJECTIVE:  VS: There were no vitals taken for this visit.   Wt Readings from Last 3 Encounters:  12/12/22 155 lb 9.6 oz (70.6 kg)  11/28/22 155 lb 6.4 oz (70.5 kg)  10/10/22 153 lb (69.4 kg)     EXAM: General: Pt appears well and is in NAD  Neck: General: Supple without adenopathy. Thyroid:  No goiter or nodules appreciated.  Right anterior neck few millimeter lymph node noted, soft and mobile  Lungs: Clear with good BS bilat   Heart: Auscultation: RRR.  Abdomen: Soft, nontender  Extremities:  BL LE: No pretibial edema   Mental Status: Judgment, insight: Intact Orientation: Oriented to time, place, and person Mood and affect: No depression, anxiety, or agitation     DATA REVIEWED:   Latest Reference Range & Units 11/28/22 10:18  Calcium 8.6 - 10.4 mg/dL 7.7 (L) (IP)  Albumin 3.5 - 5.2 g/dL 4.1    Latest Reference Range & Units 11/28/22 10:18  VITD 30.00 - 100.00 ng/mL 32.47    Latest Reference Range & Units 11/28/22 10:18  TSH 0.35 - 5.50 uIU/mL 5.99 (H)  Triiodothyronine (T3) 76 - 181 ng/dL 89  W0,JWJX(BJYNWG) 9.56 - 1.60 ng/dL 2.13  (H): Data is abnormally high   Latest Reference Range & Units 10/10/22 14:38  Sodium 134 - 144 mmol/L 139  Potassium 3.5 - 5.2 mmol/L 4.2  Chloride 96 - 106 mmol/L 101  CO2 20 - 29 mmol/L 22  Glucose 70 - 99 mg/dL 086 (H)  BUN 6 - 24 mg/dL 12  Creatinine 5.78 - 4.69 mg/dL 6.29  Calcium 8.7 - 52.8 mg/dL 8.0 (L)  BUN/Creatinine Ratio 9 - 23  17  eGFR >59 mL/min/1.73 100  Alkaline Phosphatase 44 - 121 IU/L 86  Albumin 3.8 - 4.9 g/dL 4.5  AST 0 - 40 IU/L 16  ALT 0 - 32 IU/L 11  Total Protein 6.0 - 8.5 g/dL 7.4  Total Bilirubin 0.0 - 1.2 mg/dL 0.3   Thyroid Ultrasound 10/24/2022   FINDINGS: Sonographic evaluation of patient's palpable area of concern involving the  superolateral aspect of the neck demonstrates a well-defined 1.5 x 1.3 x 0.5 cm hypoechoic nodule. This nodule is separate from the adjacent submandibular gland (image 3). While the nodule demonstrates an oval-shape, it does not definitively contain a hilum to suggest a nodule represents a lymph node.   IMPRESSION: Patient's palpable area of concern involving the superolateral aspect of the neck corresponds to a well-defined 1.5 cm hypoechoic nodule. Differential considerations are broad  and include but are not limited to a cervical lymph node, ectopic thyroid tissue versus a parathyroid adenoma. Clinical correlation (such as acquisition of thyroglobulin level) is advised.   Potential imaging strategies could entail a follow-up soft tissue neck ultrasound in 6 weeks to confirm persistence or resolution. Alternatively, further evaluation with contrast-enhanced neck CT could be performed as indicated.   Old records , labs and images have been reviewed.   ASSESSMENT/PLAN/RECOMMENDATIONS:   Postoperative Hypothyroidism:   -Pathology report benign, consistent with Graves' disease -Patient is clinically euthyroid -- Pt educated extensively on the correct way to take levothyroxine (first thing in the morning with water, 30 minutes before eating or taking other medications). - Pt encouraged to double dose the following day if she were to miss a dose given long half-life of levothyroxine.  -TSH elevated will increase levothyroxine as below  Medication  levothyroxine 100 mcg daily   2. Hypocalcemia   -Patient is symptomatic -Switched  Tums to Caltrate    Medication  calcium carbonate (Caltrate or Os-Cal D) twice daily(1 tablet with lunch and 1 tablet with supper)   Follow-up in 6 months  Signed electronically by: Lyndle Herrlich, MD  [facility]  [facility] [address] [place] [place], [place] [address] Phone: [phone] FAX:  [phone]   CC: Ivonne Andrew, NP [address] [place] [address] Phone: [phone] Fax: [phone]   Return to Endocrinology clinic as below: Future Appointments  Date Time Provider Department Center  04/03/2023 12:10 PM Sung Renton, Konrad Dolores, MD [facility] None  04/17/2023  3:40 PM Ivonne Andrew, NP [facility] None

## 2023-04-16 ENCOUNTER — Other Ambulatory Visit: Payer: Self-pay

## 2023-04-17 ENCOUNTER — Other Ambulatory Visit: Payer: Self-pay

## 2023-04-17 ENCOUNTER — Ambulatory Visit: Payer: Self-pay | Admitting: Nurse Practitioner

## 2023-04-27 ENCOUNTER — Other Ambulatory Visit: Payer: Self-pay

## 2023-05-12 ENCOUNTER — Other Ambulatory Visit: Payer: Self-pay

## 2023-05-16 ENCOUNTER — Other Ambulatory Visit: Payer: Self-pay

## 2023-05-26 ENCOUNTER — Other Ambulatory Visit: Payer: Self-pay

## 2023-06-08 ENCOUNTER — Other Ambulatory Visit: Payer: Self-pay

## 2023-06-08 ENCOUNTER — Emergency Department (HOSPITAL_BASED_OUTPATIENT_CLINIC_OR_DEPARTMENT_OTHER)
Admission: EM | Admit: 2023-06-08 | Discharge: 2023-06-08 | Disposition: A | Discharge status: Home / Self Care | Payer: Self-pay | Attending: Emergency Medicine | Admitting: Emergency Medicine

## 2023-06-08 ENCOUNTER — Encounter (HOSPITAL_BASED_OUTPATIENT_CLINIC_OR_DEPARTMENT_OTHER): Payer: Self-pay | Admitting: Emergency Medicine

## 2023-06-08 DIAGNOSIS — Z79899 Other long term (current) drug therapy: Secondary | ICD-10-CM | POA: Insufficient documentation

## 2023-06-08 DIAGNOSIS — M25561 Pain in right knee: Secondary | ICD-10-CM | POA: Insufficient documentation

## 2023-06-08 DIAGNOSIS — M545 Low back pain, unspecified: Secondary | ICD-10-CM | POA: Insufficient documentation

## 2023-06-08 MED ORDER — KETOROLAC TROMETHAMINE 15 MG/ML IJ SOLN
15.0000 mg | Freq: Once | INTRAMUSCULAR | Status: AC
  Administered 2023-06-08: 15 mg via INTRAMUSCULAR
  Filled 2023-06-08: qty 1

## 2023-06-08 MED ORDER — METHOCARBAMOL 500 MG PO TABS
500.0000 mg | ORAL_TABLET | Freq: Three times a day (TID) | ORAL | 0 refills | Status: DC | PRN
  Filled 2023-06-08: qty 8, 3d supply, fill #0

## 2023-06-08 MED ORDER — METHOCARBAMOL 500 MG PO TABS
500.0000 mg | ORAL_TABLET | Freq: Three times a day (TID) | ORAL | 0 refills | Status: DC | PRN
Start: 2023-06-08 — End: 2023-06-22
  Filled 2023-06-08: qty 30, 10d supply, fill #0

## 2023-06-08 NOTE — ED Triage Notes (Signed)
 C/o chronic lower back pain. Sent from PCP to "get a Toradol shot"

## 2023-06-08 NOTE — Discharge Instructions (Signed)
 Please read and follow all provided instructions.  Your diagnoses today include:  1. Acute midline low back pain without sciatica   2. Acute pain of right knee     Tests performed today include: Vital signs - see below for your results today  Medications prescribed:  Robaxin (methocarbamol) - muscle relaxer medication  DO NOT drive or perform any activities that require you to be awake and alert because this medicine can make you drowsy.   Take any prescribed medications only as directed.  Home care instructions:  Follow any educational materials contained in this packet Please rest, use ice or heat on your back for the next several days Do not lift, push, pull anything more than 10 pounds for the next week  Follow-up instructions: Please follow-up with your primary care provider in the next 1 week for further evaluation of your symptoms.   Return instructions:  SEEK IMMEDIATE MEDICAL ATTENTION IF YOU HAVE: New numbness, tingling, weakness, or problem with the use of your arms or legs Severe back pain not relieved with medications Loss control of your bowels or bladder Increasing pain in any areas of the body (such as chest or abdominal pain) Shortness of breath, dizziness, or fainting.  Worsening nausea (feeling sick to your stomach), vomiting, fever, or sweats Any other emergent concerns regarding your health   Additional Information:  Your vital signs today were: BP 122/89 (BP Location: Right Arm)   Pulse 66   Temp 98.2 F (36.8 C)   Resp 16   SpO2 99%  If your blood pressure (BP) was elevated above 135/85 this visit, please have this repeated by your doctor within one month. --------------

## 2023-06-08 NOTE — ED Provider Notes (Signed)
 [facility] EMERGENCY DEPARTMENT AT [facility] Provider Note   CSN: [id number] Arrival date & time: 06/08/23  1513     History  Chief Complaint  Patient presents with   Back Pain    Jennifer Stanton is a 63 y.o. female.  Patient presents emergency department for evaluation of low back pain ongoing over the past couple of days.  Patient states that several days ago her right knee began to hurt.  She denies falls or acute injuries.  The pain was aching.  She thinks that she was favoring her leg and this caused her back pain to become exacerbated.  She reports being seen in the emergency department about 6 months ago for similar pain.  She received a shot of Toradol which helped her.  She was prescribed Robaxin for home as well. Patient denies warning symptoms of back pain including: fecal incontinence, urinary retention or overflow incontinence, night sweats, waking from sleep with back pain, unexplained fevers or weight loss, h/o cancer, recent trauma.  She states that she spoke with her PCP today and could not obtain an appointment until next week.  She decided to come in for evaluation.       Home Medications Prior to Admission medications   Medication Sig Start Date End Date Taking? Authorizing Provider  albuterol (VENTOLIN HFA) 108 (90 Base) MCG/ACT inhaler Inhale 2 puffs into the lungs every 4 (four) hours as needed WHEEZING OR SHORTNESS OF BREATH (COUGH, SHORTNESS OF BREATH OR WHEEZING.). 10/10/22   Ivonne Andrew, NP  amitriptyline (ELAVIL) 50 MG tablet Take 1 tablet (50 mg total) by mouth at bedtime. 10/10/22 10/10/23  Ivonne Andrew, NP  levothyroxine (SYNTHROID) 100 MCG tablet Take 1 tablet (100 mcg total) by mouth daily. 11/29/22   Shamleffer, Konrad Dolores, MD  methocarbamol (ROBAXIN) 500 MG tablet Take 1 tablet (500 mg total) by mouth every 8 (eight) hours as needed. 06/08/23   Renne Crigler, PA-C  metoprolol tartrate (LOPRESSOR) 25 MG tablet Take 2 tablets (50 mg  total) by mouth in the morning AND 1 tablet (25 mg total) every evening. 03/29/23   Ivonne Andrew, NP  montelukast (SINGULAIR) 10 MG tablet Take 1 tablet (10 mg total) by mouth at bedtime. 10/10/22   Ivonne Andrew, NP  predniSONE (DELTASONE) 20 MG tablet Take 1 tablet (20 mg total) by mouth daily. 12/20/22   Benjiman Core, MD  rosuvastatin (CRESTOR) 10 MG tablet Take 1 tablet (10 mg total) by mouth daily. 10/12/22 10/12/23  Ivonne Andrew, NP      Allergies    Codeine    Review of Systems   Review of Systems  Physical Exam Updated Vital Signs BP 122/89 (BP Location: Right Arm)   Pulse 66   Temp 98.2 F (36.8 C)   Resp 16   SpO2 99%   Physical Exam Vitals and nursing note reviewed.  Constitutional:      Appearance: She is well-developed.  HENT:     Head: Normocephalic and atraumatic.  Eyes:     Conjunctiva/sclera: Conjunctivae normal.  Pulmonary:     Effort: Pulmonary effort is normal.  Abdominal:     Palpations: Abdomen is soft.     Tenderness: There is no abdominal tenderness.  Musculoskeletal:        General: Normal range of motion.     Cervical back: Normal range of motion and neck supple.     Lumbar back: Tenderness present. No swelling or bony tenderness.  Back:     Right knee: No effusion or bony tenderness. Normal range of motion. Tenderness (Mild, superior patella) present.       Legs:     Comments: No step-off noted with palpation of spine.     Skin:    General: Skin is warm and dry.     Findings: No rash.  Neurological:     Mental Status: She is alert.     Sensory: No sensory deficit.     Comments: 5/5 strength in entire lower extremities bilaterally. No sensation deficit.      ED Results / Procedures / Treatments   Labs (all labs ordered are listed, but only abnormal results are displayed) Labs Reviewed - No data to display  EKG None  Radiology No results found.  Procedures Procedures    Medications Ordered in ED Medications   ketorolac (TORADOL) 15 MG/ML injection 15 mg (has no administration in time range)    ED Course/ Medical Decision Making/ A&P    Patient seen and examined. History obtained directly from patient.    Labs/EKG: None ordered.  Imaging: None ordered. Considering imaging of the lumbar spine with x-ray or CT but no red flags today and feel this would be low yield.   Medications/Fluids: Ordered: IM Toradol  Most recent vital signs reviewed and are as follows: BP 122/89 (BP Location: Right Arm)   Pulse 66   Temp 98.2 F (36.8 C)   Resp 16   SpO2 99%   Initial impression: Low back pain without radicular features, knee pain  Home treatment plan: Patient was counseled on back pain precautions and advised to do activity as tolerated but avoid strenuous activity and do not lift, push, or pull heavy objects more than 10 pounds for the next week.  Patient counseled to use ice or heat on back as needed for pain and spasm.    Medications prescribed: Robaxin for muscle pain and spasm. Patient counseled on proper use of muscle relaxant medication.  They were requested not to drink alcohol, drive any vehicle, or do any dangerous activities while taking this medication due to potential drowsiness and unintended.  Patient verbalized understanding.  Return instructions discussed with patient: Urged to return with worsening severe pain, loss of bowel or bladder control, trouble walking, development of weakness in the legs, or with any other concerns.  Follow-up instructions discussed with patient: Patient urged to follow-up with PCP if pain does not improve with treatment and rest or if pain becomes recurrent.                                 Medical Decision Making Risk Prescription drug management.   Patient with back pain without radicular features. No neurological deficits. Patient is ambulatory. No warning symptoms of back pain including: fecal incontinence, urinary retention or overflow  incontinence, night sweats, waking from sleep with back pain, unexplained fevers or weight loss, h/o cancer, recent trauma. No concern for cauda equina, epidural abscess, or other serious cause of back pain. Conservative measures such as rest, ice/heat and pain medicine indicated with PCP follow-up if no improvement with conservative management.          Final Clinical Impression(s) / ED Diagnoses Final diagnoses:  Acute midline low back pain without sciatica  Acute pain of right knee    Rx / DC Orders ED Discharge Orders          Ordered  methocarbamol (ROBAXIN) 500 MG tablet  Every 8 hours PRN,   Status:  Discontinued        06/08/23 1851    methocarbamol (ROBAXIN) 500 MG tablet  Every 8 hours PRN        06/08/23 1851              Renne Crigler, PA-C 06/08/23 1855    Virgina Norfolk, DO 06/08/23 2247

## 2023-06-09 ENCOUNTER — Other Ambulatory Visit: Payer: Self-pay

## 2023-06-14 ENCOUNTER — Other Ambulatory Visit: Payer: Self-pay

## 2023-06-22 ENCOUNTER — Other Ambulatory Visit: Payer: Self-pay

## 2023-06-22 ENCOUNTER — Other Ambulatory Visit: Payer: Self-pay | Admitting: Nurse Practitioner

## 2023-06-22 MED ORDER — METHOCARBAMOL 500 MG PO TABS
500.0000 mg | ORAL_TABLET | Freq: Three times a day (TID) | ORAL | 0 refills | Status: DC | PRN
  Filled 2023-06-22: qty 30, 10d supply, fill #0

## 2023-06-27 ENCOUNTER — Other Ambulatory Visit: Payer: Self-pay

## 2023-07-10 ENCOUNTER — Other Ambulatory Visit: Payer: Self-pay

## 2023-07-10 ENCOUNTER — Other Ambulatory Visit: Payer: Self-pay | Admitting: Nurse Practitioner

## 2023-07-10 MED ORDER — METOPROLOL TARTRATE 25 MG PO TABS
ORAL_TABLET | ORAL | 0 refills | Status: DC
  Filled 2023-07-10: qty 270, 90d supply, fill #0

## 2023-07-12 ENCOUNTER — Other Ambulatory Visit: Payer: Self-pay

## 2023-07-20 ENCOUNTER — Other Ambulatory Visit: Payer: Self-pay

## 2023-07-24 ENCOUNTER — Ambulatory Visit: Admitting: Nurse Practitioner

## 2023-07-27 ENCOUNTER — Other Ambulatory Visit: Payer: Self-pay

## 2023-08-07 ENCOUNTER — Encounter: Payer: Self-pay | Admitting: Nurse Practitioner

## 2023-08-07 ENCOUNTER — Ambulatory Visit (INDEPENDENT_AMBULATORY_CARE_PROVIDER_SITE_OTHER): Payer: Self-pay | Admitting: Nurse Practitioner

## 2023-08-07 ENCOUNTER — Other Ambulatory Visit: Payer: Self-pay

## 2023-08-07 VITALS — BP 122/69 | HR 55 | Temp 98.4°F | Wt 156.4 lb

## 2023-08-07 DIAGNOSIS — R252 Cramp and spasm: Secondary | ICD-10-CM

## 2023-08-07 DIAGNOSIS — E0590 Thyrotoxicosis, unspecified without thyrotoxic crisis or storm: Secondary | ICD-10-CM

## 2023-08-07 DIAGNOSIS — L02415 Cutaneous abscess of right lower limb: Secondary | ICD-10-CM

## 2023-08-07 MED ORDER — CEPHALEXIN 500 MG PO CAPS
500.0000 mg | ORAL_CAPSULE | Freq: Three times a day (TID) | ORAL | 0 refills | Status: AC
Start: 2023-08-07 — End: ?
  Filled 2023-08-07: qty 30, 10d supply, fill #0

## 2023-08-07 MED ORDER — CYCLOBENZAPRINE HCL 10 MG PO TABS
10.0000 mg | ORAL_TABLET | Freq: Three times a day (TID) | ORAL | 0 refills | Status: DC | PRN
Start: 2023-08-07 — End: 2023-08-15
  Filled 2023-08-07: qty 30, 10d supply, fill #0

## 2023-08-07 NOTE — Progress Notes (Signed)
 Subjective   Patient ID: Jennifer Stanton, female    DOB: 1960/03/28, 63 y.o.   MRN: [id number]  Chief Complaint  Patient presents with   Mass    Patient stated that has a lump in her neck, body in aching due to low calcium .      Referring provider: Jerrlyn Morel, NP  Jennifer Stanton is a 63 y.o. female with Past Medical History: No date: Abnormal cells of cervix No date: Anemia No date: Anxiety 06/20/2014: Blurry vision 06/20/2014: Cephalalgia No date: COPD (chronic obstructive pulmonary disease) (HCC) 04/2019: Grieving No date: Heart palpitations 06/20/2014: History of seizure No date: Hyperthyroidism 04/2019: Marijuana use No date: Otitis media No date: PONV (postoperative nausea and vomiting) No date: Right ear pain No date: Seizures (HCC) No date: Sinus tachycardia No date: Thyroid  disease   HPI  Patient presents today for a follow-up visit.  She is followed by endocrinology.  She is still concerned about a lump to her neck.  She has had imaging twice on this.  Endocrinology has imaged her neck and has followed with her on this.  She is also concerned with low calcium  which is related to her thyroid .  We discussed that she does need to follow-up with endocrinology to discuss these issues.  Patient is also having months of cramps and is requesting Flexeril .  We discussed that we can trial this.  She is also complaining this morning of a cutaneous abscess to her right inner thigh.  We will order Keflex . Denies f/c/s, n/v/d, hemoptysis, PND, leg swelling. Denies chest pain or edema.       Allergies  Allergen Reactions   Codeine Rash    Immunization History  Administered Date(s) Administered   Influenza,inj,Quad PF,6+ Mos 04/28/2017, 02/13/2018   PFIZER(Purple Top)SARS-COV-2 Vaccination 07/10/2019, 08/01/2019, 05/13/2020   Tdap 01/01/2013, 10/07/2022    Tobacco History: Social History   Tobacco Use  Smoking Status Every Day   Current packs/day:  0.10   Average packs/day: 0.1 packs/day for 48.0 years (4.8 ttl pk-yrs)   Types: Cigarettes  Smokeless Tobacco Never   Ready to quit: No Counseling given: Yes   Outpatient Encounter Medications as of 08/07/2023  Medication Sig   albuterol  (VENTOLIN  HFA) 108 (90 Base) MCG/ACT inhaler Inhale 2 puffs into the lungs every 4 (four) hours as needed WHEEZING OR SHORTNESS OF BREATH (COUGH, SHORTNESS OF BREATH OR WHEEZING.).   amitriptyline  (ELAVIL ) 50 MG tablet Take 1 tablet (50 mg total) by mouth at bedtime.   cephALEXin  (KEFLEX ) 500 MG capsule Take 1 capsule (500 mg total) by mouth 3 (three) times daily.   cyclobenzaprine  (FLEXERIL ) 10 MG tablet Take 1 tablet (10 mg total) by mouth 3 (three) times daily as needed for muscle spasms.   levothyroxine  (SYNTHROID ) 100 MCG tablet Take 1 tablet (100 mcg total) by mouth daily.   methocarbamol  (ROBAXIN ) 500 MG tablet Take 1 tablet (500 mg total) by mouth every 8 (eight) hours as needed.   metoprolol  tartrate (LOPRESSOR ) 25 MG tablet Take 2 tablets (50 mg total) by mouth in the morning AND 1 tablet (25 mg total) every evening.   montelukast  (SINGULAIR ) 10 MG tablet Take 1 tablet (10 mg total) by mouth at bedtime.   rosuvastatin  (CRESTOR ) 10 MG tablet Take 1 tablet (10 mg total) by mouth daily.   predniSONE  (DELTASONE ) 20 MG tablet Take 1 tablet (20 mg total) by mouth daily. (Patient not taking: Reported on 08/07/2023)   No facility-administered encounter medications on file as  of 08/07/2023.    Review of Systems  Review of Systems  Constitutional: Negative.   HENT: Negative.    Cardiovascular: Negative.   Gastrointestinal: Negative.   Allergic/Immunologic: Negative.   Neurological: Negative.   Psychiatric/Behavioral: Negative.       Objective:   BP 122/69   Pulse (!) 55   Temp 98.4 F (36.9 C) (Oral)   Wt 156 lb 6.4 oz (70.9 kg)   SpO2 98%   BMI 26.85 kg/m   Wt Readings from Last 5 Encounters:  08/07/23 156 lb 6.4 oz (70.9 kg)  12/12/22  155 lb 9.6 oz (70.6 kg)  11/28/22 155 lb 6.4 oz (70.5 kg)  10/10/22 153 lb (69.4 kg)  04/08/22 141 lb 12.8 oz (64.3 kg)     Physical Exam Vitals and nursing note reviewed.  Constitutional:      General: She is not in acute distress.    Appearance: She is well-developed.  Cardiovascular:     Rate and Rhythm: Normal rate and regular rhythm.  Pulmonary:     Effort: Pulmonary effort is normal.     Breath sounds: Normal breath sounds.  Neurological:     Mental Status: She is alert and oriented to person, place, and time.       Assessment & Plan:   Hyperthyroidism -     CBC -     Comprehensive metabolic panel with GFR  Muscle cramps -     Cyclobenzaprine  HCl; Take 1 tablet (10 mg total) by mouth 3 (three) times daily as needed for muscle spasms.  Dispense: 30 tablet; Refill: 0  Cutaneous abscess of right lower extremity -     Cephalexin ; Take 1 capsule (500 mg total) by mouth 3 (three) times daily.  Dispense: 30 capsule; Refill: 0     Return in about 6 months (around 02/06/2024).   Jerrlyn Morel, NP 08/07/2023

## 2023-08-07 NOTE — Patient Instructions (Signed)
 1. Hyperthyroidism (Primary)  - CBC - Comprehensive metabolic panel with GFR  2. Muscle cramps  - cyclobenzaprine  (FLEXERIL ) 10 MG tablet; Take 1 tablet (10 mg total) by mouth 3 (three) times daily as needed for muscle spasms.  Dispense: 30 tablet; Refill: 0  3. Cutaneous abscess of right lower extremity  - cephALEXin  (KEFLEX ) 500 MG capsule; Take 1 capsule (500 mg total) by mouth 3 (three) times daily.  Dispense: 30 capsule; Refill: 0

## 2023-08-08 ENCOUNTER — Other Ambulatory Visit: Payer: Self-pay

## 2023-08-08 LAB — CBC
Hematocrit: 44.4 % (ref 34.0–46.6)
Hemoglobin: 14.3 g/dL (ref 11.1–15.9)
MCH: 29.6 pg (ref 26.6–33.0)
MCHC: 32.2 g/dL (ref 31.5–35.7)
MCV: 92 fL (ref 79–97)
Platelets: 272 10*3/uL (ref 150–450)
RBC: 4.83 x10E6/uL (ref 3.77–5.28)
RDW: 13 % (ref 11.7–15.4)
WBC: 6.3 10*3/uL (ref 3.4–10.8)

## 2023-08-08 LAB — COMPREHENSIVE METABOLIC PANEL WITH GFR
ALT: 11 IU/L (ref 0–32)
AST: 16 IU/L (ref 0–40)
Albumin: 4.4 g/dL (ref 3.8–4.9)
Alkaline Phosphatase: 77 IU/L (ref 44–121)
BUN/Creatinine Ratio: 17 (ref 12–28)
BUN: 11 mg/dL (ref 8–27)
Bilirubin Total: 0.4 mg/dL (ref 0.0–1.2)
CO2: 22 mmol/L (ref 20–29)
Calcium: 7.8 mg/dL — ABNORMAL LOW (ref 8.7–10.3)
Chloride: 103 mmol/L (ref 96–106)
Creatinine, Ser: 0.65 mg/dL (ref 0.57–1.00)
Globulin, Total: 2.7 g/dL (ref 1.5–4.5)
Glucose: 97 mg/dL (ref 70–99)
Potassium: 5.1 mmol/L (ref 3.5–5.2)
Sodium: 140 mmol/L (ref 134–144)
Total Protein: 7.1 g/dL (ref 6.0–8.5)
eGFR: 101 mL/min/{1.73_m2} (ref >59)

## 2023-08-09 ENCOUNTER — Ambulatory Visit: Payer: Self-pay | Admitting: Nurse Practitioner

## 2023-08-14 ENCOUNTER — Other Ambulatory Visit: Payer: Self-pay

## 2023-08-15 ENCOUNTER — Other Ambulatory Visit: Payer: Self-pay | Admitting: Nurse Practitioner

## 2023-08-15 ENCOUNTER — Other Ambulatory Visit: Payer: Self-pay

## 2023-08-15 DIAGNOSIS — R252 Cramp and spasm: Secondary | ICD-10-CM

## 2023-08-15 MED ORDER — CYCLOBENZAPRINE HCL 10 MG PO TABS
10.0000 mg | ORAL_TABLET | Freq: Three times a day (TID) | ORAL | 0 refills | Status: DC | PRN
  Filled 2023-08-15 – 2023-08-17 (×2): qty 30, 10d supply, fill #0

## 2023-08-16 ENCOUNTER — Other Ambulatory Visit: Payer: Self-pay

## 2023-08-17 ENCOUNTER — Other Ambulatory Visit: Payer: Self-pay

## 2023-08-23 ENCOUNTER — Other Ambulatory Visit: Payer: Self-pay

## 2023-08-25 ENCOUNTER — Other Ambulatory Visit: Payer: Self-pay

## 2023-09-01 ENCOUNTER — Other Ambulatory Visit: Payer: Self-pay

## 2023-09-04 ENCOUNTER — Other Ambulatory Visit: Payer: Self-pay

## 2023-09-13 ENCOUNTER — Other Ambulatory Visit: Payer: Self-pay

## 2023-09-16 ENCOUNTER — Emergency Department (HOSPITAL_COMMUNITY)

## 2023-09-16 ENCOUNTER — Emergency Department (HOSPITAL_COMMUNITY)
Admission: EM | Admit: 2023-09-16 | Discharge: 2023-09-16 | Disposition: A | Discharge status: Home / Self Care | Attending: Emergency Medicine | Admitting: Emergency Medicine

## 2023-09-16 ENCOUNTER — Encounter (HOSPITAL_COMMUNITY): Payer: Self-pay | Admitting: Emergency Medicine

## 2023-09-16 ENCOUNTER — Other Ambulatory Visit: Payer: Self-pay

## 2023-09-16 DIAGNOSIS — R519 Headache, unspecified: Secondary | ICD-10-CM | POA: Insufficient documentation

## 2023-09-16 DIAGNOSIS — M25572 Pain in left ankle and joints of left foot: Secondary | ICD-10-CM | POA: Diagnosis not present

## 2023-09-16 DIAGNOSIS — N289 Disorder of kidney and ureter, unspecified: Secondary | ICD-10-CM | POA: Insufficient documentation

## 2023-09-16 DIAGNOSIS — Y92410 Unspecified street and highway as the place of occurrence of the external cause: Secondary | ICD-10-CM | POA: Diagnosis not present

## 2023-09-16 DIAGNOSIS — M545 Low back pain, unspecified: Secondary | ICD-10-CM | POA: Diagnosis present

## 2023-09-16 DIAGNOSIS — M25562 Pain in left knee: Secondary | ICD-10-CM | POA: Insufficient documentation

## 2023-09-16 LAB — PROTIME-INR
INR: 1.1 (ref 0.8–1.2)
Prothrombin Time: 14.5 s (ref 11.4–15.2)

## 2023-09-16 LAB — I-STAT CG4 LACTIC ACID, ED: Lactic Acid, Venous: 0.7 mmol/L (ref 0.5–1.9)

## 2023-09-16 LAB — COMPREHENSIVE METABOLIC PANEL WITH GFR
ALT: 15 U/L (ref 0–44)
AST: 20 U/L (ref 15–41)
Albumin: 3.8 g/dL (ref 3.5–5.0)
Alkaline Phosphatase: 57 U/L (ref 38–126)
Anion gap: 15 (ref 5–15)
BUN: 9 mg/dL (ref 6–20)
CO2: 21 mmol/L — ABNORMAL LOW (ref 22–32)
Calcium: 7 mg/dL — ABNORMAL LOW (ref 8.9–10.3)
Chloride: 100 mmol/L (ref 98–111)
Creatinine, Ser: 0.58 mg/dL (ref 0.44–1.00)
GFR, Estimated: >60 mL/min (ref >60)
Glucose, Bld: 104 mg/dL — ABNORMAL HIGH (ref 70–99)
Potassium: 3.4 mmol/L — ABNORMAL LOW (ref 3.5–5.1)
Sodium: 136 mmol/L (ref 135–145)
Total Bilirubin: 0.7 mg/dL (ref 0.0–1.2)
Total Protein: 6.9 g/dL (ref 6.5–8.1)

## 2023-09-16 LAB — CBC
HCT: 41.7 % (ref 36.0–46.0)
Hemoglobin: 14.1 g/dL (ref 12.0–15.0)
MCH: 29.6 pg (ref 26.0–34.0)
MCHC: 33.8 g/dL (ref 30.0–36.0)
MCV: 87.6 fL (ref 80.0–100.0)
Platelets: 269 K/uL (ref 150–400)
RBC: 4.76 MIL/uL (ref 3.87–5.11)
RDW: 12.5 % (ref 11.5–15.5)
WBC: 7.5 K/uL (ref 4.0–10.5)
nRBC: 0 % (ref 0.0–0.2)

## 2023-09-16 LAB — ETHANOL: Alcohol, Ethyl (B): <15 mg/dL (ref <15)

## 2023-09-16 MED ORDER — IOHEXOL 350 MG/ML SOLN
75.0000 mL | Freq: Once | INTRAVENOUS | Status: AC | PRN
Start: 2023-09-16 — End: 2023-09-16
  Administered 2023-09-16: 75 mL via INTRAVENOUS

## 2023-09-16 MED ORDER — FENTANYL CITRATE PF 50 MCG/ML IJ SOSY
50.0000 ug | PREFILLED_SYRINGE | Freq: Once | INTRAMUSCULAR | Status: AC
  Administered 2023-09-16: 50 ug via INTRAVENOUS
  Filled 2023-09-16: qty 1

## 2023-09-16 MED ORDER — HYDROMORPHONE HCL 1 MG/ML IJ SOLN
1.0000 mg | Freq: Once | INTRAMUSCULAR | Status: AC
  Administered 2023-09-16: 1 mg via INTRAVENOUS
  Filled 2023-09-16: qty 1

## 2023-09-16 MED ORDER — HYDROCODONE-ACETAMINOPHEN 5-325 MG PO TABS: 1.0000 | ORAL_TABLET | Freq: Four times a day (QID) | ORAL | 0 refills | Status: DC | PRN
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
## 2023-09-16 NOTE — ED Notes (Signed)
 Patient transported to CT

## 2023-09-19 ENCOUNTER — Telehealth: Payer: Self-pay

## 2023-09-19 NOTE — Transitions of Care (Post Inpatient/ED Visit) (Signed)
   09/19/2023  Name: Stephnie Parlier MRN: [id number] DOB: [birth year]  Today's TOC FU Call Status:   Patient's Name and Date of Birth confirmed.  Transition Care Management Follow-up Telephone Call Date of Discharge: 09/16/23 Discharge Facility: [facility]) Type of Discharge: Emergency Department Reason for ED Visit: Other: How have you been since you were released from the hospital?: Better Any questions or concerns?: No  Items Reviewed: Did you receive and understand the discharge instructions provided?: Yes Medications obtained,verified, and reconciled?: Yes (Medications Reviewed) Any new allergies since your discharge?: No Dietary orders reviewed?: NA Do you have support at home?: Yes Name of Support/Comfort Primary Source: Best friend  Medications Reviewed Today: Medications Reviewed Today     Reviewed by Starlene Charlynn BIRCH, CMA (Certified Medical Assistant) on 09/19/23 at 1129  Med List Status: <None>   Medication Order Taking? Sig Documenting Provider Last Dose Status Informant  albuterol  (VENTOLIN  HFA) 108 (90 Base) MCG/ACT inhaler [id number] Yes Inhale 2 puffs into the lungs every 4 (four) hours as needed WHEEZING OR SHORTNESS OF BREATH (COUGH, SHORTNESS OF BREATH OR WHEEZING.). Oley Bascom RAMAN, NP  Active   amitriptyline  (ELAVIL ) 50 MG tablet [id number] Yes Take 1 tablet (50 mg total) by mouth at bedtime. Oley Bascom RAMAN, NP  Active   cephALEXin  (KEFLEX ) 500 MG capsule [id number] Yes Take 1 capsule (500 mg total) by mouth 3 (three) times daily. Oley Bascom RAMAN, NP  Active   cyclobenzaprine  (FLEXERIL ) 10 MG tablet [id number] Yes Take 1 tablet (10 mg total) by mouth 3 (three) times daily as needed for muscle spasms. Paseda, Folashade R, FNP  Active   HYDROcodone -acetaminophen  (NORCO/VICODIN) 5-325 MG tablet [id number] Yes Take 1 tablet by mouth every 6 (six) hours as needed for severe pain (pain score 7-10). Garrick Charleston, MD  Active   levothyroxine  (SYNTHROID ) 100 MCG  tablet [id number] Yes Take 1 tablet (100 mcg total) by mouth daily. Shamleffer, Ibtehal Jaralla, MD  Active   metoprolol  tartrate (LOPRESSOR ) 25 MG tablet [id number] Yes Take 2 tablets (50 mg total) by mouth in the morning AND 1 tablet (25 mg total) every evening. Oley Bascom RAMAN, NP  Active   montelukast  (SINGULAIR ) 10 MG tablet [id number] Yes Take 1 tablet (10 mg total) by mouth at bedtime. Oley Bascom RAMAN, NP  Active   predniSONE  (DELTASONE ) 20 MG tablet [id number]  Take 1 tablet (20 mg total) by mouth daily.  Patient not taking: Reported on 08/07/2023   Patsey Lot, MD  Active   rosuvastatin  (CRESTOR ) 10 MG tablet [id number] Yes Take 1 tablet (10 mg total) by mouth daily. Oley Bascom RAMAN, NP  Active             Home Care and Equipment/Supplies: Were Home Health Services Ordered?: NA Any new equipment or medical supplies ordered?: NA  Functional Questionnaire: Do you need assistance with bathing/showering or dressing?: No Do you need assistance with meal preparation?: No Do you need assistance with eating?: No Do you have difficulty maintaining continence: No Do you need assistance with getting out of bed/getting out of a chair/moving?: No Do you have difficulty managing or taking your medications?: No  Follow up appointments reviewed: Specialist Hospital Follow-up appointment confirmed?: NA Do you need transportation to your follow-up appointment?: No Do you understand care options if your condition(s) worsen?: Yes-patient verbalized understanding    SIGNATURE Clive Parcel, RMA

## 2023-09-21 ENCOUNTER — Other Ambulatory Visit: Payer: Self-pay

## 2023-09-29 ENCOUNTER — Other Ambulatory Visit: Payer: Self-pay | Admitting: Nurse Practitioner

## 2023-09-29 ENCOUNTER — Other Ambulatory Visit: Payer: Self-pay

## 2023-09-29 DIAGNOSIS — R252 Cramp and spasm: Secondary | ICD-10-CM

## 2023-09-29 MED ORDER — CYCLOBENZAPRINE HCL 10 MG PO TABS
10.0000 mg | ORAL_TABLET | Freq: Three times a day (TID) | ORAL | 0 refills | Status: DC | PRN
  Filled 2023-09-29: qty 30, 10d supply, fill #0

## 2023-09-29 MED ORDER — ROSUVASTATIN CALCIUM 10 MG PO TABS
10.0000 mg | ORAL_TABLET | Freq: Every day | ORAL | 11 refills | Status: AC
  Filled 2023-09-29 – 2023-10-06 (×3): qty 30, 30d supply, fill #0
  Filled 2023-10-30: qty 30, 30d supply, fill #1
  Filled 2023-12-07 – 2023-12-20 (×2): qty 30, 30d supply, fill #2
  Filled 2024-01-15: qty 30, 30d supply, fill #3
  Filled 2024-02-14 – 2024-02-25 (×2): qty 30, 30d supply, fill #4
  Filled 2024-03-22: qty 30, 30d supply, fill #5

## 2023-10-02 ENCOUNTER — Inpatient Hospital Stay: Payer: Self-pay | Admitting: Nurse Practitioner

## 2023-10-06 ENCOUNTER — Other Ambulatory Visit: Payer: Self-pay

## 2023-10-09 ENCOUNTER — Other Ambulatory Visit: Payer: Self-pay

## 2023-10-09 ENCOUNTER — Inpatient Hospital Stay: Admitting: Nurse Practitioner

## 2023-10-09 ENCOUNTER — Other Ambulatory Visit: Payer: Self-pay | Admitting: Nurse Practitioner

## 2023-10-09 MED ORDER — METOPROLOL TARTRATE 25 MG PO TABS
ORAL_TABLET | ORAL | 0 refills | Status: DC
  Filled 2023-10-09: qty 270, 90d supply, fill #0

## 2023-10-11 ENCOUNTER — Other Ambulatory Visit: Payer: Self-pay

## 2023-10-18 ENCOUNTER — Other Ambulatory Visit: Payer: Self-pay

## 2023-10-21 ENCOUNTER — Other Ambulatory Visit: Payer: Self-pay | Admitting: Nurse Practitioner

## 2023-10-21 DIAGNOSIS — R252 Cramp and spasm: Secondary | ICD-10-CM

## 2023-10-23 ENCOUNTER — Other Ambulatory Visit: Payer: Self-pay

## 2023-10-23 MED ORDER — CYCLOBENZAPRINE HCL 10 MG PO TABS
10.0000 mg | ORAL_TABLET | Freq: Three times a day (TID) | ORAL | 0 refills | Status: DC | PRN
  Filled 2023-10-23: qty 30, 10d supply, fill #0

## 2023-10-23 MED ORDER — MONTELUKAST SODIUM 10 MG PO TABS
10.0000 mg | ORAL_TABLET | Freq: Every day | ORAL | 1 refills | Status: AC
  Filled 2023-10-23: qty 90, 90d supply, fill #0
  Filled 2024-01-21: qty 90, 90d supply, fill #1

## 2023-10-24 ENCOUNTER — Other Ambulatory Visit: Payer: Self-pay

## 2023-10-31 ENCOUNTER — Other Ambulatory Visit: Payer: Self-pay

## 2023-11-01 ENCOUNTER — Other Ambulatory Visit: Payer: Self-pay

## 2023-11-11 ENCOUNTER — Other Ambulatory Visit: Payer: Self-pay | Admitting: Nurse Practitioner

## 2023-11-11 DIAGNOSIS — R252 Cramp and spasm: Secondary | ICD-10-CM

## 2023-11-13 ENCOUNTER — Other Ambulatory Visit: Payer: Self-pay

## 2023-11-13 MED ORDER — CYCLOBENZAPRINE HCL 10 MG PO TABS
10.0000 mg | ORAL_TABLET | Freq: Three times a day (TID) | ORAL | 0 refills | Status: DC | PRN
  Filled 2023-11-13: qty 30, 10d supply, fill #0

## 2023-11-15 ENCOUNTER — Other Ambulatory Visit: Payer: Self-pay

## 2023-11-20 ENCOUNTER — Other Ambulatory Visit: Payer: Self-pay | Admitting: Internal Medicine

## 2023-11-20 ENCOUNTER — Other Ambulatory Visit: Payer: Self-pay

## 2023-11-21 ENCOUNTER — Other Ambulatory Visit: Payer: Self-pay

## 2023-11-21 ENCOUNTER — Telehealth: Payer: Self-pay

## 2023-11-21 ENCOUNTER — Other Ambulatory Visit: Payer: Self-pay | Admitting: Internal Medicine

## 2023-11-21 MED ORDER — LEVOTHYROXINE SODIUM 100 MCG PO TABS
100.0000 ug | ORAL_TABLET | Freq: Every day | ORAL | 0 refills | Status: DC
  Filled 2023-11-21: qty 30, 30d supply, fill #0

## 2023-11-21 NOTE — Telephone Encounter (Signed)
 LMx1 to schedule follow up appointment with Dr. Rosalea Collin.

## 2023-11-21 NOTE — Telephone Encounter (Signed)
 Contact patient to schedule office visit. Overdue for follow up

## 2023-11-22 NOTE — Telephone Encounter (Signed)
 Patient is scheduled for 12/11/2023 at 12:10 PM with Dr. Sam.

## 2023-11-24 ENCOUNTER — Other Ambulatory Visit: Payer: Self-pay

## 2023-12-04 ENCOUNTER — Encounter: Payer: Self-pay | Admitting: Nurse Practitioner

## 2023-12-04 ENCOUNTER — Ambulatory Visit (INDEPENDENT_AMBULATORY_CARE_PROVIDER_SITE_OTHER): Payer: Self-pay | Admitting: Nurse Practitioner

## 2023-12-04 ENCOUNTER — Other Ambulatory Visit: Payer: Self-pay

## 2023-12-04 VITALS — BP 116/75 | HR 67 | Temp 97.4°F | Ht 64.0 in | Wt 164.0 lb

## 2023-12-04 DIAGNOSIS — N289 Disorder of kidney and ureter, unspecified: Secondary | ICD-10-CM

## 2023-12-04 DIAGNOSIS — E876 Hypokalemia: Secondary | ICD-10-CM

## 2023-12-04 DIAGNOSIS — R252 Cramp and spasm: Secondary | ICD-10-CM

## 2023-12-04 MED ORDER — CYCLOBENZAPRINE HCL 10 MG PO TABS
10.0000 mg | ORAL_TABLET | Freq: Three times a day (TID) | ORAL | 0 refills | Status: DC | PRN
  Filled 2023-12-04: qty 90, 30d supply, fill #0
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
## 2023-12-05 LAB — COMPREHENSIVE METABOLIC PANEL WITH GFR
ALT: 15 IU/L (ref 0–32)
AST: 16 IU/L (ref 0–40)
Albumin: 4.3 g/dL (ref 3.8–4.9)
Alkaline Phosphatase: 81 IU/L (ref 49–135)
BUN/Creatinine Ratio: 22 (ref 12–28)
BUN: 13 mg/dL (ref 8–27)
Bilirubin Total: 0.4 mg/dL (ref 0.0–1.2)
CO2: 23 mmol/L (ref 20–29)
Calcium: 7.9 mg/dL — ABNORMAL LOW (ref 8.7–10.3)
Chloride: 100 mmol/L (ref 96–106)
Creatinine, Ser: 0.6 mg/dL (ref 0.57–1.00)
Globulin, Total: 2.9 g/dL (ref 1.5–4.5)
Glucose: 88 mg/dL (ref 70–99)
Potassium: 4.2 mmol/L (ref 3.5–5.2)
Sodium: 138 mmol/L (ref 134–144)
Total Protein: 7.2 g/dL (ref 6.0–8.5)
eGFR: 103 mL/min/1.73 (ref >59)

## 2023-12-06 ENCOUNTER — Ambulatory Visit: Payer: Self-pay | Admitting: Nurse Practitioner

## 2023-12-07 ENCOUNTER — Other Ambulatory Visit: Payer: Self-pay

## 2023-12-08 ENCOUNTER — Other Ambulatory Visit: Payer: Self-pay

## 2023-12-11 ENCOUNTER — Ambulatory Visit: Payer: Self-pay | Admitting: Internal Medicine

## 2023-12-11 ENCOUNTER — Encounter: Payer: Self-pay | Admitting: Internal Medicine

## 2023-12-11 NOTE — Progress Notes (Deleted)
 Name: Jennifer Stanton  MRN/ DOB: [id number], 25-Sep-1960    Age/ Sex: 63 y.o., female    PCP: Oley Bascom RAMAN, NP   Reason for Endocrinology Evaluation: Hyperthyroidism     Date of Initial Endocrinology Evaluation: 01/10/2022     HPI: Jennifer Stanton is a 63 y.o. female with a past medical history of Hyperthyroidism. The patient presented for initial endocrinology clinic visit on 01/10/2022 for consultative assistance with her Hyperthyroidism.   Pt has been diagnosed with hyperthyroidism since 2018. Has been on PTU since 2019.  No prior use of methimazole     Thyroid  ultrasound did  NOT show any thyroid  nodules in 2019 and 2020.   No FH of thyroid  disease    NO prior radiation   She is S/P total thyroidectomy 03/18/2022, postoperative course was complicated by hypocalcemia, she received IV calcium  gluconate as well as oral calcium  carbonate and calcitriol    She was discharged on levothyroxine   SUBJECTIVE:    Today (12/11/23): Jennifer Stanton is here for a follow up on postoperative hypothyroidism. She is accompanied with her best friend Olivia   Patient has been noted with weight gain Has noted right neck swelling in 04/2022 , she had an ultrasound through her PCP , has noted an enlargement  Has occasional  palpitations , takes Metoprolol   Has diarrhea or loose stools  Has perioral tingling as well as hands and feet with muscle cramps   No Biotin   Levothyroxine  100 mcg daily She is on Tums      HISTORY:  Past Medical History:  Past Medical History:  Diagnosis Date   Abnormal cells of cervix    Anemia    Anxiety    Blurry vision 06/20/2014   Cephalalgia 06/20/2014   COPD (chronic obstructive pulmonary disease) (HCC)    Grieving 04/2019   Heart palpitations    History of seizure 06/20/2014   Hyperthyroidism    Marijuana use 04/2019   Otitis media    PONV (postoperative nausea and vomiting)    Right ear pain    Seizures (HCC)    Sinus  tachycardia    Thyroid  disease    Past Surgical History:  Past Surgical History:  Procedure Laterality Date   CERVICAL CONE BIOPSY     INNER EAR SURGERY  at age 89   surgey on thumb  02/29/2020   THYROIDECTOMY N/A 03/18/2022   Procedure: TOTAL THYROIDECTOMY;  Surgeon: Eletha Boas, MD;  Location: [facility];  Service: General;  Laterality: N/A;   TUBAL LIGATION  07/05/1991    Social History:  reports that she has been smoking cigarettes. She has a 4.8 pack-year smoking history. She has never used smokeless tobacco. She reports that she does not currently use alcohol. She reports current drug use. Drug: Marijuana. Family History: family history includes Breast cancer (age of onset: 4) in her mother; COPD in her mother; Cancer in her mother; Heart disease in her father; Hemochromatosis in her brother.   HOME MEDICATIONS: Allergies as of 12/11/2023       Reactions   Codeine Rash        Medication List        Accurate as of December 11, 2023  7:35 AM. If you have any questions, ask your nurse or doctor.          albuterol  108 (90 Base) MCG/ACT inhaler Commonly known as: Ventolin  HFA Inhale 2 puffs into the lungs every 4 (four) hours as needed WHEEZING OR SHORTNESS  OF BREATH (COUGH, SHORTNESS OF BREATH OR WHEEZING.).   amitriptyline  50 MG tablet Commonly known as: ELAVIL  Take 1 tablet (50 mg total) by mouth at bedtime.   cephALEXin  500 MG capsule Commonly known as: KEFLEX  Take 1 capsule (500 mg total) by mouth 3 (three) times daily.   cyclobenzaprine  10 MG tablet Commonly known as: FLEXERIL  Take 1 tablet (10 mg total) by mouth 3 (three) times daily as needed for muscle spasms.   levothyroxine  100 MCG tablet Commonly known as: SYNTHROID  Take 1 tablet (100 mcg total) by mouth daily.   metoprolol  tartrate 25 MG tablet Commonly known as: LOPRESSOR  Take 2 tablets (50 mg total) by mouth in the morning AND 1 tablet (25 mg total) every evening.   montelukast  10 MG  tablet Commonly known as: SINGULAIR  Take 1 tablet (10 mg total) by mouth at bedtime.   rosuvastatin  10 MG tablet Commonly known as: Crestor  Take 1 tablet (10 mg total) by mouth daily.          REVIEW OF SYSTEMS: A comprehensive ROS was conducted with the patient and is negative except as per HPI     OBJECTIVE:  VS: There were no vitals taken for this visit.   Wt Readings from Last 3 Encounters:  12/04/23 164 lb (74.4 kg)  09/16/23 180 lb (81.6 kg)  08/07/23 156 lb 6.4 oz (70.9 kg)     EXAM: General: Pt appears well and is in NAD  Neck: General: Supple without adenopathy. Thyroid :  No goiter or nodules appreciated.  Right anterior neck few millimeter lymph node noted, soft and mobile  Lungs: Clear with good BS bilat   Heart: Auscultation: RRR.  Abdomen: Soft, nontender  Extremities:  BL LE: No pretibial edema   Mental Status: Judgment, insight: Intact Orientation: Oriented to time, place, and person Mood and affect: No depression, anxiety, or agitation     DATA REVIEWED:   Latest Reference Range & Units 11/28/22 10:18  Calcium  8.6 - 10.4 mg/dL 7.7 (L) (IP)  Albumin 3.5 - 5.2 g/dL 4.1    Latest Reference Range & Units 11/28/22 10:18  VITD 30.00 - 100.00 ng/mL 32.47    Latest Reference Range & Units 11/28/22 10:18  TSH 0.35 - 5.50 uIU/mL 5.99 (H)  Triiodothyronine (T3) 76 - 181 ng/dL 89  U5,Qmzz(Ipmzru) 9.39 - 1.60 ng/dL 9.14  (H): Data is abnormally high   Latest Reference Range & Units 10/10/22 14:38  Sodium 134 - 144 mmol/L 139  Potassium 3.5 - 5.2 mmol/L 4.2  Chloride 96 - 106 mmol/L 101  CO2 20 - 29 mmol/L 22  Glucose 70 - 99 mg/dL 897 (H)  BUN 6 - 24 mg/dL 12  Creatinine 9.42 - 8.99 mg/dL 9.29  Calcium  8.7 - 10.2 mg/dL 8.0 (L)  BUN/Creatinine Ratio 9 - 23  17  eGFR >59 mL/min/1.73 100  Alkaline Phosphatase 44 - 121 IU/L 86  Albumin 3.8 - 4.9 g/dL 4.5  AST 0 - 40 IU/L 16  ALT 0 - 32 IU/L 11  Total Protein 6.0 - 8.5 g/dL 7.4  Total Bilirubin  0.0 - 1.2 mg/dL 0.3   Thyroid  Ultrasound 10/24/2022   FINDINGS: Sonographic evaluation of patient's palpable area of concern involving the superolateral aspect of the neck demonstrates a well-defined 1.5 x 1.3 x 0.5 cm hypoechoic nodule. This nodule is separate from the adjacent submandibular gland (image 3). While the nodule demonstrates an oval-shape, it does not definitively contain a hilum to suggest a nodule represents a lymph node.  IMPRESSION: Patient's palpable area of concern involving the superolateral aspect of the neck corresponds to a well-defined 1.5 cm hypoechoic nodule. Differential considerations are broad and include but are not limited to a cervical lymph node, ectopic thyroid  tissue versus a parathyroid adenoma. Clinical correlation (such as acquisition of thyroglobulin level) is advised.   Potential imaging strategies could entail a follow-up soft tissue neck ultrasound in 6 weeks to confirm persistence or resolution. Alternatively, further evaluation with contrast-enhanced neck CT could be performed as indicated.   Old records , labs and images have been reviewed.   ASSESSMENT/PLAN/RECOMMENDATIONS:   Postoperative Hypothyroidism:   -Pathology report benign, consistent with Graves' disease -Patient is clinically euthyroid -- Pt educated extensively on the correct way to take levothyroxine  (first thing in the morning with water, 30 minutes before eating or taking other medications). - Pt encouraged to double dose the following day if she were to miss a dose given long half-life of levothyroxine .  -TSH elevated will increase levothyroxine  as below  Medication  Start levothyroxine  100 mcg daily   2. Hypocalcemia   -Patient is symptomatic -Will stop Tums -Patient advised to start calcium  carbonate (Caltrate or Os-Cal D) twice daily(1 tablet with lunch and 1 tablet with supper) -Vitamin D  is normal  Follow-up in 6 months  Signed electronically  by: Stefano Redgie Butts, MD  [facility] Endocrinology  [facility] [address] [place], [place] [address] Phone: [phone] FAX: [phone]   CC: Oley Bascom RAMAN, NP [address] [place] [address] Phone: [phone] Fax: [phone]   Return to Endocrinology clinic as below: Future Appointments  Date Time Provider Department Center  12/11/2023 12:10 PM Arthur Speagle, Donell Redgie, MD [facility] None  02/12/2024  2:20 PM Oley Bascom RAMAN, NP [facility]

## 2023-12-18 ENCOUNTER — Other Ambulatory Visit: Payer: Self-pay | Admitting: Internal Medicine

## 2023-12-19 ENCOUNTER — Other Ambulatory Visit: Payer: Self-pay

## 2023-12-20 ENCOUNTER — Other Ambulatory Visit: Payer: Self-pay | Admitting: Internal Medicine

## 2023-12-20 ENCOUNTER — Other Ambulatory Visit: Payer: Self-pay

## 2023-12-21 ENCOUNTER — Telehealth: Payer: Self-pay

## 2023-12-21 ENCOUNTER — Other Ambulatory Visit: Payer: Self-pay

## 2023-12-21 ENCOUNTER — Other Ambulatory Visit: Payer: Self-pay | Admitting: Internal Medicine

## 2023-12-21 MED ORDER — LEVOTHYROXINE SODIUM 100 MCG PO TABS
100.0000 ug | ORAL_TABLET | Freq: Every day | ORAL | 0 refills | Status: DC
  Filled 2023-12-21: qty 30, 30d supply, fill #0

## 2023-12-21 NOTE — Telephone Encounter (Signed)
 Contact patient for follow up appointment

## 2023-12-25 ENCOUNTER — Encounter: Payer: Self-pay | Admitting: Internal Medicine

## 2023-12-25 ENCOUNTER — Ambulatory Visit: Payer: Self-pay | Admitting: Internal Medicine

## 2023-12-25 NOTE — Progress Notes (Deleted)
 Name: Jennifer Stanton  MRN/ DOB: [id number], [birth year]    Age/ Sex: 63 y.o., female    PCP: Oley Bascom RAMAN, NP   Reason for Endocrinology Evaluation: Hyperthyroidism     Date of Initial Endocrinology Evaluation: 01/10/2022     HPI: [name] is a 63 y.o. female with a past medical history of Hyperthyroidism. The patient presented for initial endocrinology clinic visit on 01/10/2022 for consultative assistance with her Hyperthyroidism.   Pt has been diagnosed with hyperthyroidism since 2018. Has been on PTU since 2019.  No prior use of methimazole     Thyroid  ultrasound did  NOT show any thyroid  nodules in 2019 and 2020.   No FH of thyroid  disease    NO prior radiation   She is S/P total thyroidectomy 03/18/2022, postoperative course was complicated by hypocalcemia, she received IV calcium  gluconate as well as oral calcium  carbonate and calcitriol    She was discharged on levothyroxine   SUBJECTIVE:    Today (12/25/23): Ms. [name] is here for a follow up on postoperative hypothyroidism. She is accompanied with her best friend Jennifer Stanton    She continues to complain of a right neck mass, thyroid  ultrasound in October, 2024 demonstrated an ovoid hypoechoic mass measuring 1.6 cm, it was described as a 9 enlarged lymph node. Patient has been noted with weight gain Has noted right neck swelling in 04/2022 , she had an ultrasound through her PCP , has noted an enlargement  Has occasional  palpitations , takes Metoprolol   Has diarrhea or loose stools  Has perioral tingling as well as hands and feet with muscle cramps   No Biotin   Levothyroxine  100 mcg daily Calcium  carbonate 500 mg twice daily     HISTORY:  Past Medical History:  Past Medical History:  Diagnosis Date   Abnormal cells of cervix    Anemia    Anxiety    Blurry vision 06/20/2014   Cephalalgia 06/20/2014   COPD (chronic obstructive pulmonary disease) (HCC)    Grieving 04/2019    Heart palpitations    History of seizure 06/20/2014   Hyperthyroidism    Marijuana use 04/2019   Otitis media    PONV (postoperative nausea and vomiting)    Right ear pain    Seizures (HCC)    Sinus tachycardia    Thyroid  disease    Past Surgical History:  Past Surgical History:  Procedure Laterality Date   CERVICAL CONE BIOPSY     INNER EAR SURGERY  at age 43   surgey on thumb  02/29/2020   THYROIDECTOMY N/A 03/18/2022   Procedure: TOTAL THYROIDECTOMY;  Surgeon: Eletha Boas, MD;  Location: [facility];  Service: General;  Laterality: N/A;   TUBAL LIGATION  07/05/1991    Social History:  reports that she has been smoking cigarettes. She has a 4.8 pack-year smoking history. She has never used smokeless tobacco. She reports that she does not currently use alcohol. She reports current drug use. Drug: Marijuana. Family History: family history includes Breast cancer (age of onset: 29) in her mother; COPD in her mother; Cancer in her mother; Heart disease in her father; Hemochromatosis in her brother.   HOME MEDICATIONS: Allergies as of 12/25/2023       Reactions   Codeine Rash        Medication List        Accurate as of December 25, 2023  7:12 AM. If you have any questions, ask your nurse or doctor.  albuterol  108 (90 Base) MCG/ACT inhaler Commonly known as: Ventolin  HFA Inhale 2 puffs into the lungs every 4 (four) hours as needed WHEEZING OR SHORTNESS OF BREATH (COUGH, SHORTNESS OF BREATH OR WHEEZING.).   amitriptyline  50 MG tablet Commonly known as: ELAVIL  Take 1 tablet (50 mg total) by mouth at bedtime.   cephALEXin  500 MG capsule Commonly known as: KEFLEX  Take 1 capsule (500 mg total) by mouth 3 (three) times daily.   cyclobenzaprine  10 MG tablet Commonly known as: FLEXERIL  Take 1 tablet (10 mg total) by mouth 3 (three) times daily as needed for muscle spasms.   levothyroxine  100 MCG tablet Commonly known as: SYNTHROID  Take 1 tablet (100 mcg total) by  mouth daily.   metoprolol  tartrate 25 MG tablet Commonly known as: LOPRESSOR  Take 2 tablets (50 mg total) by mouth in the morning AND 1 tablet (25 mg total) every evening.   montelukast  10 MG tablet Commonly known as: SINGULAIR  Take 1 tablet (10 mg total) by mouth at bedtime.   rosuvastatin  10 MG tablet Commonly known as: Crestor  Take 1 tablet (10 mg total) by mouth daily.          REVIEW OF SYSTEMS: A comprehensive ROS was conducted with the patient and is negative except as per HPI     OBJECTIVE:  VS: There were no vitals taken for this visit.   Wt Readings from Last 3 Encounters:  12/04/23 164 lb (74.4 kg)  09/16/23 180 lb (81.6 kg)  08/07/23 156 lb 6.4 oz (70.9 kg)     EXAM: General: Pt appears well and is in NAD  Neck: General: Supple without adenopathy. Thyroid :  No goiter or nodules appreciated.  Right anterior neck few millimeter lymph node noted, soft and mobile  Lungs: Clear with good BS bilat   Heart: Auscultation: RRR.  Abdomen: Soft, nontender  Extremities:  BL LE: No pretibial edema   Mental Status: Judgment, insight: Intact Orientation: Oriented to time, place, and person Mood and affect: No depression, anxiety, or agitation     DATA REVIEWED:   Latest Reference Range & Units 11/28/22 10:18  Calcium  8.6 - 10.4 mg/dL 7.7 (L) (IP)  Albumin 3.5 - 5.2 g/dL 4.1    Latest Reference Range & Units 11/28/22 10:18  VITD 30.00 - 100.00 ng/mL 32.47    Latest Reference Range & Units 11/28/22 10:18  TSH 0.35 - 5.50 uIU/mL 5.99 (H)  Triiodothyronine (T3) 76 - 181 ng/dL 89  U5,Qmzz(Ipmzru) 9.39 - 1.60 ng/dL 9.14  (H): Data is abnormally high   Latest Reference Range & Units 10/10/22 14:38  Sodium 134 - 144 mmol/L 139  Potassium 3.5 - 5.2 mmol/L 4.2  Chloride 96 - 106 mmol/L 101  CO2 20 - 29 mmol/L 22  Glucose 70 - 99 mg/dL 897 (H)  BUN 6 - 24 mg/dL 12  Creatinine 9.42 - 8.99 mg/dL 9.29  Calcium  8.7 - 10.2 mg/dL 8.0 (L)  BUN/Creatinine Ratio 9 -  23  17  eGFR >59 mL/min/1.73 100  Alkaline Phosphatase 44 - 121 IU/L 86  Albumin 3.8 - 4.9 g/dL 4.5  AST 0 - 40 IU/L 16  ALT 0 - 32 IU/L 11  Total Protein 6.0 - 8.5 g/dL 7.4  Total Bilirubin 0.0 - 1.2 mg/dL 0.3   Thyroid  Ultrasound 10/24/2022   FINDINGS: Sonographic evaluation of patient's palpable area of concern involving the superolateral aspect of the neck demonstrates a well-defined 1.5 x 1.3 x 0.5 cm hypoechoic nodule. This nodule is separate from the adjacent  submandibular gland (image 3). While the nodule demonstrates an oval-shape, it does not definitively contain a hilum to suggest a nodule represents a lymph node.   IMPRESSION: Patient's palpable area of concern involving the superolateral aspect of the neck corresponds to a well-defined 1.5 cm hypoechoic nodule. Differential considerations are broad and include but are not limited to a cervical lymph node, ectopic thyroid  tissue versus a parathyroid adenoma. Clinical correlation (such as acquisition of thyroglobulin level) is advised.   Potential imaging strategies could entail a follow-up soft tissue neck ultrasound in 6 weeks to confirm persistence or resolution. Alternatively, further evaluation with contrast-enhanced neck CT could be performed as indicated.   Old records , labs and images have been reviewed.   ASSESSMENT/PLAN/RECOMMENDATIONS:   Postoperative Hypothyroidism:   -Pathology report benign, consistent with Graves' disease -Patient is clinically euthyroid -- Pt educated extensively on the correct way to take levothyroxine  (first thing in the morning with water, 30 minutes before eating or taking other medications). - Pt encouraged to double dose the following day if she were to miss a dose given long half-life of levothyroxine .  -TSH elevated will increase levothyroxine  as below  Medication  levothyroxine  100 mcg daily   2. Hypocalcemia   -Patient is symptomatic  -Patient advised to  start calcium  carbonate (Caltrate or Os-Cal D) twice daily(1 tablet with lunch and 1 tablet with supper) -Vitamin D  is normal  Follow-up in 6 months  Signed electronically by: Stefano Redgie Butts, MD  [facility] Endocrinology  [facility] [address] [place] [place], [place] [address] Phone: [phone] FAX: [phone]   CC: Oley Bascom RAMAN, NP [address] [place] [address] Phone: [phone] Fax: [phone]   Return to Endocrinology clinic as below: Future Appointments  Date Time Provider Department Center  12/25/2023  9:30 AM Shimon Trowbridge, Donell Redgie, MD [facility] None  02/12/2024  2:20 PM Oley Bascom RAMAN, NP [facility]

## 2024-01-04 ENCOUNTER — Other Ambulatory Visit: Payer: Self-pay

## 2024-01-04 ENCOUNTER — Other Ambulatory Visit: Payer: Self-pay | Admitting: Nurse Practitioner

## 2024-01-04 MED ORDER — METOPROLOL TARTRATE 25 MG PO TABS
ORAL_TABLET | ORAL | 0 refills | Status: AC
  Filled 2024-01-04 – 2024-01-16 (×2): qty 270, 90d supply, fill #0

## 2024-01-12 ENCOUNTER — Other Ambulatory Visit: Payer: Self-pay

## 2024-01-15 ENCOUNTER — Other Ambulatory Visit: Payer: Self-pay

## 2024-01-16 ENCOUNTER — Other Ambulatory Visit: Payer: Self-pay | Admitting: Internal Medicine

## 2024-01-16 ENCOUNTER — Other Ambulatory Visit: Payer: Self-pay

## 2024-01-16 MED ORDER — LEVOTHYROXINE SODIUM 100 MCG PO TABS
100.0000 ug | ORAL_TABLET | Freq: Every day | ORAL | 0 refills | Status: DC
  Filled 2024-01-16: qty 30, 30d supply, fill #0

## 2024-01-19 ENCOUNTER — Other Ambulatory Visit: Payer: Self-pay

## 2024-01-22 ENCOUNTER — Other Ambulatory Visit: Payer: Self-pay

## 2024-01-23 ENCOUNTER — Other Ambulatory Visit: Payer: Self-pay

## 2024-01-27 ENCOUNTER — Other Ambulatory Visit: Payer: Self-pay | Admitting: Nurse Practitioner

## 2024-01-27 DIAGNOSIS — R252 Cramp and spasm: Secondary | ICD-10-CM

## 2024-01-30 NOTE — Telephone Encounter (Signed)
 Please advise [facility]

## 2024-02-03 ENCOUNTER — Other Ambulatory Visit: Payer: Self-pay

## 2024-02-03 MED ORDER — CYCLOBENZAPRINE HCL 10 MG PO TABS
10.0000 mg | ORAL_TABLET | Freq: Three times a day (TID) | ORAL | 0 refills | Status: AC | PRN
  Filled 2024-02-03 – 2024-02-25 (×2): qty 90, 30d supply, fill #0

## 2024-02-05 ENCOUNTER — Other Ambulatory Visit: Payer: Self-pay

## 2024-02-12 ENCOUNTER — Ambulatory Visit: Payer: Self-pay | Admitting: Nurse Practitioner

## 2024-02-13 ENCOUNTER — Other Ambulatory Visit: Payer: Self-pay

## 2024-02-14 ENCOUNTER — Other Ambulatory Visit: Payer: Self-pay

## 2024-02-15 ENCOUNTER — Other Ambulatory Visit: Payer: Self-pay | Admitting: Internal Medicine

## 2024-02-19 ENCOUNTER — Other Ambulatory Visit: Payer: Self-pay

## 2024-02-23 ENCOUNTER — Other Ambulatory Visit: Payer: Self-pay

## 2024-02-23 ENCOUNTER — Other Ambulatory Visit: Payer: Self-pay | Admitting: Internal Medicine

## 2024-02-26 ENCOUNTER — Other Ambulatory Visit: Payer: Self-pay

## 2024-02-26 ENCOUNTER — Other Ambulatory Visit: Payer: Self-pay | Admitting: Internal Medicine

## 2024-02-27 ENCOUNTER — Other Ambulatory Visit: Payer: Self-pay | Admitting: Internal Medicine

## 2024-02-27 ENCOUNTER — Other Ambulatory Visit: Payer: Self-pay | Admitting: Nurse Practitioner

## 2024-02-27 ENCOUNTER — Other Ambulatory Visit: Payer: Self-pay

## 2024-02-27 MED ORDER — LEVOTHYROXINE SODIUM 100 MCG PO TABS
100.0000 ug | ORAL_TABLET | Freq: Every day | ORAL | 0 refills | Status: DC
  Filled 2024-02-27: qty 30, 30d supply, fill #0

## 2024-02-27 NOTE — Telephone Encounter (Signed)
 Copied from CRM [id number]. Topic: Clinical - Medication Refill >> Feb 27, 2024  9:50 AM Berwyn MATSU wrote: Medication: levothyroxine  (SYNTHROID ) 100 MCG tablet   Has the patient contacted their pharmacy? Yes (Agent: If no, request that the patient contact the pharmacy for the refill. If patient does not wish to contact the pharmacy document the reason why and proceed with request.) (Agent: If yes, when and what did the pharmacy advise?)  This is the patient's preferred pharmacy:  [facility] [address] [place] [address] Phone: [phone] Fax: [phone]  Is this the correct pharmacy for this prescription? Yes If no, delete pharmacy and type the correct one.   Has the prescription been filled recently? Yes  Is the patient out of the medication? Yes almost 1 week ; patient was discharged from endo and needs to be referred to a new provider.   Has the patient been seen for an appointment in the last year OR does the patient have an upcoming appointment? Yes patient is scheduled on 04/01/24 due to her availability can only come in on Mondays.   Can we respond through MyChart? Yes  Agent: Please be advised that Rx refills may take up to 3 business days. We ask that you follow-up with your pharmacy.

## 2024-03-21 ENCOUNTER — Other Ambulatory Visit: Payer: Self-pay

## 2024-03-21 ENCOUNTER — Other Ambulatory Visit: Payer: Self-pay | Admitting: Nurse Practitioner

## 2024-03-21 MED ORDER — LEVOTHYROXINE SODIUM 100 MCG PO TABS
100.0000 ug | ORAL_TABLET | Freq: Every day | ORAL | 0 refills | Status: AC
  Filled 2024-03-21: qty 30, 30d supply, fill #0

## 2024-03-21 NOTE — Telephone Encounter (Signed)
levothyroxine (SYNTHROID) 100 MCG tablet

## 2024-03-21 NOTE — Telephone Encounter (Signed)
 Please advise on refill. Per last prescription it says that patient has been dismissed from practice. No current letter in chart stating dismal.

## 2024-03-22 ENCOUNTER — Other Ambulatory Visit: Payer: Self-pay

## 2024-04-01 ENCOUNTER — Ambulatory Visit: Payer: Self-pay | Admitting: Nurse Practitioner

## 2024-04-22 ENCOUNTER — Ambulatory Visit: Payer: Self-pay | Admitting: Nurse Practitioner
